# Patient Record
Sex: Male | Born: 1940 | Race: Black or African American | Hispanic: No | Smoking: Former smoker
Health system: Southern US, Community
[De-identification: ages and names within clinical notes are randomized; demographics above are authoritative.]

## PROBLEM LIST (undated history)

## (undated) DIAGNOSIS — R569 Unspecified convulsions: Secondary | ICD-10-CM

## (undated) DIAGNOSIS — J449 Chronic obstructive pulmonary disease, unspecified: Secondary | ICD-10-CM

## (undated) DIAGNOSIS — C8590 Non-Hodgkin lymphoma, unspecified, unspecified site: Secondary | ICD-10-CM

## (undated) DIAGNOSIS — M109 Gout, unspecified: Secondary | ICD-10-CM

## (undated) DIAGNOSIS — R972 Elevated prostate specific antigen [PSA]: Secondary | ICD-10-CM

## (undated) DIAGNOSIS — N529 Male erectile dysfunction, unspecified: Secondary | ICD-10-CM

## (undated) DIAGNOSIS — I10 Essential (primary) hypertension: Secondary | ICD-10-CM

## (undated) DIAGNOSIS — C801 Malignant (primary) neoplasm, unspecified: Secondary | ICD-10-CM

## (undated) DIAGNOSIS — J45909 Unspecified asthma, uncomplicated: Secondary | ICD-10-CM

## (undated) DIAGNOSIS — N400 Enlarged prostate without lower urinary tract symptoms: Secondary | ICD-10-CM

## (undated) HISTORY — DX: Elevated prostate specific antigen (PSA): R97.20

## (undated) HISTORY — PX: THORACENTESIS: SHX235

## (undated) HISTORY — DX: Chronic obstructive pulmonary disease, unspecified: J44.9

## (undated) HISTORY — DX: Gout, unspecified: M10.9

## (undated) HISTORY — DX: Non-Hodgkin lymphoma, unspecified, unspecified site: C85.90

## (undated) HISTORY — DX: Benign prostatic hyperplasia without lower urinary tract symptoms: N40.0

## (undated) HISTORY — PX: CYSTOSCOPY: SUR368

## (undated) HISTORY — DX: Unspecified convulsions: R56.9

## (undated) HISTORY — DX: Male erectile dysfunction, unspecified: N52.9

## (undated) HISTORY — PX: OTHER SURGICAL HISTORY: SHX169

---

## 2006-07-26 ENCOUNTER — Ambulatory Visit: Payer: Self-pay | Admitting: Internal Medicine

## 2006-07-28 ENCOUNTER — Ambulatory Visit: Payer: Self-pay | Admitting: Internal Medicine

## 2006-08-04 ENCOUNTER — Ambulatory Visit: Payer: Self-pay | Admitting: Internal Medicine

## 2006-08-07 ENCOUNTER — Ambulatory Visit: Payer: Self-pay | Admitting: Internal Medicine

## 2006-08-19 ENCOUNTER — Ambulatory Visit: Payer: Self-pay | Admitting: General Surgery

## 2006-08-19 ENCOUNTER — Other Ambulatory Visit: Payer: Self-pay

## 2006-08-21 ENCOUNTER — Ambulatory Visit: Payer: Self-pay | Admitting: General Surgery

## 2006-08-25 ENCOUNTER — Ambulatory Visit: Payer: Self-pay | Admitting: Internal Medicine

## 2006-09-25 ENCOUNTER — Ambulatory Visit: Payer: Self-pay | Admitting: Internal Medicine

## 2006-11-20 ENCOUNTER — Ambulatory Visit: Payer: Self-pay | Admitting: Internal Medicine

## 2009-03-22 ENCOUNTER — Ambulatory Visit: Payer: Self-pay | Admitting: Internal Medicine

## 2009-03-27 ENCOUNTER — Ambulatory Visit: Payer: Self-pay | Admitting: Internal Medicine

## 2009-03-29 ENCOUNTER — Ambulatory Visit: Payer: Self-pay | Admitting: Internal Medicine

## 2009-03-30 ENCOUNTER — Ambulatory Visit: Payer: Self-pay | Admitting: Internal Medicine

## 2009-04-10 ENCOUNTER — Ambulatory Visit: Payer: Self-pay | Admitting: General Surgery

## 2009-04-10 ENCOUNTER — Ambulatory Visit: Payer: Self-pay | Admitting: Internal Medicine

## 2009-04-11 ENCOUNTER — Ambulatory Visit: Payer: Self-pay | Admitting: General Surgery

## 2009-04-12 ENCOUNTER — Ambulatory Visit: Payer: Self-pay | Admitting: Internal Medicine

## 2009-04-12 ENCOUNTER — Ambulatory Visit: Payer: Self-pay | Admitting: General Surgery

## 2009-04-13 ENCOUNTER — Inpatient Hospital Stay: Payer: Self-pay | Admitting: Internal Medicine

## 2009-04-24 ENCOUNTER — Ambulatory Visit: Payer: Self-pay | Admitting: Internal Medicine

## 2009-05-25 ENCOUNTER — Ambulatory Visit: Payer: Self-pay | Admitting: Internal Medicine

## 2009-06-22 ENCOUNTER — Ambulatory Visit: Payer: Self-pay | Admitting: Internal Medicine

## 2009-06-24 ENCOUNTER — Ambulatory Visit: Payer: Self-pay | Admitting: Internal Medicine

## 2009-07-25 ENCOUNTER — Ambulatory Visit: Payer: Self-pay | Admitting: Internal Medicine

## 2009-08-24 ENCOUNTER — Ambulatory Visit: Payer: Self-pay | Admitting: Internal Medicine

## 2009-08-31 ENCOUNTER — Ambulatory Visit: Payer: Self-pay | Admitting: Internal Medicine

## 2009-09-24 ENCOUNTER — Ambulatory Visit: Payer: Self-pay | Admitting: Internal Medicine

## 2009-10-25 ENCOUNTER — Ambulatory Visit: Payer: Self-pay | Admitting: Internal Medicine

## 2009-11-24 ENCOUNTER — Ambulatory Visit: Payer: Self-pay | Admitting: Internal Medicine

## 2009-12-03 ENCOUNTER — Ambulatory Visit: Payer: Self-pay | Admitting: Internal Medicine

## 2009-12-25 ENCOUNTER — Ambulatory Visit: Payer: Self-pay | Admitting: Internal Medicine

## 2010-02-06 ENCOUNTER — Ambulatory Visit: Payer: Self-pay | Admitting: Internal Medicine

## 2010-02-24 ENCOUNTER — Ambulatory Visit: Payer: Self-pay | Admitting: Internal Medicine

## 2010-04-03 ENCOUNTER — Ambulatory Visit: Payer: Self-pay | Admitting: Internal Medicine

## 2010-04-25 ENCOUNTER — Ambulatory Visit: Payer: Self-pay | Admitting: Internal Medicine

## 2010-05-29 ENCOUNTER — Ambulatory Visit: Payer: Self-pay | Admitting: Internal Medicine

## 2010-06-04 ENCOUNTER — Ambulatory Visit: Payer: Self-pay | Admitting: Internal Medicine

## 2010-06-25 ENCOUNTER — Ambulatory Visit: Payer: Self-pay | Admitting: Internal Medicine

## 2010-07-26 ENCOUNTER — Ambulatory Visit: Payer: Self-pay | Admitting: Internal Medicine

## 2010-09-26 ENCOUNTER — Ambulatory Visit: Payer: Self-pay | Admitting: Internal Medicine

## 2010-10-26 ENCOUNTER — Ambulatory Visit: Payer: Self-pay | Admitting: Internal Medicine

## 2010-11-25 ENCOUNTER — Ambulatory Visit: Payer: Self-pay | Admitting: Internal Medicine

## 2011-01-21 ENCOUNTER — Ambulatory Visit: Payer: Self-pay | Admitting: Internal Medicine

## 2011-01-25 ENCOUNTER — Ambulatory Visit: Payer: Self-pay | Admitting: Internal Medicine

## 2011-03-19 ENCOUNTER — Ambulatory Visit: Payer: Self-pay | Admitting: Internal Medicine

## 2011-03-19 LAB — CBC CANCER CENTER
Basophil #: 0.1 x10 3/mm (ref 0.0–0.1)
Eosinophil #: 0.4 x10 3/mm (ref 0.0–0.7)
HCT: 43 % (ref 40.0–52.0)
Lymphocyte #: 1.2 x10 3/mm (ref 1.0–3.6)
Lymphocyte %: 14.3 %
MCHC: 33.6 g/dL (ref 32.0–36.0)
MCV: 93 fL (ref 80–100)
Monocyte %: 9.5 %
Neutrophil #: 5.9 x10 3/mm (ref 1.4–6.5)
Platelet: 197 x10 3/mm (ref 150–440)
RBC: 4.64 10*6/uL (ref 4.40–5.90)
RDW: 14.7 % — ABNORMAL HIGH (ref 11.5–14.5)

## 2011-03-19 LAB — COMPREHENSIVE METABOLIC PANEL
Albumin: 3.6 g/dL (ref 3.4–5.0)
Alkaline Phosphatase: 102 U/L (ref 50–136)
BUN: 9 mg/dL (ref 7–18)
Calcium, Total: 8.7 mg/dL (ref 8.5–10.1)
Glucose: 117 mg/dL — ABNORMAL HIGH (ref 65–99)
Potassium: 2.7 mmol/L — ABNORMAL LOW (ref 3.5–5.1)
SGOT(AST): 19 U/L (ref 15–37)
SGPT (ALT): 29 U/L
Total Protein: 7 g/dL (ref 6.4–8.2)

## 2011-03-19 LAB — LACTATE DEHYDROGENASE: LDH: 146 U/L (ref 87–241)

## 2011-03-26 LAB — POTASSIUM: Potassium: 4 mmol/L (ref 3.5–5.1)

## 2011-03-28 ENCOUNTER — Ambulatory Visit: Payer: Self-pay | Admitting: Internal Medicine

## 2011-04-01 ENCOUNTER — Other Ambulatory Visit: Payer: Self-pay | Admitting: Gastroenterology

## 2011-04-02 LAB — CLOSTRIDIUM DIFFICILE BY PCR

## 2011-04-04 LAB — STOOL CULTURE

## 2011-04-05 LAB — WBCS, STOOL

## 2011-04-21 ENCOUNTER — Ambulatory Visit: Payer: Self-pay | Admitting: Gastroenterology

## 2011-05-14 ENCOUNTER — Ambulatory Visit: Payer: Self-pay | Admitting: Internal Medicine

## 2011-05-14 LAB — COMPREHENSIVE METABOLIC PANEL
Alkaline Phosphatase: 107 U/L (ref 50–136)
Anion Gap: 9 (ref 7–16)
Calcium, Total: 8.4 mg/dL — ABNORMAL LOW (ref 8.5–10.1)
Co2: 28 mmol/L (ref 21–32)
Creatinine: 1.37 mg/dL — ABNORMAL HIGH (ref 0.60–1.30)
EGFR (African American): >60
Osmolality: 285 (ref 275–301)
Sodium: 144 mmol/L (ref 136–145)
Total Protein: 7 g/dL (ref 6.4–8.2)

## 2011-05-14 LAB — CBC CANCER CENTER
Basophil %: 0.3 %
HGB: 13.4 g/dL (ref 13.0–18.0)
Lymphocyte #: 1 x10 3/mm (ref 1.0–3.6)
Lymphocyte %: 12.3 %
MCH: 31 pg (ref 26.0–34.0)
MCHC: 33.2 g/dL (ref 32.0–36.0)
MCV: 93 fL (ref 80–100)
Monocyte %: 12.1 %
Neutrophil #: 5.7 x10 3/mm (ref 1.4–6.5)
Platelet: 183 x10 3/mm (ref 150–440)
RBC: 4.32 10*6/uL — ABNORMAL LOW (ref 4.40–5.90)

## 2011-05-19 ENCOUNTER — Ambulatory Visit: Payer: Self-pay | Admitting: Internal Medicine

## 2011-05-21 LAB — POTASSIUM: Potassium: 3 mmol/L — ABNORMAL LOW (ref 3.5–5.1)

## 2011-05-26 ENCOUNTER — Ambulatory Visit: Payer: Self-pay | Admitting: Internal Medicine

## 2011-05-26 LAB — CREATININE, SERUM: Creatinine: 1.33 mg/dL — ABNORMAL HIGH (ref 0.60–1.30)

## 2011-05-27 LAB — PSA: PSA: 5 ng/mL — ABNORMAL HIGH (ref 0.0–4.0)

## 2011-06-25 ENCOUNTER — Ambulatory Visit: Payer: Self-pay | Admitting: Internal Medicine

## 2011-07-16 LAB — CREATININE, SERUM
Creatinine: 1.37 mg/dL — ABNORMAL HIGH (ref 0.60–1.30)
EGFR (African American): 59 — ABNORMAL LOW
EGFR (Non-African Amer.): 51 — ABNORMAL LOW

## 2011-07-16 LAB — CBC CANCER CENTER
Basophil %: 0.2 %
Eosinophil %: 2.9 %
HCT: 43 % (ref 40.0–52.0)
HGB: 14.1 g/dL (ref 13.0–18.0)
Lymphocyte #: 1.1 x10 3/mm (ref 1.0–3.6)
Lymphocyte %: 13.2 %
MCV: 94 fL (ref 80–100)
Monocyte %: 7.1 %
Neutrophil %: 76.6 %
RDW: 14 % (ref 11.5–14.5)

## 2011-07-16 LAB — LACTATE DEHYDROGENASE: LDH: 139 U/L (ref 87–241)

## 2011-07-26 ENCOUNTER — Ambulatory Visit: Payer: Self-pay | Admitting: Internal Medicine

## 2011-09-10 ENCOUNTER — Ambulatory Visit: Payer: Self-pay | Admitting: Internal Medicine

## 2011-09-10 LAB — CBC CANCER CENTER
Basophil #: 0 x10 3/mm (ref 0.0–0.1)
Eosinophil #: 0.5 x10 3/mm (ref 0.0–0.7)
Eosinophil %: 4.9 %
MCH: 30.6 pg (ref 26.0–34.0)
MCV: 95 fL (ref 80–100)
Monocyte #: 1 x10 3/mm (ref 0.2–1.0)
Neutrophil %: 70.5 %
Platelet: 176 x10 3/mm (ref 150–440)
RBC: 4.7 10*6/uL (ref 4.40–5.90)
RDW: 14.3 % (ref 11.5–14.5)

## 2011-09-10 LAB — URIC ACID: Uric Acid: 5.3 mg/dL (ref 3.5–7.2)

## 2011-09-10 LAB — CREATININE, SERUM: EGFR (Non-African Amer.): 49 — ABNORMAL LOW

## 2011-09-25 ENCOUNTER — Ambulatory Visit: Payer: Self-pay | Admitting: Internal Medicine

## 2011-11-12 ENCOUNTER — Ambulatory Visit: Payer: Self-pay | Admitting: Internal Medicine

## 2011-11-12 LAB — CBC CANCER CENTER
Basophil #: 0 x10 3/mm (ref 0.0–0.1)
Eosinophil #: 0.6 x10 3/mm (ref 0.0–0.7)
HCT: 42 % (ref 40.0–52.0)
Lymphocyte #: 1.1 x10 3/mm (ref 1.0–3.6)
MCH: 30.7 pg (ref 26.0–34.0)
MCV: 94 fL (ref 80–100)
Monocyte #: 0.7 x10 3/mm (ref 0.2–1.0)
Monocyte %: 8.5 %
Platelet: 154 x10 3/mm (ref 150–440)
RDW: 13.6 % (ref 11.5–14.5)
WBC: 8.3 x10 3/mm (ref 3.8–10.6)

## 2011-11-12 LAB — TSH: Thyroid Stimulating Horm: 0.89 u[IU]/mL

## 2011-11-12 LAB — CREATININE, SERUM
Creatinine: 1.45 mg/dL — ABNORMAL HIGH (ref 0.60–1.30)
EGFR (African American): 55 — ABNORMAL LOW
EGFR (Non-African Amer.): 48 — ABNORMAL LOW

## 2011-11-12 LAB — HEPATIC FUNCTION PANEL A (ARMC)
Albumin: 3.7 g/dL (ref 3.4–5.0)
Bilirubin, Direct: 0.2 mg/dL (ref 0.00–0.20)
SGOT(AST): 28 U/L (ref 15–37)

## 2011-11-12 LAB — GLUCOSE, RANDOM: Glucose: 102 mg/dL — ABNORMAL HIGH (ref 65–99)

## 2011-11-12 LAB — HEMOGLOBIN A1C: Hemoglobin A1C: 5.4 % (ref 4.2–6.3)

## 2011-11-17 ENCOUNTER — Ambulatory Visit: Payer: Self-pay | Admitting: Internal Medicine

## 2011-11-25 ENCOUNTER — Ambulatory Visit: Payer: Self-pay | Admitting: Internal Medicine

## 2011-12-26 ENCOUNTER — Ambulatory Visit: Payer: Self-pay | Admitting: Internal Medicine

## 2012-01-23 ENCOUNTER — Emergency Department: Payer: Self-pay | Admitting: Emergency Medicine

## 2012-02-04 ENCOUNTER — Ambulatory Visit: Payer: Self-pay | Admitting: Internal Medicine

## 2012-02-04 LAB — CBC CANCER CENTER
Basophil #: 0 x10 3/mm (ref 0.0–0.1)
Eosinophil #: 0.2 x10 3/mm (ref 0.0–0.7)
Eosinophil %: 1.9 %
HCT: 40.6 % (ref 40.0–52.0)
HGB: 14.1 g/dL (ref 13.0–18.0)
Lymphocyte %: 15.3 %
MCHC: 34.8 g/dL (ref 32.0–36.0)
MCV: 92 fL (ref 80–100)
Neutrophil #: 6.2 x10 3/mm (ref 1.4–6.5)
Neutrophil %: 72.6 %
Platelet: 166 x10 3/mm (ref 150–440)
RBC: 4.41 10*6/uL (ref 4.40–5.90)
RDW: 13 % (ref 11.5–14.5)
WBC: 8.5 x10 3/mm (ref 3.8–10.6)

## 2012-02-04 LAB — CREATININE, SERUM
Creatinine: 1.6 mg/dL — ABNORMAL HIGH (ref 0.60–1.30)
EGFR (African American): 49 — ABNORMAL LOW
EGFR (Non-African Amer.): 42 — ABNORMAL LOW

## 2012-02-04 LAB — URIC ACID: Uric Acid: 7.2 mg/dL (ref 3.5–7.2)

## 2012-02-25 ENCOUNTER — Ambulatory Visit: Payer: Self-pay | Admitting: Internal Medicine

## 2012-03-27 ENCOUNTER — Ambulatory Visit: Payer: Self-pay | Admitting: Internal Medicine

## 2012-03-31 LAB — CREATININE, SERUM
Creatinine: 0.97 mg/dL (ref 0.60–1.30)
EGFR (African American): >60
EGFR (Non-African Amer.): >60

## 2012-03-31 LAB — CBC CANCER CENTER
Basophil %: 0.4 %
Eosinophil #: 0 x10 3/mm (ref 0.0–0.7)
HCT: 37.6 % — ABNORMAL LOW (ref 40.0–52.0)
HGB: 12.6 g/dL — ABNORMAL LOW (ref 13.0–18.0)
Lymphocyte #: 1.7 x10 3/mm (ref 1.0–3.6)
MCH: 31.2 pg (ref 26.0–34.0)
MCV: 93 fL (ref 80–100)
Monocyte %: 11.7 %
Neutrophil #: 5.8 x10 3/mm (ref 1.4–6.5)
Neutrophil %: 68.1 %
Platelet: 255 x10 3/mm (ref 150–440)
RBC: 4.04 10*6/uL — ABNORMAL LOW (ref 4.40–5.90)
RDW: 14.5 % (ref 11.5–14.5)

## 2012-03-31 LAB — LACTATE DEHYDROGENASE: LDH: 182 U/L (ref 85–241)

## 2012-04-14 LAB — IRON AND TIBC
Iron Bind.Cap.(Total): 315 ug/dL (ref 250–450)
Iron Saturation: 33 %
Unbound Iron-Bind.Cap.: 212 ug/dL

## 2012-04-14 LAB — CREATININE, SERUM
EGFR (African American): >60
EGFR (Non-African Amer.): 57 — ABNORMAL LOW

## 2012-04-24 ENCOUNTER — Ambulatory Visit: Payer: Self-pay | Admitting: Internal Medicine

## 2012-05-25 ENCOUNTER — Ambulatory Visit: Payer: Self-pay | Admitting: Internal Medicine

## 2012-05-26 LAB — CBC CANCER CENTER
Basophil #: 0 x10 3/mm (ref 0.0–0.1)
Basophil %: 0.4 %
Eosinophil %: 4.3 %
HCT: 43.2 % (ref 40.0–52.0)
HGB: 14.2 g/dL (ref 13.0–18.0)
MCHC: 32.8 g/dL (ref 32.0–36.0)
MCV: 92 fL (ref 80–100)
Monocyte %: 8.5 %
Neutrophil #: 6 x10 3/mm (ref 1.4–6.5)
WBC: 8.8 x10 3/mm (ref 3.8–10.6)

## 2012-05-26 LAB — POTASSIUM: Potassium: 4 mmol/L (ref 3.5–5.1)

## 2012-05-26 LAB — LACTATE DEHYDROGENASE: LDH: 148 U/L (ref 85–241)

## 2012-05-26 LAB — CREATININE, SERUM
Creatinine: 1.49 mg/dL — ABNORMAL HIGH (ref 0.60–1.30)
EGFR (African American): 53 — ABNORMAL LOW
EGFR (Non-African Amer.): 46 — ABNORMAL LOW

## 2012-06-24 ENCOUNTER — Ambulatory Visit: Payer: Self-pay | Admitting: Internal Medicine

## 2012-06-25 LAB — CREATININE, SERUM: EGFR (Non-African Amer.): >60

## 2012-06-25 LAB — CANCER CTR PLATELET CT: Platelet: 172 x10 3/mm (ref 150–440)

## 2012-06-26 LAB — PSA: PSA: 3.1 ng/mL (ref 0.0–4.0)

## 2012-07-15 ENCOUNTER — Ambulatory Visit: Payer: Self-pay | Admitting: Physician Assistant

## 2012-07-25 ENCOUNTER — Ambulatory Visit: Payer: Self-pay | Admitting: Internal Medicine

## 2012-08-08 ENCOUNTER — Observation Stay: Payer: Self-pay

## 2012-08-08 LAB — TROPONIN I: Troponin-I: <0.02 ng/mL

## 2012-08-08 LAB — COMPREHENSIVE METABOLIC PANEL
Alkaline Phosphatase: 71 U/L (ref 50–136)
Anion Gap: 9 (ref 7–16)
BUN: 14 mg/dL (ref 7–18)
Bilirubin,Total: 1.3 mg/dL — ABNORMAL HIGH (ref 0.2–1.0)
Calcium, Total: 8.4 mg/dL — ABNORMAL LOW (ref 8.5–10.1)
Chloride: 110 mmol/L — ABNORMAL HIGH (ref 98–107)
Co2: 23 mmol/L (ref 21–32)
Creatinine: 1.6 mg/dL — ABNORMAL HIGH (ref 0.60–1.30)
EGFR (Non-African Amer.): 42 — ABNORMAL LOW
Glucose: 83 mg/dL (ref 65–99)
Potassium: 2.4 mmol/L — CL (ref 3.5–5.1)
Sodium: 142 mmol/L (ref 136–145)
Total Protein: 6.9 g/dL (ref 6.4–8.2)

## 2012-08-08 LAB — URINALYSIS, COMPLETE
Bilirubin,UR: NEGATIVE
Glucose,UR: NEGATIVE mg/dL (ref 0–75)
Ketone: NEGATIVE
Nitrite: NEGATIVE
Ph: 6 (ref 4.5–8.0)
Specific Gravity: 1.01 (ref 1.003–1.030)
WBC UR: 2 /HPF (ref 0–5)

## 2012-08-08 LAB — CBC
HCT: 39.5 % — ABNORMAL LOW (ref 40.0–52.0)
MCV: 93 fL (ref 80–100)
RBC: 4.25 10*6/uL — ABNORMAL LOW (ref 4.40–5.90)
RDW: 15.2 % — ABNORMAL HIGH (ref 11.5–14.5)

## 2012-08-08 LAB — CK-MB: CK-MB: 2.8 ng/mL (ref 0.5–3.6)

## 2012-08-08 LAB — PRO B NATRIURETIC PEPTIDE: B-Type Natriuretic Peptide: 284 pg/mL — ABNORMAL HIGH (ref 0–125)

## 2012-08-08 LAB — CK: CK, Total: 152 U/L (ref 35–232)

## 2012-08-09 LAB — BASIC METABOLIC PANEL
Anion Gap: 8 (ref 7–16)
BUN: 12 mg/dL (ref 7–18)
Co2: 24 mmol/L (ref 21–32)
Creatinine: 1.42 mg/dL — ABNORMAL HIGH (ref 0.60–1.30)
EGFR (Non-African Amer.): 49 — ABNORMAL LOW
Glucose: 91 mg/dL (ref 65–99)
Osmolality: 290 (ref 275–301)
Sodium: 146 mmol/L — ABNORMAL HIGH (ref 136–145)

## 2012-08-09 LAB — CK-MB
CK-MB: 2.4 ng/mL (ref 0.5–3.6)
CK-MB: 2.8 ng/mL (ref 0.5–3.6)

## 2012-08-09 LAB — TROPONIN I
Troponin-I: 0.02 ng/mL
Troponin-I: <0.02 ng/mL

## 2012-09-01 ENCOUNTER — Ambulatory Visit: Payer: Self-pay | Admitting: Internal Medicine

## 2012-09-03 LAB — CBC CANCER CENTER
Basophil %: 0.5 %
Eosinophil #: 0.4 x10 3/mm (ref 0.0–0.7)
Eosinophil %: 3.8 %
HCT: 42.5 % (ref 40.0–52.0)
Lymphocyte #: 1.3 x10 3/mm (ref 1.0–3.6)
Lymphocyte %: 13.1 %
MCH: 33.3 pg (ref 26.0–34.0)
MCHC: 35.2 g/dL (ref 32.0–36.0)
MCV: 95 fL (ref 80–100)
Monocyte #: 0.8 x10 3/mm (ref 0.2–1.0)
Monocyte %: 8.4 %
Neutrophil #: 7.2 x10 3/mm — ABNORMAL HIGH (ref 1.4–6.5)
Neutrophil %: 74.2 %
Platelet: 185 x10 3/mm (ref 150–440)
RBC: 4.49 10*6/uL (ref 4.40–5.90)
RDW: 14.5 % (ref 11.5–14.5)
WBC: 9.7 x10 3/mm (ref 3.8–10.6)

## 2012-09-03 LAB — CREATININE, SERUM
EGFR (African American): 47 — ABNORMAL LOW
EGFR (Non-African Amer.): 41 — ABNORMAL LOW

## 2012-09-03 LAB — LACTATE DEHYDROGENASE: LDH: 140 U/L (ref 85–241)

## 2012-09-10 ENCOUNTER — Ambulatory Visit: Payer: Self-pay | Admitting: Internal Medicine

## 2012-09-17 LAB — BASIC METABOLIC PANEL
BUN: 18 mg/dL (ref 7–18)
Calcium, Total: 9 mg/dL (ref 8.5–10.1)
Chloride: 103 mmol/L (ref 98–107)
Creatinine: 1.51 mg/dL — ABNORMAL HIGH (ref 0.60–1.30)
EGFR (African American): 52 — ABNORMAL LOW
EGFR (Non-African Amer.): 45 — ABNORMAL LOW
Glucose: 136 mg/dL — ABNORMAL HIGH (ref 65–99)
Potassium: 4.8 mmol/L (ref 3.5–5.1)

## 2012-09-24 ENCOUNTER — Ambulatory Visit: Payer: Self-pay | Admitting: Internal Medicine

## 2012-10-15 ENCOUNTER — Emergency Department: Payer: Self-pay | Admitting: Unknown Physician Specialty

## 2012-10-15 LAB — CBC CANCER CENTER
Basophil %: 0.5 %
HCT: 38.2 % — ABNORMAL LOW (ref 40.0–52.0)
Lymphocyte #: 1.2 x10 3/mm (ref 1.0–3.6)
Lymphocyte %: 15.8 %
MCHC: 34.8 g/dL (ref 32.0–36.0)
MCV: 96 fL (ref 80–100)
Monocyte #: 0.5 x10 3/mm (ref 0.2–1.0)
Monocyte %: 6.6 %
Neutrophil %: 72.2 %
Platelet: 168 x10 3/mm (ref 150–440)
RDW: 14.1 % (ref 11.5–14.5)
WBC: 7.7 x10 3/mm (ref 3.8–10.6)

## 2012-10-15 LAB — COMPREHENSIVE METABOLIC PANEL
Albumin: 3.5 g/dL (ref 3.4–5.0)
BUN: 13 mg/dL (ref 7–18)
Calcium, Total: 8.6 mg/dL (ref 8.5–10.1)
Chloride: 112 mmol/L — ABNORMAL HIGH (ref 98–107)
Co2: 24 mmol/L (ref 21–32)
Creatinine: 1.25 mg/dL (ref 0.60–1.30)
EGFR (African American): >60
EGFR (Non-African Amer.): 57 — ABNORMAL LOW
Glucose: 89 mg/dL (ref 65–99)
Osmolality: 279 (ref 275–301)
SGOT(AST): 37 U/L (ref 15–37)
SGPT (ALT): 42 U/L (ref 12–78)
Sodium: 140 mmol/L (ref 136–145)

## 2012-10-15 LAB — LACTATE DEHYDROGENASE: LDH: 173 U/L (ref 85–241)

## 2012-10-16 ENCOUNTER — Emergency Department: Payer: Self-pay | Admitting: Emergency Medicine

## 2012-10-16 ENCOUNTER — Ambulatory Visit: Payer: Self-pay | Admitting: Neurology

## 2012-10-21 ENCOUNTER — Ambulatory Visit: Payer: Self-pay

## 2012-10-21 LAB — CREATININE, SERUM: EGFR (Non-African Amer.): 52 — ABNORMAL LOW

## 2012-12-10 ENCOUNTER — Ambulatory Visit: Payer: Self-pay | Admitting: Internal Medicine

## 2012-12-10 LAB — CBC CANCER CENTER
Basophil #: 0 x10 3/mm (ref 0.0–0.1)
Basophil %: 0.3 %
Eosinophil #: 0.3 x10 3/mm (ref 0.0–0.7)
Eosinophil %: 3 %
HCT: 40 % (ref 40.0–52.0)
HGB: 13.7 g/dL (ref 13.0–18.0)
Lymphocyte #: 1.3 x10 3/mm (ref 1.0–3.6)
Lymphocyte %: 15.8 %
MCH: 33.1 pg (ref 26.0–34.0)
MCHC: 34.1 g/dL (ref 32.0–36.0)
MCV: 97 fL (ref 80–100)
Monocyte #: 0.7 x10 3/mm (ref 0.2–1.0)
Monocyte %: 8.3 %
Neutrophil #: 6.1 x10 3/mm (ref 1.4–6.5)
Neutrophil %: 72.6 %
Platelet: 172 x10 3/mm (ref 150–440)
RBC: 4.13 10*6/uL — ABNORMAL LOW (ref 4.40–5.90)
RDW: 14 % (ref 11.5–14.5)
WBC: 8.4 x10 3/mm (ref 3.8–10.6)

## 2012-12-10 LAB — CREATININE, SERUM
Creatinine: 1.36 mg/dL — ABNORMAL HIGH (ref 0.60–1.30)
EGFR (African American): 59 — ABNORMAL LOW
EGFR (Non-African Amer.): 51 — ABNORMAL LOW

## 2012-12-10 LAB — LACTATE DEHYDROGENASE: LDH: 137 U/L (ref 85–241)

## 2012-12-25 ENCOUNTER — Ambulatory Visit: Payer: Self-pay | Admitting: Internal Medicine

## 2013-01-24 ENCOUNTER — Ambulatory Visit: Payer: Self-pay | Admitting: Internal Medicine

## 2013-02-03 LAB — CREATININE, SERUM
Creatinine: 1.5 mg/dL — ABNORMAL HIGH (ref 0.60–1.30)
EGFR (Non-African Amer.): 45 — ABNORMAL LOW

## 2013-02-03 LAB — LACTATE DEHYDROGENASE: LDH: 150 U/L (ref 85–241)

## 2013-02-03 LAB — CBC CANCER CENTER
Basophil #: 0 x10 3/mm (ref 0.0–0.1)
Basophil %: 0.2 %
Eosinophil #: 0.2 x10 3/mm (ref 0.0–0.7)
HCT: 43 % (ref 40.0–52.0)
Lymphocyte #: 1.4 x10 3/mm (ref 1.0–3.6)
MCH: 31.7 pg (ref 26.0–34.0)
MCV: 97 fL (ref 80–100)
Monocyte %: 9.3 %
Neutrophil #: 5.9 x10 3/mm (ref 1.4–6.5)
Neutrophil %: 71.9 %
Platelet: 161 x10 3/mm (ref 150–440)
RDW: 13.9 % (ref 11.5–14.5)
WBC: 8.2 x10 3/mm (ref 3.8–10.6)

## 2013-02-03 LAB — URIC ACID: Uric Acid: 3.6 mg/dL (ref 3.5–7.2)

## 2013-02-24 ENCOUNTER — Ambulatory Visit: Payer: Self-pay | Admitting: Internal Medicine

## 2013-05-04 ENCOUNTER — Ambulatory Visit: Payer: Self-pay | Admitting: Internal Medicine

## 2013-05-05 LAB — CBC CANCER CENTER
Basophil #: 0.1 x10 3/mm (ref 0.0–0.1)
Basophil %: 0.8 %
EOS ABS: 0.1 x10 3/mm (ref 0.0–0.7)
Eosinophil %: 1.4 %
HCT: 41.1 % (ref 40.0–52.0)
HGB: 13.7 g/dL (ref 13.0–18.0)
LYMPHS PCT: 18.3 %
Lymphocyte #: 1.5 x10 3/mm (ref 1.0–3.6)
MCH: 32.3 pg (ref 26.0–34.0)
MCHC: 33.4 g/dL (ref 32.0–36.0)
MCV: 97 fL (ref 80–100)
Monocyte #: 0.8 x10 3/mm (ref 0.2–1.0)
Monocyte %: 10 %
Neutrophil #: 5.7 x10 3/mm (ref 1.4–6.5)
Neutrophil %: 69.5 %
PLATELETS: 166 x10 3/mm (ref 150–440)
RBC: 4.25 10*6/uL — ABNORMAL LOW (ref 4.40–5.90)
RDW: 14 % (ref 11.5–14.5)
WBC: 8.2 x10 3/mm (ref 3.8–10.6)

## 2013-05-05 LAB — HEPATIC FUNCTION PANEL A (ARMC)
Albumin: 3.6 g/dL (ref 3.4–5.0)
Alkaline Phosphatase: 67 U/L
BILIRUBIN TOTAL: 0.8 mg/dL (ref 0.2–1.0)
Bilirubin, Direct: 0.2 mg/dL (ref 0.00–0.20)
SGOT(AST): 27 U/L (ref 15–37)
SGPT (ALT): 34 U/L (ref 12–78)
Total Protein: 6.8 g/dL (ref 6.4–8.2)

## 2013-05-05 LAB — CREATININE, SERUM
Creatinine: 1.39 mg/dL — ABNORMAL HIGH (ref 0.60–1.30)
EGFR (African American): 57 — ABNORMAL LOW
GFR CALC NON AF AMER: 50 — AB

## 2013-05-05 LAB — URIC ACID: Uric Acid: 3.8 mg/dL (ref 3.5–7.2)

## 2013-05-05 LAB — LACTATE DEHYDROGENASE: LDH: 142 U/L (ref 85–241)

## 2013-05-25 ENCOUNTER — Ambulatory Visit: Payer: Self-pay | Admitting: Internal Medicine

## 2013-06-01 DIAGNOSIS — I10 Essential (primary) hypertension: Secondary | ICD-10-CM | POA: Insufficient documentation

## 2013-06-01 DIAGNOSIS — J449 Chronic obstructive pulmonary disease, unspecified: Secondary | ICD-10-CM | POA: Insufficient documentation

## 2013-06-30 ENCOUNTER — Ambulatory Visit: Payer: Self-pay | Admitting: Internal Medicine

## 2013-06-30 LAB — CREATININE, SERUM
CREATININE: 1.27 mg/dL (ref 0.60–1.30)
EGFR (African American): >60
EGFR (Non-African Amer.): 55 — ABNORMAL LOW

## 2013-06-30 LAB — CBC CANCER CENTER
BASOS ABS: 0 x10 3/mm (ref 0.0–0.1)
BASOS PCT: 0.6 %
EOS ABS: 0.2 x10 3/mm (ref 0.0–0.7)
Eosinophil %: 2.1 %
HCT: 42.1 % (ref 40.0–52.0)
HGB: 14 g/dL (ref 13.0–18.0)
Lymphocyte #: 1.2 x10 3/mm (ref 1.0–3.6)
Lymphocyte %: 15.3 %
MCH: 32.2 pg (ref 26.0–34.0)
MCHC: 33.4 g/dL (ref 32.0–36.0)
MCV: 96 fL (ref 80–100)
MONO ABS: 0.7 x10 3/mm (ref 0.2–1.0)
MONOS PCT: 9.5 %
Neutrophil #: 5.6 x10 3/mm (ref 1.4–6.5)
Neutrophil %: 72.5 %
PLATELETS: 164 x10 3/mm (ref 150–440)
RBC: 4.36 10*6/uL — ABNORMAL LOW (ref 4.40–5.90)
RDW: 13.3 % (ref 11.5–14.5)
WBC: 7.7 x10 3/mm (ref 3.8–10.6)

## 2013-06-30 LAB — LACTATE DEHYDROGENASE: LDH: 176 U/L (ref 85–241)

## 2013-07-25 ENCOUNTER — Ambulatory Visit: Payer: Self-pay | Admitting: Internal Medicine

## 2013-08-03 DIAGNOSIS — R569 Unspecified convulsions: Secondary | ICD-10-CM | POA: Insufficient documentation

## 2013-08-03 DIAGNOSIS — R55 Syncope and collapse: Secondary | ICD-10-CM | POA: Insufficient documentation

## 2013-08-03 DIAGNOSIS — R6889 Other general symptoms and signs: Secondary | ICD-10-CM | POA: Insufficient documentation

## 2013-10-03 ENCOUNTER — Ambulatory Visit: Payer: Self-pay | Admitting: Internal Medicine

## 2013-10-03 LAB — CBC CANCER CENTER
Basophil #: 0 x10 3/mm (ref 0.0–0.1)
Basophil %: 0.4 %
Eosinophil #: 0.1 x10 3/mm (ref 0.0–0.7)
Eosinophil %: 1 %
HCT: 42.9 % (ref 40.0–52.0)
HGB: 14.3 g/dL (ref 13.0–18.0)
LYMPHS ABS: 1.4 x10 3/mm (ref 1.0–3.6)
LYMPHS PCT: 16.8 %
MCH: 32.9 pg (ref 26.0–34.0)
MCHC: 33.2 g/dL (ref 32.0–36.0)
MCV: 99 fL (ref 80–100)
MONOS PCT: 9.1 %
Monocyte #: 0.8 x10 3/mm (ref 0.2–1.0)
NEUTROS ABS: 6.2 x10 3/mm (ref 1.4–6.5)
Neutrophil %: 72.7 %
Platelet: 170 x10 3/mm (ref 150–440)
RBC: 4.33 10*6/uL — ABNORMAL LOW (ref 4.40–5.90)
RDW: 14.3 % (ref 11.5–14.5)
WBC: 8.5 x10 3/mm (ref 3.8–10.6)

## 2013-10-03 LAB — CREATININE, SERUM
Creatinine: 1.52 mg/dL — ABNORMAL HIGH (ref 0.60–1.30)
EGFR (African American): 52 — ABNORMAL LOW
GFR CALC NON AF AMER: 44 — AB

## 2013-10-03 LAB — LACTATE DEHYDROGENASE: LDH: 165 U/L (ref 85–241)

## 2013-10-03 LAB — POTASSIUM: POTASSIUM: 4.5 mmol/L (ref 3.5–5.1)

## 2013-10-25 ENCOUNTER — Ambulatory Visit: Payer: Self-pay | Admitting: Internal Medicine

## 2013-11-24 ENCOUNTER — Ambulatory Visit: Payer: Self-pay | Admitting: Internal Medicine

## 2014-01-02 ENCOUNTER — Ambulatory Visit: Payer: Self-pay | Admitting: Internal Medicine

## 2014-01-02 LAB — CBC CANCER CENTER
Basophil #: 0 x10 3/mm (ref 0.0–0.1)
Basophil %: 0.2 %
EOS PCT: 0.9 %
Eosinophil #: 0.1 x10 3/mm (ref 0.0–0.7)
HCT: 41.3 % (ref 40.0–52.0)
HGB: 13.8 g/dL (ref 13.0–18.0)
LYMPHS ABS: 1.2 x10 3/mm (ref 1.0–3.6)
Lymphocyte %: 14.9 %
MCH: 33.4 pg (ref 26.0–34.0)
MCHC: 33.4 g/dL (ref 32.0–36.0)
MCV: 100 fL (ref 80–100)
MONO ABS: 0.7 x10 3/mm (ref 0.2–1.0)
MONOS PCT: 8.8 %
NEUTROS PCT: 75.2 %
Neutrophil #: 6 x10 3/mm (ref 1.4–6.5)
PLATELETS: 121 x10 3/mm — AB (ref 150–440)
RBC: 4.14 10*6/uL — AB (ref 4.40–5.90)
RDW: 14.8 % — ABNORMAL HIGH (ref 11.5–14.5)
WBC: 8 x10 3/mm (ref 3.8–10.6)

## 2014-01-02 LAB — LACTATE DEHYDROGENASE: LDH: 251 U/L — AB (ref 85–241)

## 2014-01-02 LAB — CREATININE, SERUM
Creatinine: 1.22 mg/dL (ref 0.60–1.30)
EGFR (Non-African Amer.): >60

## 2014-01-02 LAB — URIC ACID: Uric Acid: 5 mg/dL (ref 3.5–7.2)

## 2014-01-11 LAB — PLATELET COUNT: PLATELETS: 192 10*3/uL (ref 150–440)

## 2014-01-24 ENCOUNTER — Ambulatory Visit: Payer: Self-pay | Admitting: Internal Medicine

## 2014-02-08 LAB — CBC CANCER CENTER
Basophil #: 0 x10 3/mm (ref 0.0–0.1)
Basophil %: 0.6 %
EOS ABS: 0.1 x10 3/mm (ref 0.0–0.7)
Eosinophil %: 1.1 %
HCT: 40.4 % (ref 40.0–52.0)
HGB: 13.5 g/dL (ref 13.0–18.0)
LYMPHS PCT: 18.6 %
Lymphocyte #: 1.4 x10 3/mm (ref 1.0–3.6)
MCH: 33.2 pg (ref 26.0–34.0)
MCHC: 33.5 g/dL (ref 32.0–36.0)
MCV: 99 fL (ref 80–100)
MONO ABS: 0.8 x10 3/mm (ref 0.2–1.0)
MONOS PCT: 10.2 %
Neutrophil #: 5.1 x10 3/mm (ref 1.4–6.5)
Neutrophil %: 69.5 %
Platelet: 182 x10 3/mm (ref 150–440)
RBC: 4.08 10*6/uL — AB (ref 4.40–5.90)
RDW: 14.8 % — AB (ref 11.5–14.5)
WBC: 7.4 x10 3/mm (ref 3.8–10.6)

## 2014-02-08 LAB — CREATININE, SERUM
CREATININE: 1.09 mg/dL (ref 0.60–1.30)
EGFR (African American): >60
EGFR (Non-African Amer.): >60

## 2014-02-08 LAB — LACTATE DEHYDROGENASE: LDH: 143 U/L (ref 85–241)

## 2014-02-09 ENCOUNTER — Emergency Department: Payer: Self-pay | Admitting: Emergency Medicine

## 2014-02-09 LAB — CBC
HCT: 41.7 % (ref 40.0–52.0)
HGB: 13.7 g/dL (ref 13.0–18.0)
MCH: 33.6 pg (ref 26.0–34.0)
MCHC: 32.9 g/dL (ref 32.0–36.0)
MCV: 102 fL — AB (ref 80–100)
PLATELETS: 186 10*3/uL (ref 150–440)
RBC: 4.09 10*6/uL — AB (ref 4.40–5.90)
RDW: 14.7 % — AB (ref 11.5–14.5)
WBC: 7.9 10*3/uL (ref 3.8–10.6)

## 2014-02-09 LAB — COMPREHENSIVE METABOLIC PANEL
ALK PHOS: 64 U/L
ANION GAP: 7 (ref 7–16)
AST: 31 U/L (ref 15–37)
Albumin: 3.6 g/dL (ref 3.4–5.0)
BUN: 11 mg/dL (ref 7–18)
Bilirubin,Total: 1 mg/dL (ref 0.2–1.0)
CREATININE: 1.08 mg/dL (ref 0.60–1.30)
Calcium, Total: 8.5 mg/dL (ref 8.5–10.1)
Chloride: 107 mmol/L (ref 98–107)
Co2: 27 mmol/L (ref 21–32)
EGFR (Non-African Amer.): >60
GLUCOSE: 83 mg/dL (ref 65–99)
Osmolality: 280 (ref 275–301)
POTASSIUM: 4 mmol/L (ref 3.5–5.1)
SGPT (ALT): 26 U/L
SODIUM: 141 mmol/L (ref 136–145)
Total Protein: 6.9 g/dL (ref 6.4–8.2)

## 2014-02-09 LAB — CK TOTAL AND CKMB (NOT AT ARMC)
CK, Total: 69 U/L (ref 39–308)
CK-MB: 1.6 ng/mL (ref 0.5–3.6)

## 2014-02-09 LAB — TROPONIN I

## 2014-02-24 ENCOUNTER — Ambulatory Visit: Payer: Self-pay | Admitting: Internal Medicine

## 2014-04-05 DIAGNOSIS — K589 Irritable bowel syndrome without diarrhea: Secondary | ICD-10-CM | POA: Insufficient documentation

## 2014-04-07 ENCOUNTER — Ambulatory Visit: Payer: Self-pay | Admitting: Internal Medicine

## 2014-04-25 ENCOUNTER — Ambulatory Visit
Admit: 2014-04-25 | Disposition: A | Discharge status: Home / Self Care | Payer: Self-pay | Attending: Internal Medicine | Admitting: Internal Medicine

## 2014-05-22 LAB — PLATELET COUNT: Platelet: 175 10*3/uL (ref 150–440)

## 2014-05-22 LAB — CREATININE, SERUM
Creatinine: 1.23 mg/dL
EGFR (Non-African Amer.): 57 — ABNORMAL LOW

## 2014-05-26 ENCOUNTER — Ambulatory Visit
Admit: 2014-05-26 | Disposition: A | Discharge status: Home / Self Care | Payer: Self-pay | Attending: Internal Medicine | Admitting: Internal Medicine

## 2014-06-09 ENCOUNTER — Other Ambulatory Visit: Payer: Self-pay | Admitting: Internal Medicine

## 2014-06-09 DIAGNOSIS — C8298 Follicular lymphoma, unspecified, lymph nodes of multiple sites: Secondary | ICD-10-CM

## 2014-06-16 NOTE — H&P (Signed)
PATIENT NAME:  Robert Weeks, Robert Weeks MR#:  [id number] DATE OF BIRTH:  1941-02-11  DATE OF ADMISSION:  08/08/2012  PRIMARY CARE PHYSICIAN:  Dr. Adrian Prows, [facility].   CHIEF COMPLAINT: Syncopal episode today.   HISTORY OF PRESENT ILLNESS: Mr. Hasbun is 74 year old African American gentleman with history of hypertension, chronic obstructive pulmonary disease and asthma, history of stage IV lymphoma and history of abdominal aortic aneurysm about 3 cm comes to the Emergency Room after he had a syncopal episode at [facility] while he was getting his prescriptions. He was down for about 15 to 20 minutes until he woke up while on route  to the Emergency Room. The patient is hemodynamically stable at this time, in sinus rhythm with blood pressure 138/73. He denies any chest pain or any presyncopal symptoms of dizziness, chest tightness or shortness of breath. He did not have any seizures that were witnessed.  The patient says his blood pressure has been labile at home. It runs anywhere from systolic [id number] to [id number] and it will drop down sometimes into the 120s. The patient has not had any syncopal episodes in the past.   PAST MEDICAL HISTORY: 1.  Lymphoma, stage IV.  2.  Chronic diarrhea. He has seen GI and has had colonoscopy.  3.  AAA 3.3 cm, follows with Dr. Lucky Cowboy.  4.  Seasonal allergies.  5.  Thyroid goiter.  6.  Asthma and chronic obstructive pulmonary disease.  7.  History of hematuria with abnormal PSA, has seen urology in the past.  8.  History of hypertension.   MEDICATIONS: 1.  Advair 250/50 1 puff b.i.d.  2.  Lisinopril 5 mg p.o. daily.  3.  Ventolin HFA 90 mcg, 2 puffs inhalation daily 4 times a day as needed.  4.  Klor-Con 10 mEq  2 tablets daily.   ALLERGIES: No known drug allergies.   SOCIAL HISTORY:  Widowed, lives with his girlfriend. Quit tobacco into 2008, drinks several beers a day and more on the weekend.   FAMILY HISTORY: Father lived into his late 60s died of aging  issues, mother died possibly due to heart attack.   REVIEW OF SYSTEMS:  CONSTITUTIONAL: No fever, fatigue, weakness.  EYES: No blurred or double vision. No glaucoma or cataracts.  ENT: No tinnitus, ear pain, hearing loss.  RESPIRATORY: No cough, wheeze, hemoptysis or chronic obstructive pulmonary disease.  CARDIOVASCULAR: No chest pain, orthopnea, edema. Positive for hypertension.  GASTROINTESTINAL: No nausea, vomiting. Positive for chronic diarrhea. No abdominal pain.  GENITOURINARY: No dysuria or hematuria.  ENDOCRINE: No polyuria, nocturia or thyroid problems.  HEMATOLOGY: No anemia or easy bruising.  SKIN: No acne or rash.  MUSCULOSKELETAL: Positive for arthritis.  NEUROLOGIC: No CVA, transient ischemic attack or dementia.  PSYCHIATRIC: No anxiety, depression or bipolar disorder.  All other systems reviewed and are negative.   PHYSICAL EXAMINATION: GENERAL: The patient is awake, alert, oriented x 3, not in acute distress.  VITAL SIGNS: He is afebrile. Pulse is a 78 and regular, blood pressure is 148/68, sats are 98% on room air.  HEENT: Atraumatic, normocephalic. PERRLA. EOM intact. Oral mucosa is moist.  NECK: Supple. No JVD. No carotid bruit.  LUNGS: Clear last clear to auscultation bilaterally. No rales, rhonchi, respiratory distress or labored breathing.  CARDIOVASCULAR: Both the heart sounds are normal. Rate, rhythm is regular. PMI not lateralized. Chest is nontender.  EXTREMITIES: Good pedal pulses, good femoral pulses. No lower extremity edema.  ABDOMEN: Soft, nontender. No organomegaly. Positive bowel sounds. No mass  felt.  NEUROLOGIC: Grossly intact cranial nerves II through XII. No motor or sensory deficits.  PSYCHIATRIC:  The patient is awake, alert, oriented x 3. SKIN:  Warm and dry.    LABORATORY, DIAGNOSTIC AND RADIOLOGIC DATA: EKG shows left anterior fascicular block, right bundle branch block.  We do not have any old EKGs to do  comparison.   Cardiac enzymes:  first set negative.   Potassium is 2.4, bilirubin is 1.3.   CT of the head is essentially negative.   CBC within normal limits. Troponin less than 0.02. B-type natriuretic peptide is 284.   Ultrasound of the aorta shows infrarenal abdominal aortic aneurysm measuring 3.3 cm.   ASSESSMENT: Mr. Wilke is a 74 year old with history of chronic obstructive pulmonary disease, asthma, hypertension, comes in with:  1.  Syncopal episode. The patient was found unresponsive at [facility] while he trying to pick up a prescriptions. Downtime was about 15 minutes, brought to the Emergency Room. He initially had transient elevated blood pressure; however, became very hemodynamically stable in the Emergency Room. The patient denies any presyncopal symptoms of chest pain, dizziness, nausea or vomiting. We will admit patient for overnight observation. Continue telemetry monitoring, cycle cardiac enzymes x 3. Continue neuro checks every 8 hours.  2.  Hypertension. Continue lisinopril.  3.  Hypokalemia. Replace IV and oral potassium.  4.  Chronic diarrhea. Lomotil as needed.  5.  Chronic obstructive pulmonary disease/asthma. Appears stable. Continue Ventolin and Advair.   6.  Deep vein thrombosis prophylaxis with heparin.   Above was discussed with the patient's daughter, who was present in the Emergency Room.   TIME SPENT: 50 minutes    ____________________________ Gus Height A. Posey Pronto, MD sap:cc D: 08/08/2012 22:07:06 ET T: 08/08/2012 23:47:46 ET JOB#: [id number]  cc: Shandi Godfrey A. Posey Pronto, MD, <Dictator> Cheral Marker. Ola Spurr, MD Ilda Basset MD ELECTRONICALLY SIGNED 08/18/2012 14:20
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
## 2014-06-16 NOTE — Discharge Summary (Signed)
PATIENT NAME:  Robert Weeks, Robert Weeks MR#:  [id number] DATE OF BIRTH:  1940/03/28  DATE OF ADMISSION:  08/08/2012 DATE OF DISCHARGE:  08/09/2012  DISPOSITION:  Discharged to home.  ADMISSION STATUS:  Observation.   DISCHARGE DIAGNOSES: 1.  Syncope.  2.  Hypokalemia.  3.  Hypertension.  4.  Chronic diarrhea.   HISTORY OF PRESENT ILLNESS: Please see admission history and physical. Briefly, this is a 74 year old gentleman with known chronic diarrhea as well as prior coma, in remission, who has a history of hypokalemia and had stopped his potassium pills. He was admitted after a syncopal episode at [facility].   HOSPITAL COURSE BY ISSUE:  1.  Syncope:  The patient had telemetry with no events. Cardiac enzymes were negative. Carotid Dopplers were negative. Echo cardiogram was pending. The patient had no further episodes and requested discharge home. He will be followed up as an outpatient. Consider Holter monitor. I suspect the syncope was an arrhythmia due to hypokalemia.  2.  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 He was given IV potassium as well as oral.  Magnesium was checked and was a little low, and he was started on that as well. The patient will follow up in 1 week's time to have a potassium level rechecked.  3.  Chronic diarrhea:  The patient had recent C. diff stool studies and O and P negative at the Clinic. He had a colonoscopy a year ago which was unrevealing. We will refer him as an outpatient back to GI for further evaluation of this chronic watery diarrhea.  4.  Hypertension:  He remains on his outpatient antihypertensives.   DISCHARGE DIET: Regular, low salt diet.   ACTIVITY: As tolerated.  FOLLOWUP:  The patient will follow up with Dr. Ola Spurr in 1 to 2 weeks. The patient is instructed to call if recurrent syncope, dizziness or other concerning findings.   TIME SPENT:   This discharge took 35  minutes.  ____________________________ Cheral Marker. Ola Spurr, MD dpf:cb D: 08/09/2012 14:55:31 ET T: 08/09/2012 22:54:58 ET JOB#: [id number]  cc: Cheral Marker. Ola Spurr, MD, <Dictator> Tyronza Happe Ola Spurr MD ELECTRONICALLY SIGNED 08/11/2012 17:30

## 2014-07-10 ENCOUNTER — Ambulatory Visit
Admission: RE | Admit: 2014-07-10 | Discharge: 2014-07-10 | Disposition: A | Discharge status: Home / Self Care | Payer: Medicare Other | Source: Ambulatory Visit | Attending: Internal Medicine | Admitting: Internal Medicine

## 2014-07-10 DIAGNOSIS — K8020 Calculus of gallbladder without cholecystitis without obstruction: Secondary | ICD-10-CM | POA: Diagnosis not present

## 2014-07-10 DIAGNOSIS — C8298 Follicular lymphoma, unspecified, lymph nodes of multiple sites: Secondary | ICD-10-CM | POA: Insufficient documentation

## 2014-07-10 DIAGNOSIS — I714 Abdominal aortic aneurysm, without rupture: Secondary | ICD-10-CM | POA: Diagnosis not present

## 2014-07-10 DIAGNOSIS — M5136 Other intervertebral disc degeneration, lumbar region: Secondary | ICD-10-CM | POA: Diagnosis not present

## 2014-07-10 HISTORY — DX: Malignant (primary) neoplasm, unspecified: C80.1

## 2014-07-10 HISTORY — DX: Unspecified asthma, uncomplicated: J45.909

## 2014-07-10 HISTORY — DX: Essential (primary) hypertension: I10

## 2014-07-17 ENCOUNTER — Other Ambulatory Visit: Payer: Self-pay

## 2014-07-17 ENCOUNTER — Ambulatory Visit: Payer: Self-pay | Admitting: Internal Medicine

## 2014-07-20 ENCOUNTER — Telehealth: Payer: Self-pay | Admitting: *Deleted

## 2014-07-20 NOTE — Telephone Encounter (Signed)
I left a message that I wanted to speak with him about his CT scan results and that I would give him a call back this afternoon.Robert KitchenMarland Weeks

## 2014-07-20 NOTE — Telephone Encounter (Signed)
Pt returned my call regarding his CT scan results. I informed him that he would be contacted tomorrow regarding a referral to Dr. Candace Cruise to rule out a rectal mass and then f/u with MD next week. Pt was very agreeable to this.Marland KitchenMarland Kitchen

## 2014-08-02 ENCOUNTER — Inpatient Hospital Stay: Payer: Medicare Other | Attending: Family Medicine | Admitting: Family Medicine

## 2014-08-02 ENCOUNTER — Inpatient Hospital Stay: Payer: Medicare Other

## 2014-08-02 ENCOUNTER — Encounter: Payer: Self-pay | Admitting: Family Medicine

## 2014-08-02 VITALS — BP 183/96 | HR 65 | Temp 97.9°F | Resp 16 | Wt 152.6 lb

## 2014-08-02 DIAGNOSIS — E049 Nontoxic goiter, unspecified: Secondary | ICD-10-CM | POA: Insufficient documentation

## 2014-08-02 DIAGNOSIS — K8020 Calculus of gallbladder without cholecystitis without obstruction: Secondary | ICD-10-CM | POA: Diagnosis not present

## 2014-08-02 DIAGNOSIS — R197 Diarrhea, unspecified: Secondary | ICD-10-CM | POA: Insufficient documentation

## 2014-08-02 DIAGNOSIS — J45909 Unspecified asthma, uncomplicated: Secondary | ICD-10-CM | POA: Insufficient documentation

## 2014-08-02 DIAGNOSIS — C8220 Follicular lymphoma grade III, unspecified, unspecified site: Secondary | ICD-10-CM

## 2014-08-02 DIAGNOSIS — Z8572 Personal history of non-Hodgkin lymphomas: Secondary | ICD-10-CM | POA: Insufficient documentation

## 2014-08-02 DIAGNOSIS — I10 Essential (primary) hypertension: Secondary | ICD-10-CM

## 2014-08-02 DIAGNOSIS — C8330 Diffuse large B-cell lymphoma, unspecified site: Secondary | ICD-10-CM

## 2014-08-02 DIAGNOSIS — N400 Enlarged prostate without lower urinary tract symptoms: Secondary | ICD-10-CM | POA: Diagnosis not present

## 2014-08-02 DIAGNOSIS — K5730 Diverticulosis of large intestine without perforation or abscess without bleeding: Secondary | ICD-10-CM | POA: Insufficient documentation

## 2014-08-02 DIAGNOSIS — I714 Abdominal aortic aneurysm, without rupture: Secondary | ICD-10-CM | POA: Insufficient documentation

## 2014-08-02 DIAGNOSIS — Z87891 Personal history of nicotine dependence: Secondary | ICD-10-CM | POA: Diagnosis not present

## 2014-08-02 DIAGNOSIS — C8590 Non-Hodgkin lymphoma, unspecified, unspecified site: Secondary | ICD-10-CM | POA: Insufficient documentation

## 2014-08-02 LAB — CBC WITH DIFFERENTIAL/PLATELET
BASOS ABS: 0 10*3/uL (ref 0–0.1)
Basophils Relative: 1 %
Eosinophils Absolute: 0.1 10*3/uL (ref 0–0.7)
Eosinophils Relative: 2 %
HEMATOCRIT: 39.7 % — AB (ref 40.0–52.0)
Hemoglobin: 13.1 g/dL (ref 13.0–18.0)
LYMPHS ABS: 1.1 10*3/uL (ref 1.0–3.6)
LYMPHS PCT: 18 %
MCH: 32.2 pg (ref 26.0–34.0)
MCHC: 32.9 g/dL (ref 32.0–36.0)
MCV: 97.8 fL (ref 80.0–100.0)
Monocytes Absolute: 0.6 10*3/uL (ref 0.2–1.0)
Monocytes Relative: 10 %
NEUTROS ABS: 4.3 10*3/uL (ref 1.4–6.5)
Neutrophils Relative %: 69 %
PLATELETS: 165 10*3/uL (ref 150–440)
RBC: 4.06 MIL/uL — ABNORMAL LOW (ref 4.40–5.90)
RDW: 13.8 % (ref 11.5–14.5)
WBC: 6.2 10*3/uL (ref 3.8–10.6)

## 2014-08-02 LAB — CREATININE, SERUM
Creatinine, Ser: 1.21 mg/dL (ref 0.61–1.24)
GFR calc Af Amer: >60 mL/min (ref >60)
GFR calc non Af Amer: 57 mL/min — ABNORMAL LOW (ref >60)

## 2014-08-02 LAB — LACTATE DEHYDROGENASE: LDH: 136 U/L (ref 98–192)

## 2014-08-02 MED ORDER — HEPARIN SOD (PORK) LOCK FLUSH 100 UNIT/ML IV SOLN
INTRAVENOUS | Status: AC
  Filled 2014-08-02: qty 5

## 2014-08-02 MED ORDER — SODIUM CHLORIDE 0.9 % IJ SOLN
10.0000 mL | Freq: Once | INTRAMUSCULAR | Status: AC
  Administered 2014-08-02: 10 mL via INTRAVENOUS
  Filled 2014-08-02: qty 10

## 2014-08-02 MED ORDER — HEPARIN SOD (PORK) LOCK FLUSH 100 UNIT/ML IV SOLN
500.0000 [IU] | Freq: Once | INTRAVENOUS | Status: AC
  Administered 2014-08-02: 500 [IU] via INTRAVENOUS

## 2014-08-02 NOTE — Progress Notes (Signed)
[place]  [phone]  [phone]     [place] Current DOB: March 18, 1940  MR#: [id number]  [id number]  Patient Care Team: Adrian Prows, MD as PCP - General (Infectious Diseases)  CHIEF COMPLAINT:  Chief Complaint  Patient presents with  . Follow-up    Pt is here today to discuss lab results. His GI consult with Dr. Candace Cruise is on 08/24/14...   Patient has history of stage IV follicular lymphoma, diffuse large B-cell lymphoma from 2011. Patient also has a history of a goiter that has previously shown up on PET scan. Is followed by GI for chronic diarrhea next appointment is scheduled with Dr. Candace Cruise on June 30. He is followed by vascular regarding AAA on a yearly basis. Patient has recently had a CT scan of abdomen and pelvis without contrast for lymphoma with comparison made to previous scans.  INTERVAL HISTORY:  Patient is here for continued evaluation regarding lymphoma. Patient was previously followed by Dr. Inez Pilgrim, last seen 05/22/2014.  REVIEW OF SYSTEMS:   Review of Systems  Constitutional: Negative for fever, chills, weight loss, malaise/fatigue and diaphoresis.  HENT: Negative for congestion, ear discharge, ear pain, hearing loss, nosebleeds, sore throat and tinnitus.   Eyes: Negative for blurred vision, double vision, photophobia, pain, discharge and redness.  Respiratory: Negative for cough, hemoptysis, sputum production, shortness of breath, wheezing and stridor.   Cardiovascular: Negative for chest pain, palpitations, orthopnea, claudication, leg swelling and PND.  Gastrointestinal: Negative for heartburn, nausea, vomiting, abdominal pain, diarrhea, constipation, blood in stool and melena.  Genitourinary: Negative.   Musculoskeletal: Negative.   Skin: Negative.   Neurological: Negative for dizziness, tingling, focal weakness, seizures, weakness and headaches.  Endo/Heme/Allergies: Does not bruise/bleed easily.  Psychiatric/Behavioral:  Negative for depression. The patient is not nervous/anxious and does not have insomnia.     As per HPI. Otherwise, a complete review of systems is negatve.  ONCOLOGY HISTORY:  No history exists.    PAST MEDICAL HISTORY: Past Medical History  Diagnosis Date  . Asthma   . Cancer     lymphom dx in 2011 in remission  . Hypertension     PAST SURGICAL HISTORY: No past surgical history on file.  FAMILY HISTORY No family history on file.  GYNECOLOGIC HISTORY:  No LMP for male patient.     ADVANCED DIRECTIVES:    HEALTH MAINTENANCE: History  Substance Use Topics  . Smoking status: Former Smoker    Quit date: 05/01/2009  . Smokeless tobacco: Not on file  . Alcohol Use: Not on file     Colonoscopy:  PAP:  Bone density:  Lipid panel:  Not on File  Current Outpatient Prescriptions  Medication Sig Dispense Refill  . ADVAIR DISKUS 250-50 MCG/DOSE AEPB     . KLOR-CON M20 20 MEQ tablet     . levETIRAcetam (KEPPRA) 750 MG tablet     . lisinopril (PRINIVIL,ZESTRIL) 5 MG tablet      No current facility-administered medications for this visit.    OBJECTIVE: BP 183/96 mmHg  Pulse 65  Temp(Src) 97.9 F (36.6 C) (Tympanic)  Resp 16  Wt 152 lb 8.9 oz (69.2 kg)   There is no height on file to calculate BMI.    ECOG FS:0 - Asymptomatic  General: Well-developed, well-nourished, no acute distress. Eyes: Pink conjunctiva, anicteric sclera. HEENT: Normocephalic, moist mucous membranes, clear oropharnyx. Lungs: Clear to auscultation bilaterally. Heart: Regular rate and rhythm. No rubs, murmurs, or gallops. Abdomen: Soft, nontender, nondistended. No organomegaly noted,  normoactive bowel sounds. Musculoskeletal: No edema, cyanosis, or clubbing. Neuro: Alert, answering all questions appropriately. Cranial nerves grossly intact. Skin: No rashes or petechiae noted. Psych: Normal affect. Lymphatics: No cervical, calvicular, axillary or inguinal LAD.   LAB RESULTS:       Component Value Date/Time   NA 141 02/09/2014 1359   K 4.0 02/09/2014 1359   CL 107 02/09/2014 1359   CO2 27 02/09/2014 1359   GLUCOSE 83 02/09/2014 1359   BUN 11 02/09/2014 1359   CREATININE 1.23 05/22/2014 0921   CALCIUM 8.5 02/09/2014 1359   PROT 6.9 02/09/2014 1359   ALBUMIN 3.6 02/09/2014 1359   AST 31 02/09/2014 1359   ALT 26 02/09/2014 1359   ALKPHOS 64 02/09/2014 1359   GFRNONAA 57* 05/22/2014 0921   GFRAA >60 05/22/2014 0921    No results found for: SPEP, UPEP  Lab Results  Component Value Date   WBC 6.2 08/02/2014   NEUTROABS 4.3 08/02/2014   HGB 13.1 08/02/2014   HCT 39.7* 08/02/2014   MCV 97.8 08/02/2014   PLT 165 08/02/2014      Chemistry      Component Value Date/Time   NA 141 02/09/2014 1359   K 4.0 02/09/2014 1359   CL 107 02/09/2014 1359   CO2 27 02/09/2014 1359   BUN 11 02/09/2014 1359   CREATININE 1.23 05/22/2014 0921      Component Value Date/Time   CALCIUM 8.5 02/09/2014 1359   ALKPHOS 64 02/09/2014 1359   AST 31 02/09/2014 1359   ALT 26 02/09/2014 1359       No results found for: LABCA2  No components found for: LABCA125  No results for input(s): INR in the last 168 hours.  No results found for: COLORURINE, APPEARANCEUR, LABSPEC, PHURINE, GLUCOSEU, HGBUR, BILIRUBINUR, KETONESUR, PROTEINUR, UROBILINOGEN, NITRITE, LEUKOCYTESUR  STUDIES: Ct Abdomen Pelvis Wo Contrast  07/10/2014   CLINICAL DATA:  Follow up for follicular lymphoma. Occasional diarrhea.  EXAM: CT ABDOMEN AND PELVIS WITHOUT CONTRAST  TECHNIQUE: Multidetector CT imaging of the abdomen and pelvis was performed following the standard protocol without IV contrast.  COMPARISON:  Multiple exams, including 01/10/2014  FINDINGS: Lower chest:  Unremarkable  Hepatobiliary: Gallstones noted with nitrogen gas phenomenon.  Pancreas: 9 mm fluid density lesion of the anterior pancreatic body, image 22 series 2.  Spleen: Unremarkable aside from a small accessory spleen.  Adrenals/Urinary  Tract: Bilateral stable fluid density renal lesions, largest exophytic from the left kidney lower pole measuring 2.8 by 3.1 cm, no change from prior.  Stomach/Bowel: Wall thickening in the distal rectum and at the anorectal junction. Descending colon diverticulosis.  Vascular/Lymphatic: Bilobed infrarenal abdominal aortic aneurysm, maximum AP diameter 3.7 cm. Continued stranding infiltration of the mesenteric root and retroperitoneum in the vicinity of the celiac trunk and superior mesenteric artery, without overt nodularity.  Reproductive: Enlarged prostate gland, 6.2 by 5.8 cm, slightly eccentric to the left side as before.  Other: No supplemental non-categorized findings.  Musculoskeletal: Bridging spurring of the left sacroiliac joint. Spurring along the acetabula and femoral heads. Diffuse disc bulges at all levels between L2 and S1 with loss of disc height at the L5-S1 level.  IMPRESSION: 1. Mesenteric root and retroperitoneal stranding without overt nodularity, characteristic of treated lymphoma and stable from the prior exam. 2. Cholelithiasis. 3. 9 mm fluid density lesion of the anterior pancreatic body, not appreciably changed 11/20/2006. 4. Bilateral fluid density lesions of the kidneys, favoring cysts. 5. Wall thickening at the anorectal junction. Consider correlation with  rectal exam to exclude tumor/abnormality in this vicinity. 6. Prominent prostate gland, slightly asymmetric to the left. 7. 3.7 cm bilobed infrarenal abdominal aortic aneurysm. Recommend followup by [place] in 2 years. This recommendation follows ACR consensus guidelines: White Paper of the ACR Incidental Findings Committee II on Vascular Findings. J Am Coll Radiol 2013; 10:789-794. 8. Lumbar degenerative disc disease.   Electronically Signed   By: Van Clines M.D.   On: 07/10/2014 09:12    ASSESSMENT:  Stage for follicular lymphoma, diffuse large B-cell  PLAN:  1. Lymphoma. Most recent CT scan of abdomen and pelvis without  contrast was on 07/10/2014. Noted to have mesenteric root and retroperitoneal stranding without overt nodularity, thought to be characteristic of treated lymphoma and stable. Noted to have a 9 mm fluid density lesion in the anterior pancreatic body, essentially unchanged since September 2008. Noted to have wall thickening at the anorectal junction. Patient has follow-up with GI on June 30 and this will be brought to his attention at that time. Also noted to have a prominent prostate gland which he follows with GU. Overall there is no clinical evidence of recurrent disease.  Patient to follow-up with Dr. Mike Gip at his request in July 2016. Per previous plan of Dr. Remi Deter if there were ever any progression that a repeat PET scan could be performed.  Patient expressed understanding and was in agreement with this plan. He also understands that He can call clinic at any time with any questions, concerns, or complaints.    Evlyn Kanner, NP   08/02/2014 9:40 AM

## 2014-08-25 ENCOUNTER — Encounter
Admission: RE | Disposition: A | Discharge status: Home / Self Care | Payer: Self-pay | Source: Ambulatory Visit | Attending: Gastroenterology

## 2014-08-25 ENCOUNTER — Ambulatory Visit: Payer: Medicare Other | Admitting: Anesthesiology

## 2014-08-25 ENCOUNTER — Ambulatory Visit
Admission: RE | Admit: 2014-08-25 | Discharge: 2014-08-25 | Disposition: A | Discharge status: Home / Self Care | Payer: Medicare Other | Source: Ambulatory Visit | Attending: Gastroenterology | Admitting: Gastroenterology

## 2014-08-25 ENCOUNTER — Encounter: Payer: Self-pay | Admitting: Anesthesiology

## 2014-08-25 DIAGNOSIS — I714 Abdominal aortic aneurysm, without rupture: Secondary | ICD-10-CM | POA: Diagnosis not present

## 2014-08-25 DIAGNOSIS — J449 Chronic obstructive pulmonary disease, unspecified: Secondary | ICD-10-CM | POA: Insufficient documentation

## 2014-08-25 DIAGNOSIS — I10 Essential (primary) hypertension: Secondary | ICD-10-CM | POA: Insufficient documentation

## 2014-08-25 DIAGNOSIS — R197 Diarrhea, unspecified: Secondary | ICD-10-CM | POA: Diagnosis present

## 2014-08-25 DIAGNOSIS — Z8572 Personal history of non-Hodgkin lymphomas: Secondary | ICD-10-CM | POA: Insufficient documentation

## 2014-08-25 DIAGNOSIS — D122 Benign neoplasm of ascending colon: Secondary | ICD-10-CM | POA: Diagnosis not present

## 2014-08-25 DIAGNOSIS — Z79899 Other long term (current) drug therapy: Secondary | ICD-10-CM | POA: Insufficient documentation

## 2014-08-25 DIAGNOSIS — G40909 Epilepsy, unspecified, not intractable, without status epilepticus: Secondary | ICD-10-CM | POA: Insufficient documentation

## 2014-08-25 DIAGNOSIS — K589 Irritable bowel syndrome without diarrhea: Secondary | ICD-10-CM | POA: Diagnosis not present

## 2014-08-25 DIAGNOSIS — Z7951 Long term (current) use of inhaled steroids: Secondary | ICD-10-CM | POA: Insufficient documentation

## 2014-08-25 DIAGNOSIS — K5730 Diverticulosis of large intestine without perforation or abscess without bleeding: Secondary | ICD-10-CM | POA: Diagnosis present

## 2014-08-25 HISTORY — PX: COLONOSCOPY WITH PROPOFOL: SHX5780

## 2014-08-25 SURGERY — COLONOSCOPY WITH PROPOFOL
Anesthesia: General

## 2014-08-25 MED ORDER — PROPOFOL 10 MG/ML IV BOLUS
INTRAVENOUS | Status: DC | PRN
  Administered 2014-08-25: 10 mg via INTRAVENOUS
  Administered 2014-08-25: 20 mg via INTRAVENOUS
  Administered 2014-08-25: 40 mg via INTRAVENOUS

## 2014-08-25 MED ORDER — PROPOFOL INFUSION 10 MG/ML OPTIME
INTRAVENOUS | Status: DC | PRN
  Administered 2014-08-25: 160 ug/kg/min via INTRAVENOUS

## 2014-08-25 MED ORDER — SODIUM CHLORIDE 0.9 % IV SOLN
INTRAVENOUS | Status: DC
  Administered 2014-08-25: 1000 mL via INTRAVENOUS

## 2014-08-25 MED ORDER — LIDOCAINE HCL (CARDIAC) 20 MG/ML IV SOLN
INTRAVENOUS | Status: DC | PRN
  Administered 2014-08-25: 40 mg via INTRAVENOUS

## 2014-08-25 MED ORDER — SODIUM CHLORIDE 0.9 % IV SOLN: INTRAVENOUS | Status: DC

## 2014-08-25 NOTE — Anesthesia Postprocedure Evaluation (Signed)
  Anesthesia Post-op Note  Patient: Robert Weeks  Procedure(s) Performed: Procedure(s): COLONOSCOPY WITH PROPOFOL (N/A)  Anesthesia type:General  Patient location: PACU  Post pain: Pain level controlled  Post assessment: Post-op Vital signs reviewed, Patient's Cardiovascular Status Stable, Respiratory Function Stable, Patent Airway and No signs of Nausea or vomiting  Post vital signs: Reviewed and stable  Last Vitals:  Filed Vitals:   08/25/14 1106  BP: 98/70  Pulse: 60  Temp: 36.4 C  Resp: 18    Level of consciousness: awake, alert  and patient cooperative  Complications: No apparent anesthesia complications

## 2014-08-25 NOTE — Op Note (Signed)
[facility] Gastroenterology Patient Name: Robert Weeks Procedure Date: 08/25/2014 10:37 AM MRN: [id number] Account #: [id number] Date of Birth: Jul 08, 1940 Admit Type: Outpatient Age: 74 Room: [facility] ENDO ROOM 4 Gender: Male Note Status: Finalized Procedure:         Colonoscopy Indications:       Abnormal CT of the GI tract, Chronic diarrhea Providers:         Lupita Dawn. Candace Cruise, MD Referring MD:      Youlanda Roys. Ola Spurr, MD (Referring MD) Medicines:         Monitored Anesthesia Care Complications:     No immediate complications. Procedure:         Pre-Anesthesia Assessment:                    - Prior to the procedure, a History and Physical was                     performed, and patient medications, allergies and                     sensitivities were reviewed. The patient's tolerance of                     previous anesthesia was reviewed.                    - The risks and benefits of the procedure and the sedation                     options and risks were discussed with the patient. All                     questions were answered and informed consent was obtained.                    - After reviewing the risks and benefits, the patient was                     deemed in satisfactory condition to undergo the procedure.                    After obtaining informed consent, the colonoscope was                     passed under direct vision. Throughout the procedure, the                     patient's blood pressure, pulse, and oxygen saturations                     were monitored continuously. The Colonoscope was                     introduced through the anus and advanced to the the cecum,                     identified by appendiceal orifice and ileocecal valve. The                     colonoscopy was performed without difficulty. The patient                     tolerated the procedure well. The quality of the bowel  preparation was good. Findings:       A medium polyp was found in the ascending colon. The polyp was sessile.       The polyp was removed with a hot snare. Resection and retrieval were       complete. Biopsies for histology were taken with a cold forceps from the       entire colon for evaluation of microscopic colitis.      Multiple small-mouthed diverticula were found in the sigmoid colon and       in the ascending colon.      The exam was otherwise without abnormality. Impression:        - One medium polyp in the ascending colon. Resected and                     retrieved. Biopsied.                    - Diverticulosis in the sigmoid colon and in the ascending                     colon.                    - The examination was otherwise normal. Recommendation:    - Discharge patient to home.                    - Await pathology results.                    - Repeat colonoscopy in 3 - 5 years for surveillance based                     on pathology results.                    - The findings and recommendations were discussed with the                     patient. Procedure Code(s): --- Professional ---                    [phone], Colonoscopy, flexible; with removal of tumor(s),                     polyp(s), or other lesion(s) by snare technique Diagnosis Code(s): --- Professional ---                    K52.9, Noninfective gastroenteritis and colitis,                     unspecified                    D12.2, Benign neoplasm of ascending colon                    K57.30, Diverticulosis of large intestine without                     perforation or abscess without bleeding                    R93.3, Abnormal findings on diagnostic imaging of other                     parts of digestive tract CPT copyright 2014 American [employer] Association. All rights reserved.  The codes documented in this report are preliminary and upon coder review may  be revised to meet current compliance requirements. Hulen Luster, MD 08/25/2014 11:04:07  AM This report has been signed electronically. Number of Addenda: 0 Note Initiated On: 08/25/2014 10:37 AM Scope Withdrawal Time: 0 hours 6 minutes 31 seconds  Total Procedure Duration: 0 hours 11 minutes 3 seconds       [facility]

## 2014-08-25 NOTE — Anesthesia Preprocedure Evaluation (Signed)
Anesthesia Evaluation  Patient identified by MRN, date of birth, ID band Patient awake    Reviewed: Allergy & Precautions, NPO status , Patient's Chart, lab work & pertinent test results  Airway Mallampati: II  TM Distance: >3 FB Neck ROM: Full    Dental  (+) Upper Dentures   Pulmonary asthma , COPDformer smoker,  breath sounds clear to auscultation  Pulmonary exam normal       Cardiovascular hypertension, Normal cardiovascular exam RBBB on EKG   Neuro/Psych Seizures -, Well Controlled,  negative psych ROS   GI/Hepatic Neg liver ROS, Adaptive colitis   Endo/Other  negative endocrine ROS  Renal/GU negative Renal ROS  negative genitourinary   Musculoskeletal negative musculoskeletal ROS (+)   Abdominal Normal abdominal exam  (+)   Peds negative pediatric ROS (+)  Hematology   Anesthesia Other Findings Hx of lymphoma  Reproductive/Obstetrics                             Anesthesia Physical Anesthesia Plan  ASA: III  Anesthesia Plan: General   Post-op Pain Management:    Induction: Intravenous  Airway Management Planned: Nasal Cannula  Additional Equipment:   Intra-op Plan:   Post-operative Plan:   Informed Consent: I have reviewed the patients History and Physical, chart, labs and discussed the procedure including the risks, benefits and alternatives for the proposed anesthesia with the patient or authorized representative who has indicated his/her understanding and acceptance.   Dental advisory given  Plan Discussed with: CRNA and Surgeon  Anesthesia Plan Comments:         Anesthesia Quick Evaluation

## 2014-08-25 NOTE — Anesthesia Procedure Notes (Signed)
Performed by: Algernon Mundie Pre-anesthesia Checklist: Patient identified, Emergency Drugs available, Suction available, Patient being monitored and Timeout performed Patient Re-evaluated:Patient Re-evaluated prior to inductionOxygen Delivery Method: Nasal cannula Intubation Type: IV induction       

## 2014-08-25 NOTE — H&P (Signed)
  Date of Initial H&P: 08/24/2014  History reviewed, patient examined, no change in status, stable for surgery.

## 2014-08-25 NOTE — Transfer of Care (Signed)
Immediate Anesthesia Transfer of Care Note  Patient: Robert Weeks  Procedure(s) Performed: Procedure(s): COLONOSCOPY WITH PROPOFOL (N/A)  Patient Location: PACU  Anesthesia Type:General  Level of Consciousness: awake, alert  and oriented  Airway & Oxygen Therapy: Patient Spontanous Breathing and Patient connected to nasal cannula oxygen  Post-op Assessment: Report given to RN and Post -op Vital signs reviewed and stable  Post vital signs: Reviewed and stable  Last Vitals:  Filed Vitals:   08/25/14 1106  BP: 98/70  Pulse: 60  Temp: 36.4 C  Resp: 18    Complications: No apparent anesthesia complications

## 2014-08-26 NOTE — Progress Notes (Signed)
Non-Identifying Voicemail.  No Message left.

## 2014-08-29 ENCOUNTER — Encounter: Payer: Self-pay | Admitting: Gastroenterology

## 2014-08-29 ENCOUNTER — Other Ambulatory Visit: Payer: Self-pay | Admitting: *Deleted

## 2014-08-29 DIAGNOSIS — D72829 Elevated white blood cell count, unspecified: Secondary | ICD-10-CM

## 2014-08-29 DIAGNOSIS — C8220 Follicular lymphoma grade III, unspecified, unspecified site: Secondary | ICD-10-CM

## 2014-08-29 DIAGNOSIS — C8330 Diffuse large B-cell lymphoma, unspecified site: Secondary | ICD-10-CM

## 2014-08-29 LAB — SURGICAL PATHOLOGY

## 2014-08-31 ENCOUNTER — Inpatient Hospital Stay: Payer: Medicare Other | Attending: Hematology and Oncology

## 2014-08-31 ENCOUNTER — Inpatient Hospital Stay (HOSPITAL_BASED_OUTPATIENT_CLINIC_OR_DEPARTMENT_OTHER): Payer: Medicare Other | Admitting: Hematology and Oncology

## 2014-08-31 ENCOUNTER — Encounter: Payer: Self-pay | Admitting: Hematology and Oncology

## 2014-08-31 VITALS — BP 152/88 | HR 64 | Temp 96.3°F | Ht 69.0 in | Wt 150.4 lb

## 2014-08-31 DIAGNOSIS — J449 Chronic obstructive pulmonary disease, unspecified: Secondary | ICD-10-CM | POA: Diagnosis not present

## 2014-08-31 DIAGNOSIS — K5730 Diverticulosis of large intestine without perforation or abscess without bleeding: Secondary | ICD-10-CM

## 2014-08-31 DIAGNOSIS — D72829 Elevated white blood cell count, unspecified: Secondary | ICD-10-CM

## 2014-08-31 DIAGNOSIS — Z9221 Personal history of antineoplastic chemotherapy: Secondary | ICD-10-CM | POA: Diagnosis not present

## 2014-08-31 DIAGNOSIS — N400 Enlarged prostate without lower urinary tract symptoms: Secondary | ICD-10-CM

## 2014-08-31 DIAGNOSIS — M109 Gout, unspecified: Secondary | ICD-10-CM

## 2014-08-31 DIAGNOSIS — Z87891 Personal history of nicotine dependence: Secondary | ICD-10-CM | POA: Insufficient documentation

## 2014-08-31 DIAGNOSIS — I10 Essential (primary) hypertension: Secondary | ICD-10-CM

## 2014-08-31 DIAGNOSIS — C8338 Diffuse large B-cell lymphoma, lymph nodes of multiple sites: Secondary | ICD-10-CM | POA: Diagnosis not present

## 2014-08-31 DIAGNOSIS — J45909 Unspecified asthma, uncomplicated: Secondary | ICD-10-CM | POA: Insufficient documentation

## 2014-08-31 DIAGNOSIS — C8330 Diffuse large B-cell lymphoma, unspecified site: Secondary | ICD-10-CM

## 2014-08-31 DIAGNOSIS — C8220 Follicular lymphoma grade III, unspecified, unspecified site: Secondary | ICD-10-CM

## 2014-08-31 DIAGNOSIS — Z79899 Other long term (current) drug therapy: Secondary | ICD-10-CM | POA: Insufficient documentation

## 2014-08-31 DIAGNOSIS — C8228 Follicular lymphoma grade III, unspecified, lymph nodes of multiple sites: Secondary | ICD-10-CM

## 2014-08-31 LAB — CREATININE, SERUM
Creatinine, Ser: 1.28 mg/dL — ABNORMAL HIGH (ref 0.61–1.24)
GFR calc Af Amer: >60 mL/min (ref >60)
GFR calc non Af Amer: 53 mL/min — ABNORMAL LOW (ref >60)

## 2014-08-31 LAB — CBC WITH DIFFERENTIAL/PLATELET
Basophils Absolute: 0 10*3/uL (ref 0–0.1)
Basophils Relative: 0 %
Eosinophils Absolute: 0.1 10*3/uL (ref 0–0.7)
Eosinophils Relative: 2 %
HCT: 39 % — ABNORMAL LOW (ref 40.0–52.0)
Hemoglobin: 13.1 g/dL (ref 13.0–18.0)
Lymphocytes Relative: 20 %
Lymphs Abs: 1.4 10*3/uL (ref 1.0–3.6)
MCH: 32.5 pg (ref 26.0–34.0)
MCHC: 33.6 g/dL (ref 32.0–36.0)
MCV: 96.9 fL (ref 80.0–100.0)
Monocytes Absolute: 0.7 10*3/uL (ref 0.2–1.0)
Monocytes Relative: 10 %
Neutro Abs: 4.8 10*3/uL (ref 1.4–6.5)
Neutrophils Relative %: 68 %
Platelets: 163 10*3/uL (ref 150–440)
RBC: 4.02 MIL/uL — ABNORMAL LOW (ref 4.40–5.90)
RDW: 13.8 % (ref 11.5–14.5)
WBC: 6.9 10*3/uL (ref 3.8–10.6)

## 2014-08-31 LAB — LACTATE DEHYDROGENASE: LDH: 127 U/L (ref 98–192)

## 2014-08-31 MED ORDER — HEPARIN SOD (PORK) LOCK FLUSH 100 UNIT/ML IV SOLN
INTRAVENOUS | Status: AC
  Filled 2014-08-31: qty 5

## 2014-08-31 NOTE — Progress Notes (Signed)
Pt here today for follow up regarding lymphoma; offers no complaints today

## 2014-10-09 ENCOUNTER — Encounter: Payer: Self-pay | Admitting: *Deleted

## 2014-10-16 ENCOUNTER — Ambulatory Visit: Payer: Self-pay | Admitting: Urology

## 2014-10-19 ENCOUNTER — Inpatient Hospital Stay: Payer: Medicare Other | Attending: Hematology and Oncology

## 2014-10-19 DIAGNOSIS — Z8572 Personal history of non-Hodgkin lymphomas: Secondary | ICD-10-CM | POA: Diagnosis present

## 2014-10-19 DIAGNOSIS — Z9221 Personal history of antineoplastic chemotherapy: Secondary | ICD-10-CM | POA: Diagnosis not present

## 2014-10-19 DIAGNOSIS — Z452 Encounter for adjustment and management of vascular access device: Secondary | ICD-10-CM | POA: Diagnosis not present

## 2014-10-19 DIAGNOSIS — C801 Malignant (primary) neoplasm, unspecified: Secondary | ICD-10-CM

## 2014-10-19 MED ORDER — HEPARIN SOD (PORK) LOCK FLUSH 100 UNIT/ML IV SOLN
500.0000 [IU] | Freq: Once | INTRAVENOUS | Status: AC
Start: 2014-10-19 — End: 2014-10-19
  Administered 2014-10-19: 500 [IU] via INTRAVENOUS
  Filled 2014-10-19: qty 5

## 2014-10-19 MED ORDER — SODIUM CHLORIDE 0.9 % IJ SOLN
10.0000 mL | INTRAMUSCULAR | Status: DC | PRN
  Administered 2014-10-19: 10 mL via INTRAVENOUS
  Filled 2014-10-19: qty 10

## 2014-12-16 DIAGNOSIS — R2681 Unsteadiness on feet: Secondary | ICD-10-CM | POA: Insufficient documentation

## 2014-12-31 ENCOUNTER — Encounter: Payer: Self-pay | Admitting: Hematology and Oncology

## 2014-12-31 NOTE — Progress Notes (Signed)
[facility] day:  08/31/2014  Chief Complaint: Robert Weeks is a 74 y.o. 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  HPI:   The patient presented with a right sided pleural effusion, retroperitoneal, and axillary adenopathy in 03/2009.  Pleural fluid on 03/30/2009 suggested the presence of a lymphoma of follicle center cell origin. Cytology was inconclusive.  Bone marrow biopsy on 04/04/2009 was normocellular marrow with no evidence of lymphoma.  CT guided retroperitoneal mass biopsy on 04/12/2009 was inconslusive.  Left axillary biopsy on 04/17/2009 were consistent with diffuse large B cell lymphoma and follicular lymphoma, grade 3.  MUGA on 04/13/2009 revealed an EF of 56%.  He received 8 cycles of RCHOP from 04/24/2009- 09/26/2009.  He tolerated his chemotherapy well. He states that it didn't affect his stomach although he had and he had to watch what he ate (spicy foods).  PET scan on 12/03/2009 revealed a positive stable left periaortic lymph node. There was a new PET positive right iliac lymph node.  He received maintainance Rituxan every 2 months beginning 12/12/09 until 03/19/2011 (9 of 12 planned treatments).  PET scan on 06/04/2010 revealed no abnormal FDG uptake.  PET scan on 11/17/2011 revealed stable soft tissue fullness in the retroperitoneum, but no findings to suggest active lymphoma.  PET scan on 09/10/2012 revealed stable nodes.  CT scan on 07/10/2014 revealed mesenteric root and retroperitoneal stranding without overt nodularity characteristic of treated lymphoma and stable from prior exams. He has cholelithiasis. There was a 9 mm fluid density in the anterior pancreatic body. He has bilateral fluid densities of the kidneys favoring cysts. There was wall thickening at the anal rectal junction. He has a 3.7 cm bilobed infrarenal abdominal aortic aneurysm for which follow-up  ultrasound was recommended in 3 years. He has lumbar disc disease.  Patient was initially seen by Dr. Inez Pilgrim on 05/14/2011. He was noted to have chronic diarrhea. He has been followed by Dr. Candace Cruise. He underwent colonoscopy on 08/25/2014 to follow-up on an abnormal CT scan. There was a medium polyp in the ascending colon (resected).  There was diverticulosis  in the sigmoid and ascending colon. The pathology in the ascending colon revealed a tubular adenoma. Random colon biopsies were negative.  Symptomatically, he denies any fevers, sweats, or weight loss. He feels good. He denies any pain. He notes an enlarged prostate. His diarrhea has decreased to 1-2 episodes.  Past Medical History  Diagnosis Date  . Asthma   . Cancer     lymphom dx in 2011 in remission  . Hypertension   . COPD (chronic obstructive pulmonary disease)   . Seizures   . Gout   . BPH (benign prostatic hyperplasia)   . Elevated PSA   . Erectile dysfunction     Past Surgical History  Procedure Laterality Date  . Colonoscopy with propofol N/A 08/25/2014    Procedure: COLONOSCOPY WITH PROPOFOL;  Surgeon: Hulen Luster, MD;  Location: [facility] ENDOSCOPY;  Service: Gastroenterology;  Laterality: N/A;  . Lymphectomy    . Cystoscopy      Family History  Problem Relation Age of Onset  . Kidney disease Father 5  . Prostate cancer Neg Hx     Social History:  reports that he quit smoking about 5 years ago. He does not have any smokeless tobacco history on file. He reports that he drinks alcohol. His drug history is not on file.  The patient is alone today.  Allergies:  Allergies  Allergen Reactions  . Finasteride     Current Medications: Current Outpatient Prescriptions  Medication Sig Dispense Refill  . ADVAIR DISKUS 250-50 MCG/DOSE AEPB     . levETIRAcetam (KEPPRA) 750 MG tablet     . lisinopril (PRINIVIL,ZESTRIL) 5 MG tablet     . KLOR-CON M20 20 MEQ tablet      No current facility-administered medications for this  visit.    Review of Systems:  GENERAL:  Feels good.  No fevers, sweats or weight loss. PERFORMANCE STATUS (ECOG):  1 HEENT:  No visual changes, runny nose, sore throat, mouth sores or tenderness. Lungs: No shortness of breath or cough.  No hemoptysis. Cardiac:  No chest pain, palpitations, orthopnea, or PND. GI:  Diarrhea (decreased).  No nausea, vomiting, constipation, melena or hematochezia. GU:  Enlarged prostate.  No urgency, frequency, dysuria, or hematuria. Musculoskeletal:  No back pain.  No joint pain.  No muscle tenderness. Extremities:  No pain or swelling. Skin:  No rashes or skin changes. Neuro:  No headache, numbness or weakness, balance or coordination issues. Endocrine:  No diabetes, thyroid issues, hot flashes or night sweats. Psych:  No mood changes, depression or anxiety. Pain:  No focal pain. Review of systems:  All other systems reviewed and found to be negative.  Physical Exam: Blood pressure 152/88, pulse 64, temperature 96.3 F (35.7 C), temperature source Tympanic, height 5' 9"  (1.753 m), weight 150 lb 5.7 oz (68.2 kg). GENERAL:  Well developed, well nourished, sitting comfortably in the exam room in no acute distress. MENTAL STATUS:  Alert and oriented to person, place and time. HEAD:  Thinning brown hair.  Mustache.  Normocephalic, atraumatic, face symmetric, no Cushingoid features. EYES:  Glasses.  Brown eyes.  Pupils equal round and reactive to light and accomodation.  No conjunctivitis or scleral icterus. ENT:  Oropharynx clear without lesion.  Dentures.  Tongue normal. Mucous membranes moist.  RESPIRATORY:  Clear to auscultation without rales, wheezes or rhonchi. CARDIOVASCULAR:  Regular rate and rhythm without murmur, rub or gallop. ABDOMEN:  Soft, non-tender, with active bowel sounds, and no hepatosplenomegaly.  No masses. SKIN:  Left arm bruise in antecubital fossa.  No rashes, ulcers or lesions. EXTREMITIES: No edema, no skin discoloration or  tenderness.  No palpable cords. LYMPH NODES: No palpable cervical, supraclavicular, axillary or inguinal adenopathy  NEUROLOGICAL: Unremarkable. PSYCH:  Appropriate.  Clinical Support on 08/31/2014  Component Date Value Ref Range Status  . WBC 08/31/2014 6.9  3.8 - 10.6 K/uL Final   A-LINE DRAW  . RBC 08/31/2014 4.02* 4.40 - 5.90 MIL/uL Final  . Hemoglobin 08/31/2014 13.1  13.0 - 18.0 g/dL Final  . HCT 08/31/2014 39.0* 40.0 - 52.0 % Final  . MCV 08/31/2014 96.9  80.0 - 100.0 fL Final  . MCH 08/31/2014 32.5  26.0 - 34.0 pg Final  . MCHC 08/31/2014 33.6  32.0 - 36.0 g/dL Final  . RDW 08/31/2014 13.8  11.5 - 14.5 % Final  . Platelets 08/31/2014 163  150 - 440 K/uL Final  . Neutrophils Relative % 08/31/2014 68   Final  . Neutro Abs 08/31/2014 4.8  1.4 - 6.5 K/uL Final  . Lymphocytes Relative 08/31/2014 20   Final  . Lymphs Abs 08/31/2014 1.4  1.0 - 3.6 K/uL Final  . Monocytes Relative 08/31/2014 10   Final  . Monocytes Absolute 08/31/2014 0.7  0.2 - 1.0 K/uL Final  . Eosinophils Relative 08/31/2014 2  Final  . Eosinophils Absolute 08/31/2014 0.1  0 - 0.7 K/uL Final  . Basophils Relative 08/31/2014 0   Final  . Basophils Absolute 08/31/2014 0.0  0 - 0.1 K/uL Final  . Creatinine, Ser 08/31/2014 1.28* 0.61 - 1.24 mg/dL Final  . GFR calc non Af Amer 08/31/2014 53* >60 mL/min Final  . GFR calc Af Amer 08/31/2014 >60  >60 mL/min Final   Comment: (NOTE) The eGFR has been calculated using the CKD EPI equation. This calculation has not been validated in all clinical situations. eGFR's persistently <60 mL/min signify possible Chronic Kidney Disease.   Marland Kitchen LDH 08/31/2014 127  98 - 192 U/L Final    Assessment:  Robert Weeks is a 74 y.o. 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  He presented with a right sided pleural effusion, retoperitoneal, and axillary adenopathy in 03/2009.  Pleural fluid on 03/30/2009 suggested the presence of a  lymphoma of follicle center cell origin. Cytology was inconclusive.  Bone marrow biopsy on 04/04/2009 was negative.  Left axillary biopsy on 04/17/2009 were consistent with diffuse large B cell lymphoma and follicular lymphoma, grade 3.    He received 8 cycles of RCHOP from 04/24/2009- 09/26/2009.  He tolerated his chemotherapy well. He received maintainance Rituxan every 2 months beginning 12/12/09 until 03/19/2011 (9 of 12 planned treatments).    PET scan on 09/10/2012 revealed stable nodes.  CT scan on 07/10/2014 revealed mesenteric root and retroperitoneal stranding without overt nodularity characteristic of treated lymphoma and stable from prior exams. There was wall thickening at the anal rectal junction. 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  He underwent colonoscopy on 08/25/2014. There was a medium polyp in the ascending colon (tubular adenoma).  There was diverticulosis  in the sigmoid and ascending colon. Random colon biopsies were negative.  Symptomatically, he denies any B symptoms. He feels good. Exam reveals no adenopathy or hepatosplenomegaly.  Plan: 1. Review entire medical history, diagnosis of lymphoma, and treatment to date. 2. Discuss recent CT scan from 07/10/2014. 3. Labs today:  CBC with diff, Cr, LDH. 4. Nurse to call Dr Myrna Blazer office re: colonoscopy. 5. Port flush every 6-8 weeks. 6. Follow-up with Dr Lucky Cowboy (vascular) per prior schedule. 7. RTC in 4 months for MD assess and labs (CBC with diff, CMP, LDH, uric acid).  Lequita Asal, MD  08/31/2014

## 2015-01-01 ENCOUNTER — Inpatient Hospital Stay: Payer: Medicare Other | Attending: Hematology and Oncology

## 2015-01-01 ENCOUNTER — Inpatient Hospital Stay (HOSPITAL_BASED_OUTPATIENT_CLINIC_OR_DEPARTMENT_OTHER): Payer: Medicare Other | Admitting: Hematology and Oncology

## 2015-01-01 VITALS — BP 156/85 | HR 71 | Temp 96.9°F | Resp 18 | Ht 69.0 in | Wt 154.3 lb

## 2015-01-01 DIAGNOSIS — D539 Nutritional anemia, unspecified: Secondary | ICD-10-CM | POA: Insufficient documentation

## 2015-01-01 DIAGNOSIS — I10 Essential (primary) hypertension: Secondary | ICD-10-CM

## 2015-01-01 DIAGNOSIS — D649 Anemia, unspecified: Secondary | ICD-10-CM

## 2015-01-01 DIAGNOSIS — Z9221 Personal history of antineoplastic chemotherapy: Secondary | ICD-10-CM | POA: Insufficient documentation

## 2015-01-01 DIAGNOSIS — M109 Gout, unspecified: Secondary | ICD-10-CM | POA: Insufficient documentation

## 2015-01-01 DIAGNOSIS — Z87891 Personal history of nicotine dependence: Secondary | ICD-10-CM | POA: Diagnosis not present

## 2015-01-01 DIAGNOSIS — C8228 Follicular lymphoma grade III, unspecified, lymph nodes of multiple sites: Secondary | ICD-10-CM

## 2015-01-01 DIAGNOSIS — R197 Diarrhea, unspecified: Secondary | ICD-10-CM

## 2015-01-01 DIAGNOSIS — J449 Chronic obstructive pulmonary disease, unspecified: Secondary | ICD-10-CM | POA: Insufficient documentation

## 2015-01-01 DIAGNOSIS — J45909 Unspecified asthma, uncomplicated: Secondary | ICD-10-CM | POA: Diagnosis not present

## 2015-01-01 DIAGNOSIS — N400 Enlarged prostate without lower urinary tract symptoms: Secondary | ICD-10-CM | POA: Insufficient documentation

## 2015-01-01 DIAGNOSIS — Z8042 Family history of malignant neoplasm of prostate: Secondary | ICD-10-CM | POA: Insufficient documentation

## 2015-01-01 DIAGNOSIS — E876 Hypokalemia: Secondary | ICD-10-CM

## 2015-01-01 DIAGNOSIS — Z79899 Other long term (current) drug therapy: Secondary | ICD-10-CM

## 2015-01-01 DIAGNOSIS — I714 Abdominal aortic aneurysm, without rupture: Secondary | ICD-10-CM | POA: Diagnosis not present

## 2015-01-01 LAB — COMPREHENSIVE METABOLIC PANEL
ALT: 16 U/L — ABNORMAL LOW (ref 17–63)
AST: 29 U/L (ref 15–41)
Albumin: 3.9 g/dL (ref 3.5–5.0)
Alkaline Phosphatase: 55 U/L (ref 38–126)
Anion gap: 8 (ref 5–15)
BUN: 12 mg/dL (ref 6–20)
CO2: 31 mmol/L (ref 22–32)
Calcium: 8.4 mg/dL — ABNORMAL LOW (ref 8.9–10.3)
Chloride: 100 mmol/L — ABNORMAL LOW (ref 101–111)
Creatinine, Ser: 1.29 mg/dL — ABNORMAL HIGH (ref 0.61–1.24)
GFR calc Af Amer: >60 mL/min (ref >60)
GFR calc non Af Amer: 52 mL/min — ABNORMAL LOW (ref >60)
Glucose, Bld: 116 mg/dL — ABNORMAL HIGH (ref 65–99)
Potassium: 2.5 mmol/L — CL (ref 3.5–5.1)
Sodium: 139 mmol/L (ref 135–145)
Total Bilirubin: 1.3 mg/dL — ABNORMAL HIGH (ref 0.3–1.2)
Total Protein: 6.6 g/dL (ref 6.5–8.1)

## 2015-01-01 LAB — CBC WITH DIFFERENTIAL/PLATELET
Basophils Absolute: 0 10*3/uL (ref 0–0.1)
Basophils Relative: 1 %
Eosinophils Absolute: 0.1 10*3/uL (ref 0–0.7)
Eosinophils Relative: 2 %
HCT: 36.2 % — ABNORMAL LOW (ref 40.0–52.0)
Hemoglobin: 12.4 g/dL — ABNORMAL LOW (ref 13.0–18.0)
Lymphocytes Relative: 16 %
Lymphs Abs: 1 10*3/uL (ref 1.0–3.6)
MCH: 34.5 pg — ABNORMAL HIGH (ref 26.0–34.0)
MCHC: 34.2 g/dL (ref 32.0–36.0)
MCV: 100.9 fL — ABNORMAL HIGH (ref 80.0–100.0)
Monocytes Absolute: 0.6 10*3/uL (ref 0.2–1.0)
Monocytes Relative: 9 %
Neutro Abs: 4.6 10*3/uL (ref 1.4–6.5)
Neutrophils Relative %: 72 %
Platelets: 170 10*3/uL (ref 150–440)
RBC: 3.59 MIL/uL — ABNORMAL LOW (ref 4.40–5.90)
RDW: 14.8 % — ABNORMAL HIGH (ref 11.5–14.5)
WBC: 6.3 10*3/uL (ref 3.8–10.6)

## 2015-01-01 LAB — LACTATE DEHYDROGENASE: LDH: 161 U/L (ref 98–192)

## 2015-01-01 LAB — URIC ACID: Uric Acid, Serum: 7.3 mg/dL (ref 4.4–7.6)

## 2015-01-01 NOTE — Progress Notes (Signed)
[facility] day:  01/01/2015   Chief Complaint: Robert Weeks is a 74 y.o. 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  HPI:   The patient was last seen in the medical oncology clinic on 08/31/2014.  At that time, he was seen for initial assessment by me.  He denied any B symptoms. He felt good.  Exam revealed no adenopathy or hepatosplenomegaly.  CT scan from 07/10/2014 was reviewed.  There was mesenteric root and retroperitoneal stranding without overt nodularity characteristic of treated lymphoma and stable from prior exams. He had cholelithiasis. There was a 9 mm fluid density in the anterior pancreatic body. He had bilateral fluid densities of the kidneys favoring cysts. There was wall thickening at the anal rectal junction. He has a 3.7 cm bilobed infrarenal abdominal aortic aneurysm for which follow-up ultrasound was recommended in 3 years. He had lumbar disc disease.  He was to have followed up with Dr. Candace Cruise, gastroenterologist, for colonoscopy and with Dr. Lucky Cowboy, vascular surgeon.  During the interim, he denies any B symptoms.  He stopped his potassium.  He notes diarrhea which is "pretty normal".  Regarding his diet, he states he started eating for the last 2 months. He has gained weight.  He is eating [place] and pork. He denies any melena or hematochezia.   Past Medical History  Diagnosis Date  . Asthma   . Cancer [facility])     lymphom dx in 2011 in remission  . Hypertension   . COPD (chronic obstructive pulmonary disease) ([place])   . Seizures ([place])   . Gout   . BPH (benign prostatic hyperplasia)   . Elevated PSA   . Erectile dysfunction     Past Surgical History  Procedure Laterality Date  . Colonoscopy with propofol N/A 08/25/2014    Procedure: COLONOSCOPY WITH PROPOFOL;  Surgeon: Hulen Luster, MD;  Location: [facility] ENDOSCOPY;  Service: Gastroenterology;  Laterality: N/A;  .  Lymphectomy    . Cystoscopy      Family History  Problem Relation Age of Onset  . Kidney disease Father 7  . Prostate cancer Neg Hx     Social History:  reports that he quit smoking about 5 years ago. He does not have any smokeless tobacco history on file. He reports that he drinks alcohol. His drug history is not on file.  The patient is alone today.  Allergies:  Allergies  Allergen Reactions  . Finasteride     Current Medications: Current Outpatient Prescriptions  Medication Sig Dispense Refill  . ADVAIR DISKUS 250-50 MCG/DOSE AEPB     . KLOR-CON M20 20 MEQ tablet     . levETIRAcetam (KEPPRA) 750 MG tablet     . lisinopril (PRINIVIL,ZESTRIL) 5 MG tablet      No current facility-administered medications for this visit.    Review of Systems:  GENERAL:  Feels good.  No fevers or sweats.  Weight gain of 4 pounds. PERFORMANCE STATUS (ECOG):  1 HEENT:  No visual changes, runny nose, sore throat, mouth sores or tenderness. Lungs: No shortness of breath or cough.  No hemoptysis. Cardiac:  No chest pain, palpitations, orthopnea, or PND. GI:  Eating better.  Diarrhea ("pretty normal").  No nausea, vomiting, constipation, melena or hematochezia. GU:  Enlarged prostate.  No urgency, frequency, dysuria, or hematuria. Musculoskeletal:  No back pain.  No joint pain.  No muscle tenderness. Extremities:  No pain or swelling. Skin:  No rashes or skin changes. Neuro:  No headache, numbness or weakness, balance or coordination issues. Endocrine:  No diabetes, thyroid issues, hot flashes or night sweats. Psych:  No mood changes, depression or anxiety. Pain:  No focal pain. Review of systems:  All other systems reviewed and found to be negative.  Physical Exam: Blood pressure 156/85, pulse 71, temperature 96.9 F (36.1 C), temperature source Tympanic, resp. rate 18, height 5' 9"  (1.753 m), weight 154 lb 5.2 oz (70 kg). GENERAL:  Well developed, well nourished, sitting comfortably in the  exam room in no acute distress. MENTAL STATUS:  Alert and oriented to person, place and time. HEAD:  Short gray hair.  Mustache.  Normocephalic, atraumatic, face symmetric, no Cushingoid features. EYES:  Glasses.  Brown eyes.  Pupils equal round and reactive to light and accomodation.  No conjunctivitis or scleral icterus. ENT:  Oropharynx clear without lesion.  Dentures.  Tongue normal. Mucous membranes moist.  RESPIRATORY:  Clear to auscultation without rales, wheezes or rhonchi. CARDIOVASCULAR:  Regular rate and rhythm without murmur, rub or gallop. ABDOMEN:  Soft, non-tender, with active bowel sounds, and no hepatosplenomegaly.  No masses. SKIN:  No rashes, ulcers or lesions. EXTREMITIES: No edema, no skin discoloration or tenderness.  No palpable cords. LYMPH NODES: No palpable cervical, supraclavicular, axillary or inguinal adenopathy  NEUROLOGICAL: Unremarkable. PSYCH:  Appropriate.  Appointment on 01/01/2015  Component Date Value Ref Range Status  . WBC 01/01/2015 6.3  3.8 - 10.6 K/uL Final  . RBC 01/01/2015 3.59* 4.40 - 5.90 MIL/uL Final  . Hemoglobin 01/01/2015 12.4* 13.0 - 18.0 g/dL Final  . HCT 01/01/2015 36.2* 40.0 - 52.0 % Final  . MCV 01/01/2015 100.9* 80.0 - 100.0 fL Final  . MCH 01/01/2015 34.5* 26.0 - 34.0 pg Final  . MCHC 01/01/2015 34.2  32.0 - 36.0 g/dL Final  . RDW 01/01/2015 14.8* 11.5 - 14.5 % Final  . Platelets 01/01/2015 170  150 - 440 K/uL Final  . Neutrophils Relative % 01/01/2015 72   Final  . Neutro Abs 01/01/2015 4.6  1.4 - 6.5 K/uL Final  . Lymphocytes Relative 01/01/2015 16   Final  . Lymphs Abs 01/01/2015 1.0  1.0 - 3.6 K/uL Final  . Monocytes Relative 01/01/2015 9   Final  . Monocytes Absolute 01/01/2015 0.6  0.2 - 1.0 K/uL Final  . Eosinophils Relative 01/01/2015 2   Final  . Eosinophils Absolute 01/01/2015 0.1  0 - 0.7 K/uL Final  . Basophils Relative 01/01/2015 1   Final  . Basophils Absolute 01/01/2015 0.0  0 - 0.1 K/uL Final  . Sodium  01/01/2015 139  135 - 145 mmol/L Final  . Potassium 01/01/2015 2.5* 3.5 - 5.1 mmol/L Final   Comment: RESULTS VERIFIED BY REPEAT TESTING CRITICAL RESULT CALLED TO, READ BACK BY AND VERIFIED WITH JOSH Stephenson AT 1026 01/01/2015 KMR   . Chloride 01/01/2015 100* 101 - 111 mmol/L Final  . CO2 01/01/2015 31  22 - 32 mmol/L Final  . Glucose, Bld 01/01/2015 116* 65 - 99 mg/dL Final  . BUN 01/01/2015 12  6 - 20 mg/dL Final  . Creatinine, Ser 01/01/2015 1.29* 0.61 - 1.24 mg/dL Final  . Calcium 01/01/2015 8.4* 8.9 - 10.3 mg/dL Final  . Total Protein 01/01/2015 6.6  6.5 - 8.1 g/dL Final  . Albumin 01/01/2015 3.9  3.5 - 5.0 g/dL Final  . AST 01/01/2015 29  15 - 41 U/L Final  . ALT 01/01/2015 16*  17 - 63 U/L Final  . Alkaline Phosphatase 01/01/2015 55  38 - 126 U/L Final  . Total Bilirubin 01/01/2015 1.3* 0.3 - 1.2 mg/dL Final  . GFR calc non Af Amer 01/01/2015 52* >60 mL/min Final  . GFR calc Af Amer 01/01/2015 >60  >60 mL/min Final   Comment: (NOTE) The eGFR has been calculated using the CKD EPI equation. This calculation has not been validated in all clinical situations. eGFR's persistently <60 mL/min signify possible Chronic Kidney Disease.   . Anion gap 01/01/2015 8  5 - 15 Final  . LDH 01/01/2015 161  98 - 192 U/L Final  . Uric Acid, Serum 01/01/2015 7.3  4.4 - 7.6 mg/dL Final    Assessment:  Robert Weeks is a 74 y.o. 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  He presented with a right sided pleural effusion, retoperitoneal, and axillary adenopathy in 03/2009.  Pleural fluid on 03/30/2009 suggested the presence of a lymphoma of follicle center cell origin. Cytology was inconclusive.  Bone marrow biopsy on 04/04/2009 was negative.  Left axillary biopsy on 04/17/2009 were consistent with diffuse large B cell lymphoma and follicular lymphoma, grade 3.    He received 8 cycles of RCHOP from 04/24/2009- 09/26/2009.  He tolerated his  chemotherapy well. He received maintainance Rituxan every 2 months beginning 12/12/09 until 03/19/2011 (9 of 12 planned treatments).    PET scan on 09/10/2012 revealed stable nodes.  CT scan on 07/10/2014 revealed mesenteric root and retroperitoneal stranding without overt nodularity characteristic of treated lymphoma and stable from prior exams. There was wall thickening at the anal rectal junction. 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  He underwent colonoscopy on 08/25/2014. There was a medium polyp in the ascending colon (tubular adenoma).  There was diverticulosis  in the sigmoid and ascending colon. Random colon biopsies were negative.  He has chronic intermittent diarrhea.  He has a slightly macrocytic anemia.  He denies any melena or hematochezia.  Symptomatically, he denies any B symptoms. Exam reveals no adenopathy or hepatosplenomegaly.  He has hypokalemia.  He stopped his potassium supplimentation.  Plan: 1. Labs today:  CBC with diff, CMP, LDH, uric acid. 2. Discuss low potassium and need for supplimentation (likely secondary to chronic diarrhea).  FAX labs to PCP. 3. Port flush today and every 6-8 weeks. 4. Patient to follow-up with gastroenterology re:  colonoscopy. 5. RTC on 01/04/2015 for anemia work-up:  B12, folate, TSH, ferritin, iron studies, TSH, [place], retic.  Check potassium. 6. RTC in 4 months for MD assessment and labs (CBC with diff, CMP, LDH, uric acid).   Lequita Asal, MD  01/01/2015, 12:09 PM

## 2015-01-04 ENCOUNTER — Inpatient Hospital Stay: Payer: Medicare Other

## 2015-01-04 ENCOUNTER — Telehealth: Payer: Self-pay

## 2015-01-04 DIAGNOSIS — C8228 Follicular lymphoma grade III, unspecified, lymph nodes of multiple sites: Secondary | ICD-10-CM | POA: Diagnosis not present

## 2015-01-04 DIAGNOSIS — E876 Hypokalemia: Secondary | ICD-10-CM

## 2015-01-04 DIAGNOSIS — D649 Anemia, unspecified: Secondary | ICD-10-CM

## 2015-01-04 LAB — DAT, POLYSPECIFIC AHG (ARMC ONLY): Polyspecific AHG test: NEGATIVE

## 2015-01-04 LAB — RETICULOCYTES
RBC.: 3.72 MIL/uL — ABNORMAL LOW (ref 4.40–5.90)
Retic Count, Absolute: 55.8 10*3/uL (ref 19.0–183.0)
Retic Ct Pct: 1.5 % (ref 0.4–3.1)

## 2015-01-04 LAB — TSH: TSH: 0.9 u[IU]/mL (ref 0.350–4.500)

## 2015-01-04 LAB — IRON AND TIBC
Iron: 107 ug/dL (ref 45–182)
Saturation Ratios: 37 % (ref 17.9–39.5)
TIBC: 291 ug/dL (ref 250–450)
UIBC: 184 ug/dL

## 2015-01-04 LAB — FERRITIN: Ferritin: 203 ng/mL (ref 24–336)

## 2015-01-04 LAB — FOLATE: Folate: 6.6 ng/mL (ref >5.9)

## 2015-01-04 LAB — POTASSIUM: Potassium: 3 mmol/L — ABNORMAL LOW (ref 3.5–5.1)

## 2015-01-04 LAB — VITAMIN B12: Vitamin B-12: 164 pg/mL — ABNORMAL LOW (ref 180–914)

## 2015-01-04 MED ORDER — HEPARIN SOD (PORK) LOCK FLUSH 100 UNIT/ML IV SOLN
500.0000 [IU] | Freq: Once | INTRAVENOUS | Status: AC
  Administered 2015-01-04: 500 [IU] via INTRAVENOUS

## 2015-01-04 MED ORDER — SODIUM CHLORIDE 0.9 % IJ SOLN
10.0000 mL | Freq: Once | INTRAMUSCULAR | Status: AC
  Administered 2015-01-04: 10 mL via INTRAVENOUS
  Filled 2015-01-04: qty 10

## 2015-01-04 MED ORDER — HEPARIN SOD (PORK) LOCK FLUSH 100 UNIT/ML IV SOLN
INTRAVENOUS | Status: AC
  Filled 2015-01-04: qty 5

## 2015-01-04 NOTE — Telephone Encounter (Signed)
Per MD called pt to know what pt was doing to increase potassium.  Pt reports Taking potassium 45meq twice a day Per PCP until the 01/11/2015 when PCP will re access.

## 2015-01-05 ENCOUNTER — Telehealth: Payer: Self-pay

## 2015-01-05 ENCOUNTER — Other Ambulatory Visit: Payer: Self-pay | Admitting: Hematology and Oncology

## 2015-01-05 DIAGNOSIS — E538 Deficiency of other specified B group vitamins: Secondary | ICD-10-CM

## 2015-01-05 NOTE — Telephone Encounter (Signed)
Called pt per MD in inform pt that he is B-12 deficient and scheduling would be calling to inform pt of times and appt to some in.  Pt verbalized an understanding

## 2015-01-10 ENCOUNTER — Encounter: Payer: Self-pay | Admitting: *Deleted

## 2015-01-15 ENCOUNTER — Telehealth: Payer: Self-pay | Admitting: *Deleted

## 2015-01-15 NOTE — Telephone Encounter (Signed)
Asking if we have received his lab results and when he is to return to office

## 2015-01-16 ENCOUNTER — Emergency Department: Payer: Medicare Other

## 2015-01-16 ENCOUNTER — Encounter: Payer: Self-pay | Admitting: Intensive Care

## 2015-01-16 ENCOUNTER — Emergency Department
Admission: EM | Admit: 2015-01-16 | Discharge: 2015-01-16 | Disposition: A | Discharge status: Home / Self Care | Payer: Medicare Other | Attending: Emergency Medicine | Admitting: Emergency Medicine

## 2015-01-16 ENCOUNTER — Ambulatory Visit: Payer: Self-pay | Admitting: Urology

## 2015-01-16 DIAGNOSIS — Y92410 Unspecified street and highway as the place of occurrence of the external cause: Secondary | ICD-10-CM | POA: Insufficient documentation

## 2015-01-16 DIAGNOSIS — I10 Essential (primary) hypertension: Secondary | ICD-10-CM | POA: Insufficient documentation

## 2015-01-16 DIAGNOSIS — Y998 Other external cause status: Secondary | ICD-10-CM | POA: Diagnosis not present

## 2015-01-16 DIAGNOSIS — R569 Unspecified convulsions: Secondary | ICD-10-CM | POA: Insufficient documentation

## 2015-01-16 DIAGNOSIS — Y9389 Activity, other specified: Secondary | ICD-10-CM | POA: Insufficient documentation

## 2015-01-16 DIAGNOSIS — Z87891 Personal history of nicotine dependence: Secondary | ICD-10-CM | POA: Diagnosis not present

## 2015-01-16 LAB — COMPREHENSIVE METABOLIC PANEL
ALBUMIN: 4.6 g/dL (ref 3.5–5.0)
ALK PHOS: 66 U/L (ref 38–126)
ALT: 31 U/L (ref 17–63)
ANION GAP: 9 (ref 5–15)
AST: 38 U/L (ref 15–41)
BUN: 15 mg/dL (ref 6–20)
CO2: 22 mmol/L (ref 22–32)
Calcium: 9.8 mg/dL (ref 8.9–10.3)
Chloride: 108 mmol/L (ref 101–111)
Creatinine, Ser: 1.31 mg/dL — ABNORMAL HIGH (ref 0.61–1.24)
GFR calc Af Amer: 59 mL/min — ABNORMAL LOW (ref >60)
GFR calc non Af Amer: 51 mL/min — ABNORMAL LOW (ref >60)
GLUCOSE: 91 mg/dL (ref 65–99)
POTASSIUM: 4.1 mmol/L (ref 3.5–5.1)
SODIUM: 139 mmol/L (ref 135–145)
Total Bilirubin: 1.4 mg/dL — ABNORMAL HIGH (ref 0.3–1.2)
Total Protein: 7.7 g/dL (ref 6.5–8.1)

## 2015-01-16 LAB — CBC
HCT: 42.7 % (ref 40.0–52.0)
HEMOGLOBIN: 14 g/dL (ref 13.0–18.0)
MCH: 33.6 pg (ref 26.0–34.0)
MCHC: 32.7 g/dL (ref 32.0–36.0)
MCV: 102.6 fL — ABNORMAL HIGH (ref 80.0–100.0)
Platelets: 174 10*3/uL (ref 150–440)
RBC: 4.16 MIL/uL — ABNORMAL LOW (ref 4.40–5.90)
RDW: 14.1 % (ref 11.5–14.5)
WBC: 8.4 10*3/uL (ref 3.8–10.6)
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
## 2015-01-16 NOTE — ED Notes (Signed)
MD at bedside. 
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
## 2015-01-16 NOTE — ED Notes (Signed)
Patient back from CT.
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
## 2015-01-17 NOTE — Telephone Encounter (Signed)
Message sent to Dr Mike Gip and Theadora Rama, and was printed off and given to Dr Mike Gip

## 2015-01-23 ENCOUNTER — Encounter: Payer: Self-pay | Admitting: Urology

## 2015-01-23 ENCOUNTER — Ambulatory Visit (INDEPENDENT_AMBULATORY_CARE_PROVIDER_SITE_OTHER): Payer: Medicare Other | Admitting: Urology

## 2015-01-23 VITALS — BP 150/95 | HR 84 | Resp 16 | Ht 70.0 in | Wt 148.3 lb

## 2015-01-23 DIAGNOSIS — N401 Enlarged prostate with lower urinary tract symptoms: Secondary | ICD-10-CM

## 2015-01-23 DIAGNOSIS — N528 Other male erectile dysfunction: Secondary | ICD-10-CM | POA: Diagnosis not present

## 2015-01-23 DIAGNOSIS — N529 Male erectile dysfunction, unspecified: Secondary | ICD-10-CM | POA: Insufficient documentation

## 2015-01-23 DIAGNOSIS — N138 Other obstructive and reflux uropathy: Secondary | ICD-10-CM | POA: Insufficient documentation

## 2015-01-23 NOTE — Progress Notes (Signed)
01/23/2015 4:53 PM   Robert Weeks 10/17/1940 [id number]  Referring provider: Adrian Prows, MD [address] [place], [place] [address]  Chief Complaint  Patient presents with  . Benign Prostatic Hypertrophy    6 month recheck    HPI: Patient is a 74 year old [place] American male with erectile dysfunction and BPH with LUTS who presents today for his 6 month follow up.  Erectile dysfunction His SHIM score is 15, which is mild to moderate ED.   He has been having difficulty with erections for over three years.   His major complaint is maintaining and firmness of the erections.  His libido is preserved.   His risk factors for ED are HTN, BPH and age.  He denies any painful erections or curvatures with his erections.   He has tried PDE5-inhibitors in the past and they were effective.  He is not interested in treating his ED at this time due to his new onset of seizures.          SHIM      01/23/15 1503       SHIM: Over the last 6 months:   How do you rate your confidence that you could get and keep an erection? Low     When you had erections with sexual stimulation, how often were your erections hard enough for penetration (entering your partner)? Sometimes (about half the time)     During sexual intercourse, how often were you able to maintain your erection after you had penetrated (entered) your partner? Very Difficult     During sexual intercourse, how difficult was it to maintain your erection to completion of intercourse? Difficult     When you attempted sexual intercourse, how often was it satisfactory for you? Not Difficult     SHIM Total Score   SHIM 15        Score: 1-7 Severe ED 8-11 Moderate ED 12-16 Mild-Moderate ED 17-21 Mild ED 22-25 No ED  BPH WITH LUTS His IPSS score today is 12, which is moderate lower urinary tract symptomatology. He is mostly satisfied with his quality life due to his urinary  symptoms.  He denies any dysuria, hematuria or suprapubic pain.   He was on finasteride in the past, but he discontinued the medication due to dizzy spells.  He also denies any recent fevers, chills, nausea or vomiting.  He does not have a family history of PCa.      IPSS      01/23/15 1500       International Prostate Symptom Score   How often have you had the sensation of not emptying your bladder? Almost always     How often have you had to urinate less than every two hours? Almost always     How often have you found you stopped and started again several times when you urinated? Not at All     How often have you found it difficult to postpone urination? Less than 1 in 5 times     How often have you had a weak urinary stream? Less than 1 in 5 times     How often have you had to strain to start urination? Not at All     How many times did you typically get up at night to urinate? None     Total IPSS Score 12     Quality of Life due to urinary symptoms   If you  were to spend the rest of your life with your urinary condition just the way it is now how would you feel about that? Mostly Satisfied        Score:  1-7 Mild 8-19 Moderate 20-35 Severe         PMH: Past Medical History  Diagnosis Date  . Asthma   . Cancer [facility])     lymphom dx in 2011 in remission  . Hypertension   . COPD (chronic obstructive pulmonary disease) ([place])   . Seizures ([place])   . Gout   . BPH (benign prostatic hyperplasia)   . Elevated PSA   . Erectile dysfunction   . Lymphoma [facility])     Surgical History: Past Surgical History  Procedure Laterality Date  . Colonoscopy with propofol N/A 08/25/2014    Procedure: COLONOSCOPY WITH PROPOFOL;  Surgeon: Hulen Luster, MD;  Location: [facility] ENDOSCOPY;  Service: Gastroenterology;  Laterality: N/A;  . Lymphectomy    . Cystoscopy      Home Medications:    Medication List       This list is accurate as of: 01/23/15  4:53 PM.  Always use your most recent med  list.               ADVAIR DISKUS 250-50 MCG/DOSE Aepb  Generic drug:  Fluticasone-Salmeterol     levETIRAcetam 750 MG tablet  Commonly known as:  KEPPRA     lisinopril 5 MG tablet  Commonly known as:  PRINIVIL,ZESTRIL     potassium chloride SA 20 MEQ tablet  Commonly known as:  K-DUR,KLOR-CON  Take 20 mEq by mouth daily.        Allergies:  Allergies  Allergen Reactions  . Finasteride     Family History: Family History  Problem Relation Age of Onset  . Kidney disease Father 10  . Prostate cancer Neg Hx     Social History:  reports that he quit smoking about 5 years ago. He does not have any smokeless tobacco history on file. He reports that he drinks alcohol. His drug history is not on file.  ROS: UROLOGY Frequent Urination?: No Hard to postpone urination?: No Burning/pain with urination?: No Get up at night to urinate?: No Leakage of urine?: No Urine stream starts and stops?: No Trouble starting stream?: No Do you have to strain to urinate?: No Blood in urine?: No Urinary tract infection?: No Sexually transmitted disease?: No Injury to kidneys or bladder?: No Painful intercourse?: No Weak stream?: No Erection problems?: No Penile pain?: No  Gastrointestinal Nausea?: No Vomiting?: No Indigestion/heartburn?: No Diarrhea?: No Constipation?: No  Constitutional Fever: No Night sweats?: No Weight loss?: No Fatigue?: No  Skin Skin rash/lesions?: No Itching?: No  Eyes Blurred vision?: No Double vision?: No  Ears/Nose/Throat Sore throat?: No Sinus problems?: No  Hematologic/Lymphatic Swollen glands?: No Easy bruising?: No  Cardiovascular Leg swelling?: No Chest pain?: No  Respiratory Cough?: No Shortness of breath?: No  Endocrine Excessive thirst?: No  Musculoskeletal Back pain?: No Joint pain?: No  Neurological Headaches?: No Dizziness?: No  Psychologic Depression?: No Anxiety?: No  Physical Exam: BP 150/95 mmHg   Pulse 84  Resp 16  Ht 5\' 10"  (1.778 m)  Wt 148 lb 4.8 oz (67.268 kg)  BMI 21.28 kg/m2  GU: No CVA tenderness.  No bladder fullness or masses.  Patient with circumcised phallus.   Urethral meatus is patent.  No penile discharge. No penile lesions or rashes. Scrotum without lesions, cysts, rashes and/or edema.  Testicles are located scrotally bilaterally. No masses are appreciated in the testicles. Left and right epididymis are normal. Rectal: Patient with  normal sphincter tone. Anus and perineum without scarring or rashes. No rectal masses are appreciated. Prostate is approximately 60 grams, no nodules are appreciated. Seminal vesicles are normal.    Laboratory Data: Lab Results  Component Value Date   WBC 8.4 01/16/2015   HGB 14.0 01/16/2015   HCT 42.7 01/16/2015   MCV 102.6* 01/16/2015   PLT 174 01/16/2015    Lab Results  Component Value Date   CREATININE 1.31* 01/16/2015   PSA History  4.0 ng/mL on 04/06/2013  3.8 ng/mL on 10/12/2013  1.0 ng/mL on 04/14/2014 Lab Results  Component Value Date   PSA 3.1 06/25/2012   PSA 5.0* 05/26/2011      Lab Results  Component Value Date   HGBA1C 5.4 11/12/2011       Assessment & Plan:    1. BPH with LUTS:   IPSS score is 12/2.  We will continue to monitor.  He will return to clinic in one year for IPSS score and exam.    2. Erectile dysfunction:   SHIM score is 15.  He is not interested in pursuing any treatment for the ED at this time.  He will return to clinic in one year for SHIM score and exam.      Return in about 1 year (around 01/23/2016) for IPSS score and SHIM.  Zara Council, [facility] [address] [place], [place] [address] [phone]

## 2015-02-27 ENCOUNTER — Telehealth: Payer: Self-pay | Admitting: Urology

## 2015-02-27 DIAGNOSIS — N529 Male erectile dysfunction, unspecified: Secondary | ICD-10-CM

## 2015-02-27 NOTE — Telephone Encounter (Signed)
Pt called asking for refills on his Viagra.  Please call.

## 2015-02-27 NOTE — Telephone Encounter (Signed)
Please advise 

## 2015-02-27 NOTE — Telephone Encounter (Signed)
He may have refills of his Viagra.

## 2015-03-01 ENCOUNTER — Inpatient Hospital Stay: Payer: Medicare Other | Attending: Hematology and Oncology

## 2015-03-01 DIAGNOSIS — C8338 Diffuse large B-cell lymphoma, lymph nodes of multiple sites: Secondary | ICD-10-CM | POA: Diagnosis present

## 2015-03-01 DIAGNOSIS — C801 Malignant (primary) neoplasm, unspecified: Secondary | ICD-10-CM

## 2015-03-01 DIAGNOSIS — Z452 Encounter for adjustment and management of vascular access device: Secondary | ICD-10-CM | POA: Diagnosis not present

## 2015-03-01 DIAGNOSIS — Z9221 Personal history of antineoplastic chemotherapy: Secondary | ICD-10-CM | POA: Insufficient documentation

## 2015-03-01 MED ORDER — HEPARIN SOD (PORK) LOCK FLUSH 100 UNIT/ML IV SOLN
500.0000 [IU] | Freq: Once | INTRAVENOUS | Status: AC
  Administered 2015-03-01: 500 [IU] via INTRAVENOUS
  Filled 2015-03-01: qty 5

## 2015-03-01 MED ORDER — SILDENAFIL CITRATE 100 MG PO TABS: 100.0000 mg | ORAL_TABLET | Freq: Every day | ORAL | Status: DC | PRN

## 2015-03-01 MED ORDER — SODIUM CHLORIDE 0.9 % IJ SOLN
10.0000 mL | INTRAMUSCULAR | Status: DC | PRN
  Administered 2015-03-01: 10 mL
  Filled 2015-03-01: qty 10

## 2015-03-01 NOTE — Telephone Encounter (Signed)
Refill sent to pt pharmacy 

## 2015-04-07 ENCOUNTER — Other Ambulatory Visit: Payer: Self-pay | Admitting: Hematology and Oncology

## 2015-04-07 ENCOUNTER — Encounter: Payer: Self-pay | Admitting: Hematology and Oncology

## 2015-04-10 ENCOUNTER — Other Ambulatory Visit: Payer: Self-pay

## 2015-04-10 DIAGNOSIS — C8228 Follicular lymphoma grade III, unspecified, lymph nodes of multiple sites: Secondary | ICD-10-CM

## 2015-04-13 ENCOUNTER — Inpatient Hospital Stay: Payer: Medicare Other

## 2015-04-13 ENCOUNTER — Inpatient Hospital Stay: Payer: Medicare Other | Attending: Hematology and Oncology

## 2015-04-13 ENCOUNTER — Inpatient Hospital Stay (HOSPITAL_BASED_OUTPATIENT_CLINIC_OR_DEPARTMENT_OTHER): Payer: Medicare Other | Admitting: Hematology and Oncology

## 2015-04-13 ENCOUNTER — Other Ambulatory Visit: Payer: Self-pay

## 2015-04-13 VITALS — BP 136/91 | HR 74 | Temp 96.8°F | Resp 16 | Wt 148.4 lb

## 2015-04-13 DIAGNOSIS — E876 Hypokalemia: Secondary | ICD-10-CM

## 2015-04-13 DIAGNOSIS — J45909 Unspecified asthma, uncomplicated: Secondary | ICD-10-CM | POA: Diagnosis not present

## 2015-04-13 DIAGNOSIS — Z87891 Personal history of nicotine dependence: Secondary | ICD-10-CM | POA: Insufficient documentation

## 2015-04-13 DIAGNOSIS — J449 Chronic obstructive pulmonary disease, unspecified: Secondary | ICD-10-CM

## 2015-04-13 DIAGNOSIS — Z9221 Personal history of antineoplastic chemotherapy: Secondary | ICD-10-CM | POA: Insufficient documentation

## 2015-04-13 DIAGNOSIS — M109 Gout, unspecified: Secondary | ICD-10-CM | POA: Diagnosis not present

## 2015-04-13 DIAGNOSIS — N400 Enlarged prostate without lower urinary tract symptoms: Secondary | ICD-10-CM

## 2015-04-13 DIAGNOSIS — C8228 Follicular lymphoma grade III, unspecified, lymph nodes of multiple sites: Secondary | ICD-10-CM | POA: Diagnosis not present

## 2015-04-13 DIAGNOSIS — E538 Deficiency of other specified B group vitamins: Secondary | ICD-10-CM

## 2015-04-13 DIAGNOSIS — I10 Essential (primary) hypertension: Secondary | ICD-10-CM | POA: Insufficient documentation

## 2015-04-13 DIAGNOSIS — Z79899 Other long term (current) drug therapy: Secondary | ICD-10-CM

## 2015-04-13 DIAGNOSIS — I714 Abdominal aortic aneurysm, without rupture: Secondary | ICD-10-CM

## 2015-04-13 DIAGNOSIS — C8250 Diffuse follicle center lymphoma, unspecified site: Secondary | ICD-10-CM

## 2015-04-13 LAB — COMPREHENSIVE METABOLIC PANEL
ALT: 37 U/L (ref 17–63)
AST: 29 U/L (ref 15–41)
Albumin: 4.3 g/dL (ref 3.5–5.0)
Alkaline Phosphatase: 56 U/L (ref 38–126)
Anion gap: 5 (ref 5–15)
BUN: 13 mg/dL (ref 6–20)
CO2: 25 mmol/L (ref 22–32)
Calcium: 9.2 mg/dL (ref 8.9–10.3)
Chloride: 107 mmol/L (ref 101–111)
Creatinine, Ser: 1.28 mg/dL — ABNORMAL HIGH (ref 0.61–1.24)
GFR calc Af Amer: >60 mL/min (ref >60)
GFR calc non Af Amer: 53 mL/min — ABNORMAL LOW (ref >60)
Glucose, Bld: 106 mg/dL — ABNORMAL HIGH (ref 65–99)
Potassium: 3.5 mmol/L (ref 3.5–5.1)
Sodium: 137 mmol/L (ref 135–145)
Total Bilirubin: 1.3 mg/dL — ABNORMAL HIGH (ref 0.3–1.2)
Total Protein: 7.1 g/dL (ref 6.5–8.1)

## 2015-04-13 LAB — URIC ACID: Uric Acid, Serum: 5.8 mg/dL (ref 4.4–7.6)

## 2015-04-13 LAB — CBC WITH DIFFERENTIAL/PLATELET
Basophils Absolute: 0 10*3/uL (ref 0–0.1)
Basophils Relative: 1 %
Eosinophils Absolute: 0.2 10*3/uL (ref 0–0.7)
Eosinophils Relative: 3 %
HCT: 40.3 % (ref 40.0–52.0)
Hemoglobin: 13.6 g/dL (ref 13.0–18.0)
Lymphocytes Relative: 16 %
Lymphs Abs: 1.1 10*3/uL (ref 1.0–3.6)
MCH: 32.2 pg (ref 26.0–34.0)
MCHC: 33.8 g/dL (ref 32.0–36.0)
MCV: 95.4 fL (ref 80.0–100.0)
Monocytes Absolute: 0.6 10*3/uL (ref 0.2–1.0)
Monocytes Relative: 9 %
Neutro Abs: 5.2 10*3/uL (ref 1.4–6.5)
Neutrophils Relative %: 73 %
Platelets: 172 10*3/uL (ref 150–440)
RBC: 4.22 MIL/uL — ABNORMAL LOW (ref 4.40–5.90)
RDW: 13.6 % (ref 11.5–14.5)
WBC: 7.2 10*3/uL (ref 3.8–10.6)

## 2015-04-13 LAB — VITAMIN B12: Vitamin B-12: 419 pg/mL (ref 180–914)

## 2015-04-13 LAB — LACTATE DEHYDROGENASE: LDH: 121 U/L (ref 98–192)

## 2015-04-13 LAB — FOLATE: Folate: 9.8 ng/mL (ref >5.9)

## 2015-04-13 NOTE — Progress Notes (Signed)
[facility] day:  04/13/2015   Chief Complaint: Robert Weeks is a 75 y.o. 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  HPI:   The patient was last seen in the medical oncology clinic on 01/01/2015.  At that time, he denied any B symptoms. Exam revealed no adenopathy or hepatosplenomegaly.  He had hypokalemia as he had stopped his potassium supplimentation.  CBC with diff on 01/01/2015 revealed a hematocrit of 36.2, hemoglobin 12.4, MCV 100.9, platelets 170,000, WBC 6300 with an ANC of 4600.  Labs were drawn on 01/04/2015 to evaluate his anemia.  Retic was 1.5% (inappropriately low).  Coombs was negative.  B12 was 164 (low).  Folate was 6.6 (> 5.9).  Ferritin was 203.  Iron studies included a saturation of 37% and a TIBC of 291.    TSH was 0.90.  He was called about starting B12 injections. He has not started them.  He states that he "lost track of them".  Symptomatically, did not see his PCP regarding his diarrhea.  Diarrhea is better now.  His appetite is good.  He denies any fevers or sweats.  He feels that he should be gaining more weight.  Past Medical History  Diagnosis Date  . Asthma   . Cancer [facility])     lymphom dx in 2011 in remission  . Hypertension   . COPD (chronic obstructive pulmonary disease) ([place])   . Seizures ([place])   . Gout   . BPH (benign prostatic hyperplasia)   . Elevated PSA   . Erectile dysfunction   . Lymphoma [facility])     Past Surgical History  Procedure Laterality Date  . Colonoscopy with propofol N/A 08/25/2014    Procedure: COLONOSCOPY WITH PROPOFOL;  Surgeon: Hulen Luster, MD;  Location[facility] ENDOSCOPY;  Service: Gastroenterology;  Laterality: N/A;  . Lymphectomy    . Cystoscopy      Family History  Problem Relation Age of Onset  . Kidney disease Father 55  . Prostate cancer Neg Hx     Social History:  reports that he quit smoking about 5 years  ago. He does not have any smokeless tobacco history on file. He reports that he drinks alcohol. His drug history is not on file.  The patient is alone today.  Allergies:  Allergies  Allergen Reactions  . Finasteride     Current Medications: Current Outpatient Prescriptions  Medication Sig Dispense Refill  . ADVAIR DISKUS 250-50 MCG/DOSE AEPB     . lamoTRIgine (LAMICTAL) 25 MG tablet     . levETIRAcetam (KEPPRA) 750 MG tablet     . lisinopril (PRINIVIL,ZESTRIL) 5 MG tablet     . potassium chloride SA (K-DUR,KLOR-CON) 20 MEQ tablet Take 20 mEq by mouth daily.    . sildenafil (VIAGRA) 100 MG tablet Take 1 tablet (100 mg total) by mouth daily as needed for erectile dysfunction. 6 tablet 0   No current facility-administered medications for this visit.    Review of Systems:  GENERAL:  Feels good.  No fevers or sweats.  Weight stable. PERFORMANCE STATUS (ECOG):  1 HEENT:  No visual changes, runny nose, sore throat, mouth sores or tenderness. Lungs: No shortness of breath or cough.  No hemoptysis. Cardiac:  No chest pain, palpitations, orthopnea, or PND. GI:  Eating well.  Diarrhea, under better ontrol.  No nausea, vomiting, constipation, melena or hematochezia. GU:  Enlarged prostate.  No urgency, frequency, dysuria, or hematuria. Musculoskeletal:  No back pain.  No joint pain.  No muscle tenderness. Extremities:  No pain or swelling. Skin:  No rashes or skin changes. Neuro:  No headache, numbness or weakness, balance or coordination issues. Endocrine:  No diabetes, thyroid issues, hot flashes or night sweats. Psych:  No mood changes, depression or anxiety. Pain:  No focal pain. Review of systems:  All other systems reviewed and found to be negative.  Physical Exam: Blood pressure 136/91, pulse 74, temperature 96.8 F (36 C), temperature source Tympanic, resp. rate 16, weight 148 lb 5.9 oz (67.3 kg). GENERAL:  Thin gentleman sitting comfortably in the exam room in no acute  distress. MENTAL STATUS:  Alert and oriented to person, place and time. HEAD:  Wearing a cap.  Short gray hair.  Goatee.  Normocephalic, atraumatic, face symmetric, no Cushingoid features. EYES:  Glasses.  Brown eyes.  Pupils equal round and reactive to light and accomodation.  No conjunctivitis or scleral icterus. ENT:  Oropharynx clear without lesion.  Dentures.  Tongue normal. Mucous membranes moist.  RESPIRATORY:  Clear to auscultation without rales, wheezes or rhonchi. CARDIOVASCULAR:  Regular rate and rhythm without murmur, rub or gallop. ABDOMEN:  Soft, non-tender, with active bowel sounds, and no hepatosplenomegaly.  No masses. SKIN:  No rashes, ulcers or lesions. EXTREMITIES: No edema, no skin discoloration or tenderness.  No palpable cords. LYMPH NODES: No palpable cervical, supraclavicular, axillary or inguinal adenopathy  NEUROLOGICAL: Unremarkable. PSYCH:  Appropriate.  Appointment on 04/13/2015  Component Date Value Ref Range Status  . WBC 04/13/2015 7.2  3.8 - 10.6 K/uL Final  . RBC 04/13/2015 4.22* 4.40 - 5.90 MIL/uL Final  . Hemoglobin 04/13/2015 13.6  13.0 - 18.0 g/dL Final  . HCT 04/13/2015 40.3  40.0 - 52.0 % Final  . MCV 04/13/2015 95.4  80.0 - 100.0 fL Final  . MCH 04/13/2015 32.2  26.0 - 34.0 pg Final  . MCHC 04/13/2015 33.8  32.0 - 36.0 g/dL Final  . RDW 04/13/2015 13.6  11.5 - 14.5 % Final  . Platelets 04/13/2015 172  150 - 440 K/uL Final  . Neutrophils Relative % 04/13/2015 73   Final  . Neutro Abs 04/13/2015 5.2  1.4 - 6.5 K/uL Final  . Lymphocytes Relative 04/13/2015 16   Final  . Lymphs Abs 04/13/2015 1.1  1.0 - 3.6 K/uL Final  . Monocytes Relative 04/13/2015 9   Final  . Monocytes Absolute 04/13/2015 0.6  0.2 - 1.0 K/uL Final  . Eosinophils Relative 04/13/2015 3   Final  . Eosinophils Absolute 04/13/2015 0.2  0 - 0.7 K/uL Final  . Basophils Relative 04/13/2015 1   Final  . Basophils Absolute 04/13/2015 0.0  0 - 0.1 K/uL Final  . Sodium 04/13/2015 137   135 - 145 mmol/L Final  . Potassium 04/13/2015 3.5  3.5 - 5.1 mmol/L Final  . Chloride 04/13/2015 107  101 - 111 mmol/L Final  . CO2 04/13/2015 25  22 - 32 mmol/L Final  . Glucose, Bld 04/13/2015 106* 65 - 99 mg/dL Final  . BUN 04/13/2015 13  6 - 20 mg/dL Final  . Creatinine, Ser 04/13/2015 1.28* 0.61 - 1.24 mg/dL Final  . Calcium 04/13/2015 9.2  8.9 - 10.3 mg/dL Final  . Total Protein 04/13/2015 7.1  6.5 - 8.1 g/dL Final  . Albumin 04/13/2015 4.3  3.5 - 5.0 g/dL Final  . AST 04/13/2015 29  15 - 41 U/L Final  . ALT 04/13/2015  37  17 - 63 U/L Final  . Alkaline Phosphatase 04/13/2015 56  38 - 126 U/L Final  . Total Bilirubin 04/13/2015 1.3* 0.3 - 1.2 mg/dL Final  . GFR calc non Af Amer 04/13/2015 53* >60 mL/min Final  . GFR calc Af Amer 04/13/2015 >60  >60 mL/min Final   Comment: (NOTE) The eGFR has been calculated using the CKD EPI equation. This calculation has not been validated in all clinical situations. eGFR's persistently <60 mL/min signify possible Chronic Kidney Disease.   . Anion gap 04/13/2015 5  5 - 15 Final  . LDH 04/13/2015 121  98 - 192 U/L Final  . Uric Acid, Serum 04/13/2015 5.8  4.4 - 7.6 mg/dL Final    Assessment:  Robert Weeks is a 75 y.o. 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  He presented with a right sided pleural effusion, retoperitoneal, and axillary adenopathy in 03/2009.  Pleural fluid on 03/30/2009 suggested the presence of a lymphoma of follicle center cell origin. Cytology was inconclusive.  Bone marrow biopsy on 04/04/2009 was negative.  Left axillary biopsy on 04/17/2009 were consistent with diffuse large B cell lymphoma and follicular lymphoma, grade 3.    He received 8 cycles of RCHOP from 04/24/2009- 09/26/2009.  He tolerated his chemotherapy well. He received maintainance Rituxan every 2 months beginning 12/12/09 until 03/19/2011 (9 of 12 planned treatments).    PET scan on 09/10/2012  revealed stable nodes.  CT scan on 07/10/2014 revealed mesenteric root and retroperitoneal stranding without overt nodularity characteristic of treated lymphoma and stable from prior exams. There was wall thickening at the anal rectal junction. 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  He underwent colonoscopy on 08/25/2014. There was a medium polyp in the ascending colon (tubular adenoma).  There was diverticulosis  in the sigmoid and ascending colon. Random colon biopsies were negative.  He has chronic intermittent diarrhea.  He denies any melena or hematochezia.  He has B12 deficiency.  B12 was 164 (low) on 01/01/2015.  Folate was 6.6 (> 5.9).  He has not started B12 supplimentation.  Symptomatically, he denies any B symptoms. Exam reveals no adenopathy or hepatosplenomegaly.   Plan: 1. Labs today:  CBC with diff, CMP, LDH, uric acid, B12, folate. 2. Discuss B12 deficiency and need for treatment.  Redraw per patient request. 3. Patient states that if he needs B12, he wants to come to clinic when he is already coming for another reason. 4. Port flush on 04/26/2015 and every 6-8 weeks. 5. Chest, abdomen, and pelvic CT scan on 07/10/2015 6.   RTC after CT scans for MD assessment and labs (CBC with diff, CMP, LDH, uric acid).   Lequita Asal, MD  04/13/2015, 11:41 AM

## 2015-04-13 NOTE — Progress Notes (Signed)
Patient did see PCP regarding diarrhea and is under better control now and also had his colonoscopy.  Would like to discuss when he should get a f/u CT.  His appetite is steady but he thinks he should be gaining weight.

## 2015-04-15 ENCOUNTER — Encounter: Payer: Self-pay | Admitting: Hematology and Oncology

## 2015-04-26 ENCOUNTER — Inpatient Hospital Stay: Payer: Medicare Other | Attending: Hematology and Oncology

## 2015-04-26 DIAGNOSIS — C8228 Follicular lymphoma grade III, unspecified, lymph nodes of multiple sites: Secondary | ICD-10-CM | POA: Diagnosis not present

## 2015-04-26 DIAGNOSIS — Z9221 Personal history of antineoplastic chemotherapy: Secondary | ICD-10-CM | POA: Insufficient documentation

## 2015-04-26 DIAGNOSIS — Z452 Encounter for adjustment and management of vascular access device: Secondary | ICD-10-CM | POA: Diagnosis not present

## 2015-04-26 DIAGNOSIS — E538 Deficiency of other specified B group vitamins: Secondary | ICD-10-CM | POA: Diagnosis not present

## 2015-04-26 DIAGNOSIS — Z95828 Presence of other vascular implants and grafts: Secondary | ICD-10-CM

## 2015-04-26 MED ORDER — SODIUM CHLORIDE 0.9% FLUSH
10.0000 mL | INTRAVENOUS | Status: DC | PRN
  Administered 2015-04-26: 10 mL via INTRAVENOUS
  Filled 2015-04-26: qty 10

## 2015-04-26 MED ORDER — HEPARIN SOD (PORK) LOCK FLUSH 100 UNIT/ML IV SOLN
500.0000 [IU] | Freq: Once | INTRAVENOUS | Status: AC
  Administered 2015-04-26: 500 [IU] via INTRAVENOUS

## 2015-05-01 ENCOUNTER — Other Ambulatory Visit: Payer: Medicare Other

## 2015-05-01 ENCOUNTER — Ambulatory Visit: Payer: Medicare Other | Admitting: Hematology and Oncology

## 2015-05-04 ENCOUNTER — Ambulatory Visit: Payer: Medicare Other | Admitting: Hematology and Oncology

## 2015-05-04 ENCOUNTER — Other Ambulatory Visit: Payer: Medicare Other

## 2015-05-07 ENCOUNTER — Other Ambulatory Visit: Payer: Medicare Other

## 2015-05-07 ENCOUNTER — Ambulatory Visit: Payer: Medicare Other | Admitting: Hematology and Oncology

## 2015-05-23 ENCOUNTER — Other Ambulatory Visit: Payer: Self-pay | Admitting: Specialist

## 2015-05-23 DIAGNOSIS — R1310 Dysphagia, unspecified: Secondary | ICD-10-CM

## 2015-05-30 ENCOUNTER — Ambulatory Visit
Admission: RE | Admit: 2015-05-30 | Discharge: 2015-05-30 | Disposition: A | Discharge status: Home / Self Care | Payer: Medicare Other | Source: Ambulatory Visit | Attending: Specialist | Admitting: Specialist

## 2015-05-30 DIAGNOSIS — R1310 Dysphagia, unspecified: Secondary | ICD-10-CM | POA: Insufficient documentation

## 2015-05-30 NOTE — Therapy (Signed)
[place], [place], [address] Phone: [phone]   Fax:     Modified Barium Swallow  Patient Details  Name: Robert Weeks MRN: [id number] Date of Birth: 21-Oct-1940 No Data Recorded  Encounter Date: 05/30/2015      End of Session - 05/30/15 1439    Visit Number 1   Number of Visits 1   Date for SLP Re-Evaluation 05/30/15   SLP Start Time 39   SLP Stop Time  1400   SLP Time Calculation (min) 60 min   Activity Tolerance Patient tolerated treatment well      Past Medical History  Diagnosis Date  . Asthma   . Cancer [facility])     lymphom dx in 2011 in remission  . Hypertension   . COPD (chronic obstructive pulmonary disease) ([place])   . Seizures ([place])   . Gout   . BPH (benign prostatic hyperplasia)   . Elevated PSA   . Erectile dysfunction   . Lymphoma [facility])     Past Surgical History  Procedure Laterality Date  . Colonoscopy with propofol N/A 08/25/2014    Procedure: COLONOSCOPY WITH PROPOFOL;  Surgeon: Hulen Luster, MD;  Location: [facility] ENDOSCOPY;  Service: Gastroenterology;  Laterality: N/A;  . Lymphectomy    . Cystoscopy      There were no vitals filed for this visit.  Visit Diagnosis: Dysphagia - Plan: DG OP Swallowing Func-Medicare/Speech Path, DG OP Swallowing Func-Medicare/Speech Path   Subjective: Patient behavior: (alertness, ability to follow instructions, etc.):The patient is alert and able to answer questions and follow directions.  Patient has been followed by neurology, at his last visit the neurologist did not report neuromuscular problems and the patient's cranial nerve examination was within normal limits.  The patient denies history of stroke or recurrent bronchitis/pneumonia  Chief complaint: voice changes (hoarse, strained, hypophonic); difficulty swallowing solids   Objective:  Radiological Procedure: A videoflouroscopic evaluation of oral-preparatory, reflex  initiation, and pharyngeal phases of the swallow was performed; as well as a screening of the upper esophageal phase.  I. POSTURE: Upright in MBS chair  II. VIEW: Lateral  III. COMPENSATORY STRATEGIES: Effortful swallow- improves hyolaryngeal movement and pharyngeal clearance  IV. BOLUSES ADMINISTERED:   Thin Liquid: 3 cup rim sips   Nectar-thick Liquid: 1 cup rim sip    Puree: 2 teaspoon presentations   Mechanical Soft: 1/4 graham cracker in applesauce  V. RESULTS OF EVALUATION: A. ORAL PREPARATORY PHASE: (The lips, tongue, and velum are observed for strength and coordination)       **Overall Severity Rating: Within normal limits  B. SWALLOW INITIATION/REFLEX: (The reflex is normal if "triggered" by the time the bolus reached the base of the tongue)  **Overall Severity Rating: Moderate-severe; triggers at the valleculae for solids and while falling from the valleculae to the pyriform sinuses with liquids   C. PHARYNGEAL PHASE: (Pharyngeal function is normal if the bolus shows rapid, smooth, and continuous transit through the pharynx and there is no pharyngeal residue after the swallow)  **Overall Severity Rating: Moderate; decreased hyolaryngeal movement,  decreased tongue base retraction and incomplete epiglottic inversion.  There is severe vallecular residue with solids and moderate residue with liquids.  An effortful swallow results in increased hyolaryngeal movement and reduced amount of pharyngeal residue.     D. LARYNGEAL PENETRATION: (Material entering into the laryngeal inlet/vestibule but not aspirated) consistent laryngeal penetration without aspiration.  E. ASPIRATION:  None  F. ESOPHAGEAL PHASE: (Screening  of the upper esophagus) Prominent cervical vertebrae intruding into the hyopharynx and posterior wall of the cervical esophagus  ASSESSMENT: This 75 year old man; with history of COPD, chemo treatment for lymphoma, seizures, voice changes, and difficulty swallowing  solids; is presenting with moderate oropharyngeal dysphagia characterized by delayed pharyngeal swallow initiation, decreased hyolaryngeal movement,  decreased tongue base retraction and incomplete epiglottic inversion. Oral control of the bolus including oral hold, rotary mastication, and anterior to posterior transfer are within normal limits. There is severe vallecular residue with solids and moderate residue with liquids.  An effortful swallow results in increased hyolaryngeal movement and reduced amount of pharyngeal residue.   There is laryngeal penetration without aspiration. The patient would benefit from swallowing exercises to improve pharyngeal clearance such as the Shaker exercise; he has my card and will call to make an appointment for outpatient speech therapy.  In view of the dysphonia, the patient would benefit from referral to ENT to assess vocal fold function.     PLAN/RECOMMENDATIONS:   A. Diet: Continue current diet   B. Swallowing Precautions: Practice "hard" swallow   C. Recommended consultation to: ENT due to persistent dysphonia, determine candidacy for voice therapy   D. Therapy recommendations: speech therapy for swallowing exercises right away.  Voice therapy pending ENT evaluation   E. Results and recommendations were discussed with the patient immediately following the study and the final report routed to the referring MD.          G-Codes - 06-26-2015 1440    Functional Assessment Tool Used MBS, clinical judgment   Functional Limitations Swallowing   Swallow Current Status [id number]) At least 40 percent but less than 60 percent impaired, limited or restricted   Swallow Goal Status [id number]) At least 40 percent but less than 60 percent impaired, limited or restricted   Swallow Discharge Status [phone]) At least 40 percent but less than 60 percent impaired, limited or restricted          Problem List Patient Active Problem List   Diagnosis Date Noted  . BPH  with obstruction/lower urinary tract symptoms 01/23/2015  . Erectile dysfunction of organic origin 01/23/2015  . B12 deficiency 01/05/2015  . Anemia 01/01/2015  . BP (high blood pressure) 08/02/2014  . Lymphoma ([place]) 08/02/2014  . 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 Seizure ([place]) 08/03/2013  . Mechanical and motor problems with internal organs 08/03/2013  . Episode of syncope 08/03/2013  . CAFL (chronic airflow limitation) ([place]) 06/01/2013  . Benign essential HTN 06/01/2013   Leroy Sea, MS/CCC- SLP  Lou Miner 06/26/15, 2:42 PM  [facility] [place], [place], [address] Phone: [phone]   Fax:     Name: Robert Weeks MRN: [id number] Date of Birth: January 13, 1941

## 2015-07-10 ENCOUNTER — Ambulatory Visit
Admission: RE | Admit: 2015-07-10 | Discharge: 2015-07-10 | Disposition: A | Discharge status: Home / Self Care | Payer: Medicare Other | Source: Ambulatory Visit | Attending: Hematology and Oncology | Admitting: Hematology and Oncology

## 2015-07-10 DIAGNOSIS — C8250 Diffuse follicle center lymphoma, unspecified site: Secondary | ICD-10-CM | POA: Diagnosis present

## 2015-07-10 DIAGNOSIS — I714 Abdominal aortic aneurysm, without rupture: Secondary | ICD-10-CM | POA: Insufficient documentation

## 2015-07-12 ENCOUNTER — Ambulatory Visit: Payer: Medicare Other | Admitting: Hematology and Oncology

## 2015-07-13 ENCOUNTER — Inpatient Hospital Stay: Payer: Medicare Other | Attending: Hematology and Oncology | Admitting: Hematology and Oncology

## 2015-07-13 VITALS — BP 156/80 | HR 72 | Temp 96.0°F | Resp 18 | Wt 151.9 lb

## 2015-07-13 DIAGNOSIS — K5730 Diverticulosis of large intestine without perforation or abscess without bleeding: Secondary | ICD-10-CM | POA: Insufficient documentation

## 2015-07-13 DIAGNOSIS — Z8042 Family history of malignant neoplasm of prostate: Secondary | ICD-10-CM | POA: Diagnosis not present

## 2015-07-13 DIAGNOSIS — N400 Enlarged prostate without lower urinary tract symptoms: Secondary | ICD-10-CM | POA: Diagnosis not present

## 2015-07-13 DIAGNOSIS — J449 Chronic obstructive pulmonary disease, unspecified: Secondary | ICD-10-CM | POA: Insufficient documentation

## 2015-07-13 DIAGNOSIS — C8228 Follicular lymphoma grade III, unspecified, lymph nodes of multiple sites: Secondary | ICD-10-CM

## 2015-07-13 DIAGNOSIS — Z86010 Personal history of colonic polyps: Secondary | ICD-10-CM | POA: Diagnosis not present

## 2015-07-13 DIAGNOSIS — I10 Essential (primary) hypertension: Secondary | ICD-10-CM

## 2015-07-13 DIAGNOSIS — M109 Gout, unspecified: Secondary | ICD-10-CM

## 2015-07-13 DIAGNOSIS — E538 Deficiency of other specified B group vitamins: Secondary | ICD-10-CM | POA: Diagnosis not present

## 2015-07-13 DIAGNOSIS — Z79899 Other long term (current) drug therapy: Secondary | ICD-10-CM | POA: Diagnosis not present

## 2015-07-13 DIAGNOSIS — Z87891 Personal history of nicotine dependence: Secondary | ICD-10-CM | POA: Insufficient documentation

## 2015-07-13 DIAGNOSIS — I714 Abdominal aortic aneurysm, without rupture: Secondary | ICD-10-CM | POA: Diagnosis not present

## 2015-07-13 DIAGNOSIS — R197 Diarrhea, unspecified: Secondary | ICD-10-CM | POA: Diagnosis not present

## 2015-07-13 NOTE — Progress Notes (Signed)
[facility] day:  07/13/2015   Chief Complaint: Robert Weeks is a 75 y.o. 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  HPI:   The patient was last seen in the medical oncology clinic on 04/13/2015.  At that time, he denied any B symptoms. Exam revealed no adenopathy or hepatosplenomegaly.   Labs included a normal CBC with diff, LDH, uric acid, B12 and folate.  Comprehensive metabolic panel included a creatinine of 1.28 and a bilirubin of 1.4.  Per prior plan, he underwent chest, abdomen, and pelvic CT scan on 07/10/2015.  Imaging revealed stable abnormal soft tissue along the celiac axis, SMA, and mesenteric root/jejunal mesentery, suggesting treated lymphoma.  There was no suspicious lymphadenopathy in the chest, abdomen, or pelvis.  Spleen was normal.  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  There was a 3.8 x 3.8 cm infrarenal abdominal aortic aneurysm (previously 3.6 x 3.3 cm).  Labs from his primary care physician on 06/22/2015 revealed a hematocrit of 39.1, hemoglobin 13.4, MCV 90.9, platelets 243,000, and WBC 8700.  Comprehensive metabolic panel included a creatinine of 1.4 and bilirubin 1.0.  Symptomatically, he denies any complaints.  He denies any fevers, sweats or weight loss.  He states that he goes to the vein doctor once a year.     Past Medical History  Diagnosis Date  . Asthma   . Cancer [facility])     lymphom dx in 2011 in remission  . Hypertension   . COPD (chronic obstructive pulmonary disease) ([place])   . Seizures ([place])   . Gout   . BPH (benign prostatic hyperplasia)   . Elevated PSA   . Erectile dysfunction   . Lymphoma [facility])     Past Surgical History  Procedure Laterality Date  . Colonoscopy with propofol N/A 08/25/2014    Procedure: COLONOSCOPY WITH PROPOFOL;  Surgeon:  Hulen Luster, MD;  Location: [facility] ENDOSCOPY;  Service: Gastroenterology;  Laterality: N/A;  . Lymphectomy    . Cystoscopy      Family History  Problem Relation Age of Onset  . Kidney disease Father 85  . Prostate cancer Neg Hx     Social History:  reports that he quit smoking about 6 years ago. He does not have any smokeless tobacco history on file. He reports that he drinks alcohol. His drug history is not on file.  The patient is alone today.  Allergies:  Allergies  Allergen Reactions  . Finasteride     Current Medications: Current Outpatient Prescriptions  Medication Sig Dispense Refill  . ADVAIR DISKUS 250-50 MCG/DOSE AEPB     . lamoTRIgine (LAMICTAL) 25 MG tablet     . levETIRAcetam (KEPPRA) 750 MG tablet     . lisinopril (PRINIVIL,ZESTRIL) 5 MG tablet     . potassium chloride SA (K-DUR,KLOR-CON) 20 MEQ tablet Take 20 mEq by mouth daily.    . sildenafil (VIAGRA) 100 MG tablet Take 1 tablet (100 mg total) by mouth daily as needed for erectile dysfunction. 6 tablet 0   No current facility-administered medications for this visit.    Review of Systems:  GENERAL:  Feels good.  No fevers or sweats.  Weight up 3 pounds. PERFORMANCE STATUS (ECOG):  1 HEENT:  No visual changes, runny nose, sore throat, mouth sores or tenderness. Lungs: No shortness of breath or cough.  No  hemoptysis. Cardiac:  No chest pain, palpitations, orthopnea, or PND. GI:  Eating well.  No nausea, diarrhea, vomiting, constipation, melena or hematochezia. GU:  Enlarged prostate.  No urgency, frequency, dysuria, or hematuria. Musculoskeletal:  No back pain.  No joint pain.  No muscle tenderness. Extremities:  No pain or swelling. Skin:  No rashes or skin changes. Neuro:  No headache, numbness or weakness, balance or coordination issues. Endocrine:  No diabetes, thyroid issues, hot flashes or night sweats. Psych:  No mood changes, depression or anxiety. Pain:  No focal pain. Review of systems:  All other  systems reviewed and found to be negative.  Physical Exam: Blood pressure 156/80, pulse 72, temperature 96 F (35.6 C), temperature source Tympanic, resp. rate 18, weight 151 lb 14.4 oz (68.9 kg). GENERAL:  Thin gentleman sitting comfortably in the exam room in no acute distress. MENTAL STATUS:  Alert and oriented to person, place and time. HEAD:  Short gray hair.  Graying goatee.  Normocephalic, atraumatic, face symmetric, no Cushingoid features. EYES:  Glasses.  Brown eyes.  Pupils equal round and reactive to light and accomodation.  No conjunctivitis or scleral icterus. ENT:  Oropharynx clear without lesion.  Dentures.  Tongue normal. Mucous membranes moist.  RESPIRATORY:  Clear to auscultation without rales, wheezes or rhonchi. CARDIOVASCULAR:  Regular rate and rhythm without murmur, rub or gallop. ABDOMEN:  Soft, non-tender, with active bowel sounds, and no hepatosplenomegaly.  No masses. SKIN:  No rashes, ulcers or lesions. EXTREMITIES: No edema, no skin discoloration or tenderness.  No palpable cords. LYMPH NODES: No palpable cervical, supraclavicular, axillary or inguinal adenopathy  NEUROLOGICAL: Unremarkable. PSYCH:  Appropriate.  No visits with results within 3 Day(s) from this visit. Latest known visit with results is:  Orders Only on 04/13/2015  Component Date Value Ref Range Status  . Vitamin B-12 04/13/2015 419  180 - 914 pg/mL Final   Comment: (NOTE) This assay is not validated for testing neonatal or myeloproliferative syndrome specimens for Vitamin B12 levels. Performed at [facility]   . Folate 04/13/2015 9.8  >5.9 ng/mL Final    Assessment:  Robert Weeks is a 75 y.o. 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  He presented with a right sided pleural effusion, retoperitoneal, and axillary adenopathy in 03/2009.  Pleural fluid on 03/30/2009 suggested the presence of a lymphoma of follicle center cell  origin. Cytology was inconclusive.  Bone marrow biopsy on 04/04/2009 was negative.  Left axillary biopsy on 04/17/2009 were consistent with diffuse large B cell lymphoma and follicular lymphoma, grade 3.    He received 8 cycles of RCHOP from 04/24/2009- 09/26/2009.  He tolerated his chemotherapy well. He received maintainance Rituxan every 2 months beginning 12/12/09 until 03/19/2011 (9 of 12 planned treatments).    PET scan on 09/10/2012 revealed stable nodes.  CT scan on 07/10/2014 revealed mesenteric root and retroperitoneal stranding without overt nodularity characteristic of treated lymphoma and stable from prior exams. There was wall thickening at the anal rectal junction. 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  Chest, abdomen, and pelvic CT scan on 07/10/2015 revealed stable abnormal soft tissue along the celiac axis, SMA, and mesenteric root/jejunal mesentery, suggesting treated lymphoma.  There was no suspicious lymphadenopathy.  He has a 3.8 cm infrarenal abdominal aortic aneurysm.   He underwent colonoscopy on 08/25/2014. There was a medium polyp in the ascending colon (tubular adenoma).  There was diverticulosis  in the sigmoid  and ascending colon. Random colon biopsies were negative.  He has chronic intermittent diarrhea.  He denies any melena or hematochezia.  He has B12 deficiency.  B12 was 164 (low) on 01/01/2015.  Folate was 6.6 (> 5.9).  Follow-up B12 was 419 (normal) on 04/13/2015.  Symptomatically, he denies any B symptoms. Exam reveals no adenopathy or hepatosplenomegaly.   Plan: 1.  Review recent CT scans.  No evidence of active lymphoma.  Copy of scans for patient. 2.  Patient declined labs today.  Review available labs from 06/22/2015. 3.  Schedule port-a-cath removal. 4.  RTC in 6 months for MD assessment and labs (CBC with diff, CMP, LDH, uric acid, retic, B12).   Lequita Asal, MD  07/13/2015, 11:18 AM

## 2015-07-15 ENCOUNTER — Encounter: Payer: Self-pay | Admitting: Hematology and Oncology

## 2015-07-17 ENCOUNTER — Telehealth: Payer: Self-pay | Admitting: *Deleted

## 2015-07-17 NOTE — Telephone Encounter (Signed)
Robert Weeks at surgical office. They got the order to d/c port and they have set him up an appt and they contacted pt to let him know date and time also.

## 2015-07-24 ENCOUNTER — Emergency Department: Payer: Medicare Other

## 2015-07-24 ENCOUNTER — Emergency Department
Admission: EM | Admit: 2015-07-24 | Discharge: 2015-07-24 | Disposition: A | Discharge status: Home / Self Care | Payer: Medicare Other | Attending: Emergency Medicine | Admitting: Emergency Medicine

## 2015-07-24 DIAGNOSIS — C801 Malignant (primary) neoplasm, unspecified: Secondary | ICD-10-CM | POA: Diagnosis not present

## 2015-07-24 DIAGNOSIS — J449 Chronic obstructive pulmonary disease, unspecified: Secondary | ICD-10-CM | POA: Diagnosis not present

## 2015-07-24 DIAGNOSIS — F068 Other specified mental disorders due to known physiological condition: Secondary | ICD-10-CM

## 2015-07-24 DIAGNOSIS — E876 Hypokalemia: Secondary | ICD-10-CM | POA: Insufficient documentation

## 2015-07-24 DIAGNOSIS — F0391 Unspecified dementia with behavioral disturbance: Secondary | ICD-10-CM

## 2015-07-24 DIAGNOSIS — J45909 Unspecified asthma, uncomplicated: Secondary | ICD-10-CM | POA: Diagnosis not present

## 2015-07-24 DIAGNOSIS — Z046 Encounter for general psychiatric examination, requested by authority: Secondary | ICD-10-CM

## 2015-07-24 DIAGNOSIS — I10 Essential (primary) hypertension: Secondary | ICD-10-CM | POA: Insufficient documentation

## 2015-07-24 DIAGNOSIS — R413 Other amnesia: Secondary | ICD-10-CM

## 2015-07-24 DIAGNOSIS — Z8669 Personal history of other diseases of the nervous system and sense organs: Secondary | ICD-10-CM | POA: Insufficient documentation

## 2015-07-24 DIAGNOSIS — Z87891 Personal history of nicotine dependence: Secondary | ICD-10-CM | POA: Insufficient documentation

## 2015-07-24 DIAGNOSIS — F03918 Unspecified dementia, unspecified severity, with other behavioral disturbance: Secondary | ICD-10-CM

## 2015-07-24 LAB — COMPREHENSIVE METABOLIC PANEL
ALBUMIN: 4.5 g/dL (ref 3.5–5.0)
ALT: 26 U/L (ref 17–63)
ANION GAP: 7 (ref 5–15)
AST: 26 U/L (ref 15–41)
Alkaline Phosphatase: 70 U/L (ref 38–126)
BUN: 16 mg/dL (ref 6–20)
CHLORIDE: 103 mmol/L (ref 101–111)
CO2: 29 mmol/L (ref 22–32)
Calcium: 9 mg/dL (ref 8.9–10.3)
Creatinine, Ser: 1.44 mg/dL — ABNORMAL HIGH (ref 0.61–1.24)
GFR calc Af Amer: 53 mL/min — ABNORMAL LOW (ref >60)
GFR calc non Af Amer: 46 mL/min — ABNORMAL LOW (ref >60)
GLUCOSE: 108 mg/dL — AB (ref 65–99)
POTASSIUM: 2.8 mmol/L — AB (ref 3.5–5.1)
SODIUM: 139 mmol/L (ref 135–145)
TOTAL PROTEIN: 7.5 g/dL (ref 6.5–8.1)
Total Bilirubin: 1.2 mg/dL (ref 0.3–1.2)

## 2015-07-24 LAB — URINALYSIS COMPLETE WITH MICROSCOPIC (ARMC ONLY)
BILIRUBIN URINE: NEGATIVE
Bacteria, UA: NONE SEEN
Glucose, UA: NEGATIVE mg/dL
KETONES UR: NEGATIVE mg/dL
Nitrite: NEGATIVE
PH: 6 (ref 5.0–8.0)
Protein, ur: NEGATIVE mg/dL
SPECIFIC GRAVITY, URINE: 1.01 (ref 1.005–1.030)

## 2015-07-24 LAB — URINE DRUG SCREEN, QUALITATIVE (ARMC ONLY)
AMPHETAMINES, UR SCREEN: NOT DETECTED
BARBITURATES, UR SCREEN: NOT DETECTED
BENZODIAZEPINE, UR SCRN: NOT DETECTED
CANNABINOID 50 NG, UR ~~LOC~~: NOT DETECTED
Cocaine Metabolite,Ur ~~LOC~~: NOT DETECTED
MDMA (Ecstasy)Ur Screen: NOT DETECTED
Methadone Scn, Ur: NOT DETECTED
OPIATE, UR SCREEN: NOT DETECTED
PHENCYCLIDINE (PCP) UR S: NOT DETECTED
Tricyclic, Ur Screen: NOT DETECTED

## 2015-07-24 LAB — CBC WITH DIFFERENTIAL/PLATELET
BASOS ABS: 0 10*3/uL (ref 0–0.1)
BASOS PCT: 0 %
EOS ABS: 0.4 10*3/uL (ref 0–0.7)
Eosinophils Relative: 3 %
HEMATOCRIT: 43 % (ref 40.0–52.0)
Hemoglobin: 14.3 g/dL (ref 13.0–18.0)
LYMPHS PCT: 14 %
Lymphs Abs: 1.7 10*3/uL (ref 1.0–3.6)
MCH: 31 pg (ref 26.0–34.0)
MCHC: 33.3 g/dL (ref 32.0–36.0)
MCV: 93.3 fL (ref 80.0–100.0)
MONO ABS: 1 10*3/uL (ref 0.2–1.0)
Monocytes Relative: 8 %
NEUTROS ABS: 9 10*3/uL — AB (ref 1.4–6.5)
Neutrophils Relative %: 75 %
PLATELETS: 190 10*3/uL (ref 150–440)
RBC: 4.61 MIL/uL (ref 4.40–5.90)
RDW: 14.7 % — AB (ref 11.5–14.5)
WBC: 12.1 10*3/uL — ABNORMAL HIGH (ref 3.8–10.6)

## 2015-07-24 LAB — TROPONIN I

## 2015-07-24 LAB — SALICYLATE LEVEL: Salicylate Lvl: <4 mg/dL (ref 2.8–30.0)

## 2015-07-24 LAB — ACETAMINOPHEN LEVEL: Acetaminophen (Tylenol), Serum: <10 ug/mL — ABNORMAL LOW (ref 10–30)

## 2015-07-24 LAB — ETHANOL: Alcohol, Ethyl (B): <5 mg/dL (ref <5)

## 2015-07-24 MED ORDER — FOSFOMYCIN TROMETHAMINE 3 G PO PACK
3.0000 g | PACK | Freq: Once | ORAL | Status: AC
  Administered 2015-07-24: 3 g via ORAL
  Filled 2015-07-24: qty 3

## 2015-07-24 MED ORDER — POTASSIUM CHLORIDE CRYS ER 20 MEQ PO TBCR
40.0000 meq | EXTENDED_RELEASE_TABLET | Freq: Once | ORAL | Status: AC
  Administered 2015-07-24: 40 meq via ORAL
  Filled 2015-07-24: qty 2

## 2015-07-24 MED ORDER — LISINOPRIL 5 MG PO TABS
5.0000 mg | ORAL_TABLET | Freq: Once | ORAL | Status: AC
  Administered 2015-07-24: 5 mg via ORAL
  Filled 2015-07-24: qty 1

## 2015-07-24 MED ORDER — LAMOTRIGINE 25 MG PO TABS: 50.0000 mg | ORAL_TABLET | Freq: Every day | ORAL | Status: DC

## 2015-07-24 MED ORDER — LEVETIRACETAM 750 MG PO TABS
750.0000 mg | ORAL_TABLET | Freq: Two times a day (BID) | ORAL | Status: DC
  Administered 2015-07-24: 750 mg via ORAL
  Filled 2015-07-24: qty 1

## 2015-07-24 MED ORDER — LEVETIRACETAM 500 MG PO TABS
ORAL_TABLET | ORAL | Status: AC
  Administered 2015-07-24: 750 mg via ORAL
  Filled 2015-07-24: qty 2

## 2015-07-24 NOTE — Consult Note (Signed)
[place] Consult   Reason for Consult:  Consult for 75 year old man currently on involuntary commitment involving behavior problems at night Referring Physician:  Darl Householder Patient Identification: Robert Weeks MRN:  [id number] Principal Diagnosis: Psychosis in elderly with behavioral disturbance Diagnosis:   Patient Active Problem List   Diagnosis Date Noted  . 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 Psychosis in elderly with behavioral disturbance [F03.91] 07/24/2015  . Involuntary commitment [Z04.6] 07/24/2015  . BPH with obstruction/lower urinary tract symptoms [N40.1] 01/23/2015  . Erectile dysfunction of organic origin [N52.8] 01/23/2015  . B12 deficiency [E53.8] 01/05/2015  . Anemia [D64.9] 01/01/2015  . BP (high blood pressure) [I10] 08/02/2014  . Lymphoma ([place]) [C85.90] 08/02/2014  . 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 Seizure ([place]) [R56.9] 08/03/2013  . Mechanical and motor problems with internal organs [R68.89] 08/03/2013  . Episode of syncope [R55] 08/03/2013  . 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 Benign essential HTN [I10] 06/01/2013    Total Time spent with patient: 1 hour  Subjective:   Robert Weeks is a 75 y.o. male patient admitted with "I've been with this woman 30 years".  HPI:  Patient interviewed. Paperwork including involuntary commitment reviewed. Labs and vitals reviewed. Case discussed with emergency room physician. 30 year old man petitioned by girlfriend with reports that he has been paranoid and agitated and at times possibly threatening at night time. She describes episodes in which she is accusing her of having sex while he is in the house with other min. She reports that he has been threatening at times although no report that he is actually physically hurt her. She alleges that he is drinking and that is also contributing to part of the problem. Patient clearly believes  that his long-term girlfriend with whom he has been together for 30 years, is having sexual relations with some mysterious other man while he is in the house. He says that she makes sounds at night to convince him of this. He admits that he goes around the house at night checking things. He denies that he has been threatening or had any thoughts about hurting anyone. He says that he is convinced enough of this that he has decided to split up with his girlfriend. He says his mood feels fine. He does not really see himself as having a major sleep problem. He doesn't notice very much in the way of memory loss. Denies any hallucinations that he is aware of. Patient is not currently on any psychiatric medicine. He does have seizures and is treated with antiseizure medicine. He also admits however that he's been drinking 2 or 3 times a week one or 2 drinks at night.  Social history: Patient is retired and lives with his long-term girlfriend in a house that he purchased. He has adult children and she does as well.  Medical history: He was diagnosed with lymphoma some years ago and had chemotherapy and appears to be in remission. Has a history of hypertension as well. Relatively new onset seizures he's had about 4 of them starting in 2014.  Substance abuse history: Drinks a couple drinks by his report at night a couple times a week. Denies that he is abusing any other drugs denies any history of substance abuse is an issue.  Past Psychiatric History: Patient denies any history of psychiatric treatment. Denies suicidal behavior denies psychiatric hospitalization.  Risk to Self: Suicidal Ideation: No Suicidal Intent: No Is patient at risk for suicide?:  No Suicidal Plan?: No Access to Means: No What has been your use of drugs/alcohol within the last 12 months?: denied use How many times?: 0 Other Self Harm Risks: denied Triggers for Past Attempts: None known Intentional Self Injurious Behavior: None Risk to  Others: Homicidal Ideation: No Thoughts of Harm to Others: No Current Homicidal Intent: No Current Homicidal Plan: No Access to Homicidal Means: No Identified Victim: None identified History of harm to others?: No Assessment of Violence: None Noted Violent Behavior Description: denied Does patient have access to weapons?: Yes (Comment) Criminal Charges Pending?: No Does patient have a court date: No Prior Inpatient Therapy: Prior Inpatient Therapy: No Prior Therapy Dates: n/a Prior Therapy Facilty/Provider(s): n/a Reason for Treatment: n/a Prior Outpatient Therapy: Prior Outpatient Therapy: No Prior Therapy Dates: n/a Prior Therapy Facilty/Provider(s): n/a Reason for Treatment: n/a Does patient have an ACCT team?: No Does patient have Intensive In-House Services?  : No Does patient have [facility] services? : No Does patient have P4CC services?: No  Past Medical History:  Past Medical History  Diagnosis Date  . Asthma   . Cancer [facility])     lymphom dx in 2011 in remission  . Hypertension   . COPD (chronic obstructive pulmonary disease) ([place])   . Seizures ([place])   . Gout   . BPH (benign prostatic hyperplasia)   . Elevated PSA   . Erectile dysfunction   . Lymphoma [facility])     Past Surgical History  Procedure Laterality Date  . Colonoscopy with propofol N/A 08/25/2014    Procedure: COLONOSCOPY WITH PROPOFOL;  Surgeon: Hulen Luster, MD;  Location: [facility] ENDOSCOPY;  Service: Gastroenterology;  Laterality: N/A;  . Lymphectomy    . Cystoscopy     Family History:  Family History  Problem Relation Age of Onset  . Kidney disease Father 63  . Prostate cancer Neg Hx    Family Psychiatric  History: He is not aware of any family history of any kind of mental health problems Social History:  History  Alcohol Use  . 0.0 oz/week  . 0 Standard drinks or equivalent per week     History  Drug Use Not on file    Social History   Social History  . Marital Status: Married    Spouse  Name: N/A  . Number of Children: N/A  . Years of Education: N/A   Social History Main Topics  . Smoking status: Former Smoker    Quit date: 05/01/2009  . Smokeless tobacco: None  . Alcohol Use: 0.0 oz/week    0 Standard drinks or equivalent per week  . Drug Use: None  . Sexual Activity: Not Asked   Other Topics Concern  . None   Social History Narrative   Additional Social History:    Allergies:   Allergies  Allergen Reactions  . Finasteride     Labs:  Results for orders placed or performed during the hospital encounter of 07/24/15 (from the past 48 hour(s))  CBC with Differential     Status: Abnormal   Collection Time: 07/24/15  4:37 AM  Result Value Ref Range   WBC 12.1 (H) 3.8 - 10.6 K/uL   RBC 4.61 4.40 - 5.90 MIL/uL   Hemoglobin 14.3 13.0 - 18.0 g/dL   HCT 43.0 40.0 - 52.0 %   MCV 93.3 80.0 - 100.0 fL   MCH 31.0 26.0 - 34.0 pg   MCHC 33.3 32.0 - 36.0 g/dL   RDW 14.7 (H) 11.5 - 14.5 %  Platelets 190 150 - 440 K/uL   Neutrophils Relative % 75 %   Lymphocytes Relative 14 %   Monocytes Relative 8 %   Eosinophils Relative 3 %   Basophils Relative 0 %   Neutro Abs 9.0 (H) 1.4 - 6.5 K/uL   Lymphs Abs 1.7 1.0 - 3.6 K/uL   Monocytes Absolute 1.0 0.2 - 1.0 K/uL   Eosinophils Absolute 0.4 0 - 0.7 K/uL   Basophils Absolute 0.0 0 - 0.1 K/uL  Comprehensive metabolic panel     Status: Abnormal   Collection Time: 07/24/15  4:37 AM  Result Value Ref Range   Sodium 139 135 - 145 mmol/L   Potassium 2.8 (LL) 3.5 - 5.1 mmol/L    Comment: CRITICAL RESULT CALLED TO, READ BACK BY AND VERIFIED WITH BUTCH WOODS ON 07/24/15 AT 0526 BY TLB    Chloride 103 101 - 111 mmol/L   CO2 29 22 - 32 mmol/L   Glucose, Bld 108 (H) 65 - 99 mg/dL   BUN 16 6 - 20 mg/dL   Creatinine, Ser 1.44 (H) 0.61 - 1.24 mg/dL   Calcium 9.0 8.9 - 10.3 mg/dL   Total Protein 7.5 6.5 - 8.1 g/dL   Albumin 4.5 3.5 - 5.0 g/dL   AST 26 15 - 41 U/L   ALT 26 17 - 63 U/L   Alkaline Phosphatase 70 38 - 126 U/L    Total Bilirubin 1.2 0.3 - 1.2 mg/dL   GFR calc non Af Amer 46 (L) >60 mL/min   GFR calc Af Amer 53 (L) >60 mL/min    Comment: (NOTE) The eGFR has been calculated using the CKD EPI equation. This calculation has not been validated in all clinical situations. eGFR's persistently <60 mL/min signify possible Chronic Kidney Disease.    Anion gap 7 5 - 15  Ethanol     Status: None   Collection Time: 07/24/15  4:37 AM  Result Value Ref Range   Alcohol, Ethyl (B) <5 <5 mg/dL    Comment:        LOWEST DETECTABLE LIMIT FOR SERUM ALCOHOL IS 5 mg/dL FOR MEDICAL PURPOSES ONLY   Acetaminophen level     Status: Abnormal   Collection Time: 07/24/15  4:37 AM  Result Value Ref Range   Acetaminophen (Tylenol), Serum <10 (L) 10 - 30 ug/mL    Comment:        THERAPEUTIC CONCENTRATIONS VARY SIGNIFICANTLY. A RANGE OF 10-30 ug/mL MAY BE AN EFFECTIVE CONCENTRATION FOR MANY PATIENTS. HOWEVER, SOME ARE BEST TREATED AT CONCENTRATIONS OUTSIDE THIS RANGE. ACETAMINOPHEN CONCENTRATIONS >150 ug/mL AT 4 HOURS AFTER INGESTION AND >50 ug/mL AT 12 HOURS AFTER INGESTION ARE OFTEN ASSOCIATED WITH TOXIC REACTIONS.   Salicylate level     Status: None   Collection Time: 07/24/15  4:37 AM  Result Value Ref Range   Salicylate Lvl <0.9 2.8 - 30.0 mg/dL  Troponin I     Status: None   Collection Time: 07/24/15  4:37 AM  Result Value Ref Range   Troponin I <0.03 <0.031 ng/mL    Comment:        NO INDICATION OF MYOCARDIAL INJURY.   Urinalysis complete, with microscopic ([facility] only)     Status: Abnormal   Collection Time: 07/24/15  5:20 AM  Result Value Ref Range   Color, Urine YELLOW (A) YELLOW   APPearance CLEAR (A) CLEAR   Glucose, UA NEGATIVE NEGATIVE mg/dL   Bilirubin Urine NEGATIVE NEGATIVE   Ketones, ur NEGATIVE NEGATIVE mg/dL  Specific Gravity, Urine 1.010 1.005 - 1.030   Hgb urine dipstick 2+ (A) NEGATIVE   pH 6.0 5.0 - 8.0   Protein, ur NEGATIVE NEGATIVE mg/dL   Nitrite NEGATIVE NEGATIVE    Leukocytes, UA TRACE (A) NEGATIVE   RBC / HPF 0-5 0 - 5 RBC/hpf   WBC, UA 0-5 0 - 5 WBC/hpf   Bacteria, UA NONE SEEN NONE SEEN   Squamous Epithelial / LPF 0-5 (A) NONE SEEN  Urine Drug Screen, Qualitative ([facility] only)     Status: None   Collection Time: 07/24/15  5:20 AM  Result Value Ref Range   Tricyclic, Ur Screen NONE DETECTED NONE DETECTED   Amphetamines, Ur Screen NONE DETECTED NONE DETECTED   MDMA (Ecstasy)Ur Screen NONE DETECTED NONE DETECTED   Cocaine Metabolite,Ur [place] NONE DETECTED NONE DETECTED   Opiate, Ur Screen NONE DETECTED NONE DETECTED   Phencyclidine (PCP) Ur S NONE DETECTED NONE DETECTED   Cannabinoid 50 Ng, Ur [place] NONE DETECTED NONE DETECTED   Barbiturates, Ur Screen NONE DETECTED NONE DETECTED   Benzodiazepine, Ur Scrn NONE DETECTED NONE DETECTED   Methadone Scn, Ur NONE DETECTED NONE DETECTED    Comment: (NOTE) 353  Tricyclics, urine               Cutoff 1000 ng/mL 200  Amphetamines, urine             Cutoff 1000 ng/mL 300  MDMA (Ecstasy), urine           Cutoff 500 ng/mL 400  Cocaine Metabolite, urine       Cutoff 300 ng/mL 500  Opiate, urine                   Cutoff 300 ng/mL 600  Phencyclidine (PCP), urine      Cutoff 25 ng/mL 700  Cannabinoid, urine              Cutoff 50 ng/mL 800  Barbiturates, urine             Cutoff 200 ng/mL 900  Benzodiazepine, urine           Cutoff 200 ng/mL 1000 Methadone, urine                Cutoff 300 ng/mL 1100 1200 The urine drug screen provides only a preliminary, unconfirmed 1300 analytical test result and should not be used for non-medical 1400 purposes. Clinical consideration and professional judgment should 1500 be applied to any positive drug screen result due to possible 1600 interfering substances. A more specific alternate chemical method 1700 must be used in order to obtain a confirmed analytical result.  1800 Gas chromato graphy / mass spectrometry (GC/MS) is the preferred 1900 confirmatory method.     Current  Facility-Administered Medications  Medication Dose Route Frequency Provider Last Rate Last Dose  . lamoTRIgine (LAMICTAL) tablet 50 mg  50 mg Oral QHS Paulette Blanch, MD      . levETIRAcetam (KEPPRA) tablet 750 mg  750 mg Oral BID Paulette Blanch, MD   750 mg at 07/24/15 1013   Current Outpatient Prescriptions  Medication Sig Dispense Refill  . ADVAIR DISKUS 250-50 MCG/DOSE AEPB Inhale 1 puff into the lungs 2 (two) times daily.     Marland Kitchen lamoTRIgine (LAMICTAL) 25 MG tablet Take 50 mg by mouth 2 (two) times daily.     Marland Kitchen levETIRAcetam (KEPPRA) 750 MG tablet Take 750 mg by mouth 2 (two) times daily.     Marland Kitchen lisinopril (  PRINIVIL,ZESTRIL) 5 MG tablet Take 5 mg by mouth daily.     . potassium chloride SA (K-DUR,KLOR-CON) 20 MEQ tablet Take 20 mEq by mouth daily.    . sildenafil (VIAGRA) 100 MG tablet Take 1 tablet (100 mg total) by mouth daily as needed for erectile dysfunction. 6 tablet 0    Musculoskeletal: Strength & Muscle Tone: within normal limits Gait & Station: normal Patient leans: N/A  Psychiatric Specialty Exam: Physical Exam  Vitals reviewed. Constitutional: He appears well-developed and well-nourished.  HENT:  Head: Normocephalic and atraumatic.  Eyes: Conjunctivae are normal. Pupils are equal, round, and reactive to light.  Neck: Normal range of motion.  Cardiovascular: Normal rate and normal heart sounds.   Respiratory: He is in respiratory distress.  GI: Soft.  Musculoskeletal: Normal range of motion.  Neurological: He is alert.  Skin: Skin is warm and dry.  Psychiatric: He has a normal mood and affect. His speech is normal and behavior is normal. Judgment and thought content normal. Cognition and memory are normal.    Review of Systems  Constitutional: Negative.   HENT: Negative.   Eyes: Negative.   Respiratory: Negative.   Cardiovascular: Negative.   Gastrointestinal: Negative.   Musculoskeletal: Negative.   Skin: Negative.   Neurological: Negative.    Psychiatric/Behavioral: Positive for memory loss. Negative for depression, suicidal ideas, hallucinations and substance abuse. The patient is nervous/anxious and has insomnia.     Blood pressure 147/91, pulse 76, temperature 98 F (36.7 C), temperature source Oral, resp. rate 18, height 5' 10"  (1.778 m), weight 74.844 kg (165 lb), SpO2 96 %.Body mass index is 23.68 kg/(m^2).  General Appearance: Fairly Groomed  Eye Contact:  Good  Speech:  Clear and Coherent  Volume:  Normal  Mood:  Euthymic  Affect:  Congruent  Thought Process:  Goal Directed  Orientation:  Full (Time, Place, and Person)  Thought Content:  Illogical  Suicidal Thoughts:  No  Homicidal Thoughts:  No  Memory:  Immediate;   Good Recent;   Poor Remote;   Fair  Judgement:  Impaired  Insight:  Lacking  Psychomotor Activity:  Normal  Concentration:  Concentration: Fair  Recall:  [employer] of Knowledge:  Fair  Language:  Fair  Akathisia:  No  Handed:  Right  AIMS (if indicated):     Assets:  Agricultural consultant Housing Resilience Social Support  ADL's:  Intact  Cognition:  Impaired,  Mild  Sleep:        Treatment Plan Summary: Plan 75 year old man who is currently lucid and alert. On Mini-Mental Status he scores a 27 missing only the memory of the 3 objects. He gives a clear and useful history and is knowledgeable about his medical problems. On the other hand I suspect that what his girlfriend alleges is probably true. He is probably having sundowning like behavior and getting confused and paranoid at night. I tried to educate him a little bit about this but of course he simply argued with May and justified his behavior. Nevertheless at this point the patient is lucid enough and does not represent a significant acute dangerousness. He can be taken off involuntary commitment. Case reviewed with emergency room doctor. Patient strongly encouraged to follow-up with his outpatient  neurologist and about this. No medications prescribed.  Disposition: Patient does not meet criteria for psychiatric inpatient admission. Supportive therapy provided about ongoing stressors.  Alethia Berthold, MD 07/24/2015 3:25 PM

## 2015-07-24 NOTE — ED Notes (Signed)
Pt ate breakfast tray and is resting

## 2015-07-24 NOTE — ED Notes (Signed)
Pt given F/U info about [facility].
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
## 2015-07-24 NOTE — ED Notes (Signed)
Patient brought to ED via [employer]PD for IVC. Papers indicate that he is a danger to himself and others; hallucinations. Fiance is the one who took papers out.
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
## 2015-07-24 NOTE — Discharge Instructions (Signed)
See your doctor.   Recheck potassium level in a week.   Eat normally.   Return to ER if you have thoughts of harming yourself or others, hallucinations.

## 2015-07-24 NOTE — ED Notes (Signed)
Pt given newspaper to read

## 2015-07-24 NOTE — ED Notes (Signed)
Pt given breakfast tray

## 2015-07-24 NOTE — ED Provider Notes (Signed)
  Physical Exam  BP 147/91 mmHg  Pulse 76  Temp(Src) 98 F (36.7 C) (Oral)  Resp 18  Ht 5\' 10"  (1.778 m)  Wt 165 lb (74.844 kg)  BMI 23.68 kg/m2  SpO2 96%  Physical Exam  ED Course  Procedures  MDM Patient seen by Dr. Weber Cooks. IVC rescinded by him. Likely an element of dementia. Vitals stable. Patient well appearing, K 2.8, supplemented. Doesn't need geripscyh placement as per Dr. Weber Cooks. Will dc home.   Wandra Arthurs, MD 07/24/15 [phone]

## 2015-07-30 ENCOUNTER — Encounter: Payer: Self-pay | Admitting: *Deleted

## 2015-07-31 ENCOUNTER — Encounter: Payer: Self-pay | Admitting: General Surgery

## 2015-07-31 ENCOUNTER — Ambulatory Visit (INDEPENDENT_AMBULATORY_CARE_PROVIDER_SITE_OTHER): Payer: Medicare Other | Admitting: General Surgery

## 2015-07-31 VITALS — BP 110/68 | HR 72 | Resp 12 | Ht 69.0 in | Wt 149.0 lb

## 2015-07-31 DIAGNOSIS — C8228 Follicular lymphoma grade III, unspecified, lymph nodes of multiple sites: Secondary | ICD-10-CM

## 2015-07-31 NOTE — Progress Notes (Signed)
This is a 75 year old male here today for his port removal.   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 Oncology has cleared him to have the port removed.   Procedure Venous Access Device Removal:  Anesthetic: 10 mL 1.0% Xylocaine and 0.5% Marcaine  Prep: Chloro Prep  Description:  After local anesthetic and prep, the area was sterilely draped out.  The prior incision was opened and the capsule around the port was incised.    The port was freed and the three anchoring stiches were cut and removed. The port and catheter were removed in their entirety.  The incision was closed with 3.0 Vicryl in subcutaneous tissue and the skin was closed with 4.0 Vicryl  subcuticular stitch. The area was sealed with Dermabond.    Procedure was well tolerated and the pt was informed regarding wound care.  Follow-up as needed.          PCP:  Ola Spurr This information has been scribed by Gaspar Cola CMA.

## 2015-07-31 NOTE — Patient Instructions (Addendum)
Return as needed.The patient is aware to call back for any questions or concerns.  

## 2015-08-08 ENCOUNTER — Encounter: Payer: Self-pay | Admitting: General Surgery

## 2015-09-11 ENCOUNTER — Encounter: Payer: Self-pay | Admitting: General Surgery

## 2016-01-10 ENCOUNTER — Other Ambulatory Visit: Payer: Self-pay | Admitting: *Deleted

## 2016-01-10 DIAGNOSIS — C8200 Follicular lymphoma grade I, unspecified site: Secondary | ICD-10-CM

## 2016-01-13 NOTE — Progress Notes (Signed)
[facility] day:  01/14/16   Chief Complaint: Robert Weeks is a 75 y.o. 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  HPI:   The patient was last seen in the medical oncology clinic on 07/13/2015.  At that time, he denied any B symptoms. Exam revealed no adenopathy or hepatosplenomegaly.   Outside labs from 06/22/2015 revealed a hematocrit of 39.1, hemoglobin 13.4, MCV 90.9, platelets 243,000, and WBC 8700.  Comprehensive metabolic panel included a creatinine of 1.4 and bilirubin 1.0.  Chest, abdomen, and pelvic CT scan on 07/10/2015 revealed stable abnormal soft tissue along the celiac axis, SMA, and mesenteric root/jejunal mesentery, suggesting treated lymphoma.    At last visit, we discussed port-a-cath removal.  His port was removed in 11/2015.  During the interim, he has done well.  He denies any fevers, sweats or weight loss.  He has not been consistently taking his potassium chloride.   Past Medical History:  Diagnosis Date  . Asthma   . BPH (benign prostatic hyperplasia)   . Cancer [facility])    lymphom dx in 2011 in remission  . COPD (chronic obstructive pulmonary disease) ([place])   . Elevated PSA   . Erectile dysfunction   . Gout   . Hypertension   . Lymphoma ([place])   . Seizures ([place])     Past Surgical History:  Procedure Laterality Date  . COLONOSCOPY WITH PROPOFOL N/A 08/25/2014   Procedure: COLONOSCOPY WITH PROPOFOL;  Surgeon: Hulen Luster, MD;  Location: [facility] ENDOSCOPY;  Service: Gastroenterology;  Laterality: N/A;  . CYSTOSCOPY    . lymphectomy      Family History  Problem Relation Age of Onset  . Kidney disease Father 58  . Prostate cancer Neg Hx     Social History:  reports that he quit smoking about 6 years ago. He does not have any smokeless tobacco history on file. He reports that he drinks alcohol. His drug history is not on file.  The patient is alone  today.  Allergies:  Allergies  Allergen Reactions  . Finasteride     Current Medications: Current Outpatient Prescriptions  Medication Sig Dispense Refill  . ADVAIR DISKUS 250-50 MCG/DOSE AEPB Inhale 1 puff into the lungs 2 (two) times daily.     Marland Kitchen albuterol (PROVENTIL HFA;VENTOLIN HFA) 108 (90 Base) MCG/ACT inhaler Inhale into the lungs.    . lamoTRIgine (LAMICTAL) 25 MG tablet Take 50 mg by mouth 2 (two) times daily.     Marland Kitchen levETIRAcetam (KEPPRA) 750 MG tablet Take 750 mg by mouth 2 (two) times daily.     Marland Kitchen lisinopril (PRINIVIL,ZESTRIL) 5 MG tablet Take 5 mg by mouth daily.     . potassium chloride SA (K-DUR,KLOR-CON) 20 MEQ tablet Take 20 mEq by mouth daily.    . sildenafil (VIAGRA) 100 MG tablet Take 1 tablet (100 mg total) by mouth daily as needed for erectile dysfunction. 6 tablet 0   No current facility-administered medications for this visit.     Review of Systems:  GENERAL:  Feels good.  No fevers or sweats.  Weight up 3 pounds. PERFORMANCE STATUS (ECOG):  1 HEENT:  No visual changes, runny nose, sore throat, mouth sores or tenderness. Lungs:  Shortness of breath on exertion.  No cough.  No hemoptysis. Cardiac:  No chest pain, palpitations, orthopnea, or PND. GI:  Eating well.  Loose stools.  No nausea, diarrhea, vomiting,  constipation, melena or hematochezia. GU:  Enlarged prostate.  No urgency, frequency, dysuria, or hematuria. Musculoskeletal:  No back pain.  No joint pain.  No muscle tenderness. Extremities:  No pain or swelling. Skin:  No rashes or skin changes. Neuro:  No headache, numbness or weakness, balance or coordination issues. Endocrine:  No diabetes, thyroid issues, hot flashes or night sweats. Psych:  No mood changes, depression or anxiety. Pain:  No focal pain. Review of systems:  All other systems reviewed and found to be negative.  Physical Exam: Blood pressure (!) 160/65, pulse 66, temperature (!) 94.2 F (34.6 C), resp. rate 18, weight 154 lb 12.2  oz (70.2 kg). GENERAL:  Thin gentleman sitting comfortably in the exam room in no acute distress. MENTAL STATUS:  Alert and oriented to person, place and time. HEAD:  Short gray hair.  Graying goatee.  Normocephalic, atraumatic, face symmetric, no Cushingoid features. EYES:  Black rimmed glasses.  Brown eyes.  Pupils equal round and reactive to light and accomodation.  No conjunctivitis or scleral icterus. ENT:  Hoarse.  Oropharynx clear without lesion.  Dentures.  Tongue normal. Mucous membranes moist.  RESPIRATORY:  Clear to auscultation without rales, wheezes or rhonchi. CARDIOVASCULAR:  Regular rate and rhythm without murmur, rub or gallop. ABDOMEN:  Soft, non-tender, with active bowel sounds, and no hepatosplenomegaly.  No masses. SKIN:  No rashes, ulcers or lesions. EXTREMITIES: No edema, no skin discoloration or tenderness.  No palpable cords. LYMPH NODES: No palpable cervical, supraclavicular, axillary or inguinal adenopathy  NEUROLOGICAL: Unremarkable. PSYCH:  Appropriate.   Appointment on 01/14/2016  Component Date Value Ref Range Status  . WBC 01/14/2016 6.7  3.8 - 10.6 K/uL Final  . RBC 01/14/2016 4.28* 4.40 - 5.90 MIL/uL Final  . Hemoglobin 01/14/2016 13.6  13.0 - 18.0 g/dL Final  . HCT 01/14/2016 40.3  40.0 - 52.0 % Final  . MCV 01/14/2016 94.4  80.0 - 100.0 fL Final  . MCH 01/14/2016 31.9  26.0 - 34.0 pg Final  . MCHC 01/14/2016 33.8  32.0 - 36.0 g/dL Final  . RDW 01/14/2016 13.9  11.5 - 14.5 % Final  . Platelets 01/14/2016 177  150 - 440 K/uL Final  . Neutrophils Relative % 01/14/2016 69  % Final  . Neutro Abs 01/14/2016 4.6  1.4 - 6.5 K/uL Final  . Lymphocytes Relative 01/14/2016 20  % Final  . Lymphs Abs 01/14/2016 1.3  1.0 - 3.6 K/uL Final  . Monocytes Relative 01/14/2016 9  % Final  . Monocytes Absolute 01/14/2016 0.6  0.2 - 1.0 K/uL Final  . Eosinophils Relative 01/14/2016 2  % Final  . Eosinophils Absolute 01/14/2016 0.1  0 - 0.7 K/uL Final  . Basophils  Relative 01/14/2016 0  % Final  . Basophils Absolute 01/14/2016 0.0  0 - 0.1 K/uL Final  . Sodium 01/14/2016 140  135 - 145 mmol/L Final  . Potassium 01/14/2016 3.2* 3.5 - 5.1 mmol/L Final  . Chloride 01/14/2016 106  101 - 111 mmol/L Final  . CO2 01/14/2016 26  22 - 32 mmol/L Final  . Glucose, Bld 01/14/2016 99  65 - 99 mg/dL Final  . BUN 01/14/2016 11  6 - 20 mg/dL Final  . Creatinine, Ser 01/14/2016 1.51* 0.61 - 1.24 mg/dL Final  . Calcium 01/14/2016 8.6* 8.9 - 10.3 mg/dL Final  . Total Protein 01/14/2016 7.0  6.5 - 8.1 g/dL Final  . Albumin 01/14/2016 4.3  3.5 - 5.0 g/dL Final  . AST 01/14/2016 24  15 - 41 U/L Final  . ALT 01/14/2016 21  17 - 63 U/L Final  . Alkaline Phosphatase 01/14/2016 59  38 - 126 U/L Final  . Total Bilirubin 01/14/2016 1.1  0.3 - 1.2 mg/dL Final  . GFR calc non Af Amer 01/14/2016 43* >60 mL/min Final  . GFR calc Af Amer 01/14/2016 50* >60 mL/min Final   Comment: (NOTE) The eGFR has been calculated using the CKD EPI equation. This calculation has not been validated in all clinical situations. eGFR's persistently <60 mL/min signify possible Chronic Kidney Disease.   . Anion gap 01/14/2016 8  5 - 15 Final  . LDH 01/14/2016 148  98 - 192 U/L Final    Assessment:  Robert Weeks is a 75 y.o. 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  He presented with a right sided pleural effusion, retoperitoneal, and axillary adenopathy in 03/2009.  Pleural fluid on 03/30/2009 suggested the presence of a lymphoma of follicle center cell origin. Cytology was inconclusive.  Bone marrow biopsy on 04/04/2009 was negative.  Left axillary biopsy on 04/17/2009 were consistent with diffuse large B cell lymphoma and follicular lymphoma, grade 3.    He received 8 cycles of RCHOP from 04/24/2009- 09/26/2009.  He tolerated his chemotherapy well. He received maintainance Rituxan every 2 months beginning 12/12/09 until 03/19/2011 (9 of 12  planned treatments).    PET scan on 09/10/2012 revealed stable nodes.  CT scan on 07/10/2014 revealed mesenteric root and retroperitoneal stranding without overt nodularity characteristic of treated lymphoma and stable from prior exams. There was wall thickening at the anal rectal junction. 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  Chest, abdomen, and pelvic CT scan on 07/10/2015 revealed stable abnormal soft tissue along the celiac axis, SMA, and mesenteric root/jejunal mesentery, suggesting treated lymphoma.  There was no suspicious lymphadenopathy.  He has a 3.8 cm infrarenal abdominal aortic aneurysm.   He underwent colonoscopy on 08/25/2014. There was a medium polyp in the ascending colon (tubular adenoma).  There was diverticulosis  in the sigmoid and ascending colon.  Random colon biopsies were negative.  He has chronic intermittent diarrhea.  He denies any melena or hematochezia.  He has B12 deficiency.  B12 was 164 (low) on 01/01/2015.  Folate was 6.6 (> 5.9).  Follow-up B12 was 419 (normal) on 04/13/2015.  Symptomatically, he denies any B symptoms. Exam reveals no adenopathy or hepatosplenomegaly.  He has mild hypokalemia (3.2) after not taking his potassium supplement.  Plan: 1.  Labs today:  CBC with diff, CMP, LDH, uric acid, retic, B12. 2.  Discuss importance of taking potassium supplement. 3.  Chest, abdomen, and pelvic CT scan on 07/09/2016 4.  RTC after CT scan for MD assessment and labs (CBC with diff, CMP, LDH, uric acid).   Lequita Asal, MD  01/14/2016, 12:20 PM

## 2016-01-14 ENCOUNTER — Inpatient Hospital Stay (HOSPITAL_BASED_OUTPATIENT_CLINIC_OR_DEPARTMENT_OTHER): Payer: Medicare Other | Admitting: Hematology and Oncology

## 2016-01-14 ENCOUNTER — Encounter: Payer: Self-pay | Admitting: Hematology and Oncology

## 2016-01-14 ENCOUNTER — Inpatient Hospital Stay: Payer: Medicare Other | Attending: Hematology and Oncology

## 2016-01-14 VITALS — BP 160/65 | HR 66 | Temp 94.2°F | Resp 18 | Wt 154.8 lb

## 2016-01-14 DIAGNOSIS — Z79899 Other long term (current) drug therapy: Secondary | ICD-10-CM

## 2016-01-14 DIAGNOSIS — Z87891 Personal history of nicotine dependence: Secondary | ICD-10-CM | POA: Diagnosis not present

## 2016-01-14 DIAGNOSIS — E876 Hypokalemia: Secondary | ICD-10-CM | POA: Insufficient documentation

## 2016-01-14 DIAGNOSIS — M109 Gout, unspecified: Secondary | ICD-10-CM

## 2016-01-14 DIAGNOSIS — Z86010 Personal history of colonic polyps: Secondary | ICD-10-CM | POA: Insufficient documentation

## 2016-01-14 DIAGNOSIS — K5730 Diverticulosis of large intestine without perforation or abscess without bleeding: Secondary | ICD-10-CM | POA: Diagnosis not present

## 2016-01-14 DIAGNOSIS — R0609 Other forms of dyspnea: Secondary | ICD-10-CM | POA: Insufficient documentation

## 2016-01-14 DIAGNOSIS — I714 Abdominal aortic aneurysm, without rupture: Secondary | ICD-10-CM | POA: Insufficient documentation

## 2016-01-14 DIAGNOSIS — J449 Chronic obstructive pulmonary disease, unspecified: Secondary | ICD-10-CM | POA: Diagnosis not present

## 2016-01-14 DIAGNOSIS — C8228 Follicular lymphoma grade III, unspecified, lymph nodes of multiple sites: Secondary | ICD-10-CM | POA: Diagnosis not present

## 2016-01-14 DIAGNOSIS — E538 Deficiency of other specified B group vitamins: Secondary | ICD-10-CM

## 2016-01-14 DIAGNOSIS — I10 Essential (primary) hypertension: Secondary | ICD-10-CM

## 2016-01-14 DIAGNOSIS — N400 Enlarged prostate without lower urinary tract symptoms: Secondary | ICD-10-CM | POA: Diagnosis not present

## 2016-01-14 DIAGNOSIS — C8200 Follicular lymphoma grade I, unspecified site: Secondary | ICD-10-CM

## 2016-01-14 LAB — COMPREHENSIVE METABOLIC PANEL
ALT: 21 U/L (ref 17–63)
AST: 24 U/L (ref 15–41)
Albumin: 4.3 g/dL (ref 3.5–5.0)
Alkaline Phosphatase: 59 U/L (ref 38–126)
Anion gap: 8 (ref 5–15)
BUN: 11 mg/dL (ref 6–20)
CO2: 26 mmol/L (ref 22–32)
Calcium: 8.6 mg/dL — ABNORMAL LOW (ref 8.9–10.3)
Chloride: 106 mmol/L (ref 101–111)
Creatinine, Ser: 1.51 mg/dL — ABNORMAL HIGH (ref 0.61–1.24)
GFR calc Af Amer: 50 mL/min — ABNORMAL LOW (ref >60)
GFR calc non Af Amer: 43 mL/min — ABNORMAL LOW (ref >60)
Glucose, Bld: 99 mg/dL (ref 65–99)
Potassium: 3.2 mmol/L — ABNORMAL LOW (ref 3.5–5.1)
Sodium: 140 mmol/L (ref 135–145)
Total Bilirubin: 1.1 mg/dL (ref 0.3–1.2)
Total Protein: 7 g/dL (ref 6.5–8.1)

## 2016-01-14 LAB — CBC WITH DIFFERENTIAL/PLATELET
Basophils Absolute: 0 10*3/uL (ref 0–0.1)
Basophils Relative: 0 %
Eosinophils Absolute: 0.1 10*3/uL (ref 0–0.7)
Eosinophils Relative: 2 %
HCT: 40.3 % (ref 40.0–52.0)
Hemoglobin: 13.6 g/dL (ref 13.0–18.0)
Lymphocytes Relative: 20 %
Lymphs Abs: 1.3 10*3/uL (ref 1.0–3.6)
MCH: 31.9 pg (ref 26.0–34.0)
MCHC: 33.8 g/dL (ref 32.0–36.0)
MCV: 94.4 fL (ref 80.0–100.0)
Monocytes Absolute: 0.6 10*3/uL (ref 0.2–1.0)
Monocytes Relative: 9 %
Neutro Abs: 4.6 10*3/uL (ref 1.4–6.5)
Neutrophils Relative %: 69 %
Platelets: 177 10*3/uL (ref 150–440)
RBC: 4.28 MIL/uL — ABNORMAL LOW (ref 4.40–5.90)
RDW: 13.9 % (ref 11.5–14.5)
WBC: 6.7 10*3/uL (ref 3.8–10.6)

## 2016-01-14 LAB — RETICULOCYTES
RBC.: 4.27 MIL/uL — ABNORMAL LOW (ref 4.40–5.90)
Retic Count, Absolute: 51.2 10*3/uL (ref 19.0–183.0)
Retic Ct Pct: 1.2 % (ref 0.4–3.1)

## 2016-01-14 LAB — VITAMIN B12: Vitamin B-12: 438 pg/mL (ref 180–914)

## 2016-01-14 LAB — URIC ACID: Uric Acid, Serum: 6.3 mg/dL (ref 4.4–7.6)

## 2016-01-14 LAB — LACTATE DEHYDROGENASE: LDH: 148 U/L (ref 98–192)

## 2016-01-14 NOTE — Progress Notes (Signed)
BP elevated today. 184/105  HR 97.  Recheck 180/95 HR 65. Recheck 160/65 HR 66.  Patient remembered he did not take his Lisinopril today.

## 2016-01-23 ENCOUNTER — Encounter: Payer: Self-pay | Admitting: Urology

## 2016-01-23 ENCOUNTER — Ambulatory Visit: Payer: Medicare Other | Admitting: Urology

## 2016-01-23 VITALS — BP 137/76 | HR 75 | Ht 70.0 in | Wt 153.5 lb

## 2016-01-23 DIAGNOSIS — N138 Other obstructive and reflux uropathy: Secondary | ICD-10-CM | POA: Diagnosis not present

## 2016-01-23 DIAGNOSIS — N529 Male erectile dysfunction, unspecified: Secondary | ICD-10-CM

## 2016-01-23 DIAGNOSIS — N401 Enlarged prostate with lower urinary tract symptoms: Secondary | ICD-10-CM | POA: Diagnosis not present

## 2016-01-23 MED ORDER — SILDENAFIL CITRATE 20 MG PO TABS: ORAL_TABLET | ORAL | 3 refills | Status: DC

## 2016-01-23 NOTE — Progress Notes (Signed)
01/23/2016 1:45 PM   Robert Weeks [birth year] [id number]  Referring provider: Leonel Ramsay, MD [address], [place] [address]  Chief Complaint  Patient presents with  . Benign Prostatic Hypertrophy    1 year follow up  . Erectile Dysfunction    HPI: Patient is a 75 year old African American male with erectile dysfunction and BPH with LUTS who presents today for his 12 month follow up.  Erectile dysfunction His SHIM score is 14, which is mild to moderate ED.   His previous SHIM score was 15.  He has been having difficulty with erections for over three years.   His major complaint is maintaining and firmness of the erections.  His libido is preserved.   His risk factors for ED are HTN, BPH and age.  He denies any painful erections or curvatures with his erections.   He has tried PDE5-inhibitors in the past and they were effective.  He is not interested in treating his ED at this time due to his new onset of seizures.       SHIM    Row Name 01/23/16 1333         SHIM: Over the last 6 months:   How do you rate your confidence that you could get and keep an erection? Very Low     When you had erections with sexual stimulation, how often were your erections hard enough for penetration (entering your partner)? A Few Times (much less than half the time)     During sexual intercourse, how often were you able to maintain your erection after you had penetrated (entered) your partner? Difficult     During sexual intercourse, how difficult was it to maintain your erection to completion of intercourse? Slightly Difficult     When you attempted sexual intercourse, how often was it satisfactory for you? Slightly Difficult       SHIM Total Score   SHIM 14        Score: 1-7 Severe ED 8-11 Moderate ED 12-16 Mild-Moderate ED 17-21 Mild ED 22-25 No ED   BPH WITH LUTS His IPSS score today is 4, which is mild lower urinary  tract symptomatology. He is pleased with his quality life due to his urinary symptoms.  His previous IPSS score was 12/2.  His complaints today are intermittency, weak stream and nocturia x 2.  He denies any dysuria, hematuria or suprapubic pain.   He was on finasteride in the past, but he discontinued the medication due to dizzy spells.  He also denies any recent fevers, chills, nausea or vomiting.  He does not have a family history of PCa.      IPSS    Row Name 01/23/16 1300         International Prostate Symptom Score   How often have you had the sensation of not emptying your bladder? Not at All     How often have you had to urinate less than every two hours? Not at All     How often have you found you stopped and started again several times when you urinated? Less than 1 in 5 times     How often have you found it difficult to postpone urination? Not at All     How often have you had a weak urinary stream? Less than 1 in 5 times     How often have you had to strain to start urination? Not  at All     How many times did you typically get up at night to urinate? 2 Times     Total IPSS Score 4       Quality of Life due to urinary symptoms   If you were to spend the rest of your life with your urinary condition just the way it is now how would you feel about that? Pleased        Score:  1-7 Mild 8-19 Moderate 20-35 Severe   PMH: Past Medical History:  Diagnosis Date  . Asthma   . BPH (benign prostatic hyperplasia)   . Cancer [facility])    lymphom dx in 2011 in remission  . COPD (chronic obstructive pulmonary disease) ([place])   . Elevated PSA   . Erectile dysfunction   . Gout   . Hypertension   . Lymphoma ([place])   . Seizures [facility])     Surgical History: Past Surgical History:  Procedure Laterality Date  . COLONOSCOPY WITH PROPOFOL N/A 08/25/2014   Procedure: COLONOSCOPY WITH PROPOFOL;  Surgeon: Hulen Luster, MD;  Location: [facility] ENDOSCOPY;  Service: Gastroenterology;  Laterality:  N/A;  . CYSTOSCOPY    . lymphectomy      Home Medications:    Medication List       Accurate as of 01/23/16  1:45 PM. Always use your most recent med list.          ADVAIR DISKUS 250-50 MCG/DOSE Aepb Generic drug:  Fluticasone-Salmeterol Inhale 1 puff into the lungs 2 (two) times daily.   albuterol 108 (90 Base) MCG/ACT inhaler Commonly known as:  PROVENTIL HFA;VENTOLIN HFA Inhale into the lungs.   lamoTRIgine 25 MG tablet Commonly known as:  LAMICTAL Take 50 mg by mouth 2 (two) times daily.   levETIRAcetam 750 MG tablet Commonly known as:  KEPPRA Take 750 mg by mouth 2 (two) times daily.   lisinopril 5 MG tablet Commonly known as:  PRINIVIL,ZESTRIL Take 5 mg by mouth daily.   potassium chloride SA 20 MEQ tablet Commonly known as:  K-DUR,KLOR-CON Take 20 mEq by mouth daily.   sildenafil 100 MG tablet Commonly known as:  VIAGRA Take 1 tablet (100 mg total) by mouth daily as needed for erectile dysfunction.       Allergies:  Allergies  Allergen Reactions  . Finasteride     Family History: Family History  Problem Relation Age of Onset  . Kidney disease Father 4  . Prostate cancer Neg Hx   . Bladder Cancer Neg Hx     Social History:  reports that he quit smoking about 6 years ago. His smokeless tobacco use includes Snuff. He reports that he drinks alcohol. He reports that he does not use drugs.  ROS: UROLOGY Frequent Urination?: No Hard to postpone urination?: No Burning/pain with urination?: No Get up at night to urinate?: No Leakage of urine?: No Urine stream starts and stops?: No Trouble starting stream?: No Do you have to strain to urinate?: No Blood in urine?: No Urinary tract infection?: No Sexually transmitted disease?: No Injury to kidneys or bladder?: No Painful intercourse?: No Weak stream?: No Erection problems?: Yes Penile pain?: No  Gastrointestinal Nausea?: No Vomiting?: No Indigestion/heartburn?: No Diarrhea?:  No Constipation?: No  Constitutional Fever: No Night sweats?: No Weight loss?: No Fatigue?: No  Skin Skin rash/lesions?: No Itching?: No  Eyes Blurred vision?: No Double vision?: No  Ears/Nose/Throat Sore throat?: No Sinus problems?: No  Hematologic/Lymphatic Swollen glands?: No Easy bruising?: No  Cardiovascular Leg swelling?: No Chest pain?: No  Respiratory Cough?: No Shortness of breath?: Yes  Endocrine Excessive thirst?: No  Musculoskeletal Back pain?: No Joint pain?: No  Neurological Headaches?: No Dizziness?: No  Psychologic Depression?: No Anxiety?: No  Physical Exam: BP 137/76   Pulse 75   Ht 5\' 10"  (1.778 m)   Wt 153 lb 8 oz (69.6 kg)   BMI 22.02 kg/m   GU: No CVA tenderness.  No bladder fullness or masses.  Patient with circumcised phallus.   Urethral meatus is patent.  No penile discharge. No penile lesions or rashes. Scrotum without lesions, cysts, rashes and/or edema.  Testicles are located scrotally bilaterally. No masses are appreciated in the testicles. Left and right epididymis are normal. Rectal: Patient with  normal sphincter tone. Anus and perineum without scarring or rashes. No rectal masses are appreciated. Prostate is approximately 60 grams, no nodules are appreciated. Seminal vesicles are normal.    Laboratory Data: Lab Results  Component Value Date   WBC 6.7 01/14/2016   HGB 13.6 01/14/2016   HCT 40.3 01/14/2016   MCV 94.4 01/14/2016   PLT 177 01/14/2016    Lab Results  Component Value Date   CREATININE 1.51 (H) 01/14/2016   PSA History  4.0 ng/mL on 04/06/2013  3.8 ng/mL on 10/12/2013  1.0 ng/mL on 04/14/2014 Lab Results  Component Value Date   PSA 3.1 06/25/2012   PSA 5.0 (H) 05/26/2011    Lab Results  Component Value Date   HGBA1C 5.4 11/12/2011     Assessment & Plan:    1. BPH with LUTS:   IPSS score is 4/1, it is improving.  We will continue to monitor.  He will return to clinic in one year for  IPSS score and exam.    2. Erectile dysfunction:   SHIM score is 14.  He would like a script for sildenafil sent to [facility].   He will return to clinic in one year for SHIM score and exam.      Return in about 1 year (around 01/22/2017) for IPSS and exam.  Zara Council, [facility]  [facility] [address], [place], [place] [address] [phone]

## 2016-02-20 ENCOUNTER — Encounter: Payer: Self-pay | Admitting: Hematology and Oncology

## 2016-07-09 ENCOUNTER — Ambulatory Visit
Admission: RE | Admit: 2016-07-09 | Discharge: 2016-07-09 | Disposition: A | Discharge status: Home / Self Care | Payer: Medicare Other | Source: Ambulatory Visit | Attending: Hematology and Oncology | Admitting: Hematology and Oncology

## 2016-07-09 DIAGNOSIS — I77811 Abdominal aortic ectasia: Secondary | ICD-10-CM | POA: Insufficient documentation

## 2016-07-09 DIAGNOSIS — I2510 Atherosclerotic heart disease of native coronary artery without angina pectoris: Secondary | ICD-10-CM | POA: Diagnosis not present

## 2016-07-09 DIAGNOSIS — C8228 Follicular lymphoma grade III, unspecified, lymph nodes of multiple sites: Secondary | ICD-10-CM | POA: Diagnosis not present

## 2016-07-09 DIAGNOSIS — I7789 Other specified disorders of arteries and arterioles: Secondary | ICD-10-CM | POA: Insufficient documentation

## 2016-07-09 DIAGNOSIS — J479 Bronchiectasis, uncomplicated: Secondary | ICD-10-CM | POA: Diagnosis not present

## 2016-07-09 DIAGNOSIS — I700 Atherosclerosis of aorta: Secondary | ICD-10-CM | POA: Insufficient documentation

## 2016-07-11 ENCOUNTER — Ambulatory Visit: Payer: Medicare Other | Admitting: Hematology and Oncology

## 2016-07-11 ENCOUNTER — Other Ambulatory Visit: Payer: Medicare Other

## 2016-07-15 ENCOUNTER — Encounter: Payer: Self-pay | Admitting: Hematology and Oncology

## 2016-07-15 ENCOUNTER — Inpatient Hospital Stay: Payer: Medicare Other | Attending: Hematology and Oncology

## 2016-07-15 ENCOUNTER — Inpatient Hospital Stay (HOSPITAL_BASED_OUTPATIENT_CLINIC_OR_DEPARTMENT_OTHER): Payer: Medicare Other | Admitting: Hematology and Oncology

## 2016-07-15 VITALS — BP 132/80 | HR 76 | Temp 96.4°F | Wt 154.1 lb

## 2016-07-15 DIAGNOSIS — I2510 Atherosclerotic heart disease of native coronary artery without angina pectoris: Secondary | ICD-10-CM

## 2016-07-15 DIAGNOSIS — J449 Chronic obstructive pulmonary disease, unspecified: Secondary | ICD-10-CM | POA: Insufficient documentation

## 2016-07-15 DIAGNOSIS — Z79899 Other long term (current) drug therapy: Secondary | ICD-10-CM

## 2016-07-15 DIAGNOSIS — C8228 Follicular lymphoma grade III, unspecified, lymph nodes of multiple sites: Secondary | ICD-10-CM

## 2016-07-15 DIAGNOSIS — R0600 Dyspnea, unspecified: Secondary | ICD-10-CM | POA: Insufficient documentation

## 2016-07-15 DIAGNOSIS — I10 Essential (primary) hypertension: Secondary | ICD-10-CM | POA: Diagnosis not present

## 2016-07-15 DIAGNOSIS — Z87891 Personal history of nicotine dependence: Secondary | ICD-10-CM

## 2016-07-15 DIAGNOSIS — R490 Dysphonia: Secondary | ICD-10-CM | POA: Insufficient documentation

## 2016-07-15 DIAGNOSIS — I700 Atherosclerosis of aorta: Secondary | ICD-10-CM | POA: Diagnosis not present

## 2016-07-15 DIAGNOSIS — I714 Abdominal aortic aneurysm, without rupture: Secondary | ICD-10-CM | POA: Diagnosis not present

## 2016-07-15 DIAGNOSIS — N400 Enlarged prostate without lower urinary tract symptoms: Secondary | ICD-10-CM

## 2016-07-15 DIAGNOSIS — E538 Deficiency of other specified B group vitamins: Secondary | ICD-10-CM | POA: Diagnosis not present

## 2016-07-15 DIAGNOSIS — Z8051 Family history of malignant neoplasm of kidney: Secondary | ICD-10-CM | POA: Insufficient documentation

## 2016-07-15 DIAGNOSIS — M109 Gout, unspecified: Secondary | ICD-10-CM

## 2016-07-15 LAB — COMPREHENSIVE METABOLIC PANEL
ALT: 35 U/L (ref 17–63)
AST: 35 U/L (ref 15–41)
Albumin: 4 g/dL (ref 3.5–5.0)
Alkaline Phosphatase: 80 U/L (ref 38–126)
Anion gap: 4 — ABNORMAL LOW (ref 5–15)
BUN: 18 mg/dL (ref 6–20)
CO2: 26 mmol/L (ref 22–32)
Calcium: 9 mg/dL (ref 8.9–10.3)
Chloride: 111 mmol/L (ref 101–111)
Creatinine, Ser: 1.62 mg/dL — ABNORMAL HIGH (ref 0.61–1.24)
GFR calc Af Amer: 46 mL/min — ABNORMAL LOW (ref >60)
GFR calc non Af Amer: 39 mL/min — ABNORMAL LOW (ref >60)
Glucose, Bld: 102 mg/dL — ABNORMAL HIGH (ref 65–99)
Potassium: 4 mmol/L (ref 3.5–5.1)
Sodium: 141 mmol/L (ref 135–145)
Total Bilirubin: 0.8 mg/dL (ref 0.3–1.2)
Total Protein: 7 g/dL (ref 6.5–8.1)

## 2016-07-15 LAB — URIC ACID: Uric Acid, Serum: 6.4 mg/dL (ref 4.4–7.6)

## 2016-07-15 LAB — CBC WITH DIFFERENTIAL/PLATELET
Basophils Absolute: 0 10*3/uL (ref 0–0.1)
Basophils Relative: 1 %
Eosinophils Absolute: 0.4 10*3/uL (ref 0–0.7)
Eosinophils Relative: 5 %
HCT: 39.1 % — ABNORMAL LOW (ref 40.0–52.0)
Hemoglobin: 13.5 g/dL (ref 13.0–18.0)
Lymphocytes Relative: 22 %
Lymphs Abs: 1.6 10*3/uL (ref 1.0–3.6)
MCH: 31.5 pg (ref 26.0–34.0)
MCHC: 34.5 g/dL (ref 32.0–36.0)
MCV: 91.5 fL (ref 80.0–100.0)
Monocytes Absolute: 0.7 10*3/uL (ref 0.2–1.0)
Monocytes Relative: 9 %
Neutro Abs: 4.6 10*3/uL (ref 1.4–6.5)
Neutrophils Relative %: 63 %
Platelets: 188 10*3/uL (ref 150–440)
RBC: 4.27 MIL/uL — ABNORMAL LOW (ref 4.40–5.90)
RDW: 13.7 % (ref 11.5–14.5)
WBC: 7.3 10*3/uL (ref 3.8–10.6)

## 2016-07-15 LAB — LACTATE DEHYDROGENASE: LDH: 139 U/L (ref 98–192)

## 2016-07-15 NOTE — Progress Notes (Signed)
Patient offers no concerns today. 

## 2016-07-15 NOTE — Progress Notes (Signed)
[facility] day:  07/15/16   Chief Complaint: Robert Weeks is a 76 y.o. 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  HPI:   The patient was last seen in the medical oncology clinic on 01/14/2016.  At that time, he denied any B symptoms. Exam revealed no adenopathy or hepatosplenomegaly.  He had mild hypokalemia (3.2) after not taking his potassium supplement.  Creatinine was 1.51.  LDH and uric acid were normal.  Chest, abdomen, and pelvic CT scan on 07/09/2016 revealed evidence of treated lymphoma in the chest and abdomen, similar to prior examinations.  There were no findings to suggest recurrent active disease.  There was aortic atherosclerosis, in addition to left main and 3 vessel coronary artery disease.  There was aneurysmal dilatation of the infrarenal abdominal aorta (3.7 cm in diameter) as well as the common iliac arteries bilaterally (2.6 cm on the right and 2.3 cm on the left), similar to prior examinations. Recommendation was for followup by ultrasound in 2 years. There were scattered areas of mild cylindrical bronchiectasis in the lung bases.  During the interim, he denies any B symptoms.  He denies any adenopathy, bruising, bleeding or interval infections.  He states that he saw ENT.  There was no cancer causing hoarseness. He states that the Advair was affecting his voice.   Past Medical History:  Diagnosis Date  . Asthma   . BPH (benign prostatic hyperplasia)   . Cancer [facility])    lymphom dx in 2011 in remission  . COPD (chronic obstructive pulmonary disease) ([place])   . Elevated PSA   . Erectile dysfunction   . Gout   . Hypertension   . Lymphoma ([place])   . Seizures ([place])     Past Surgical History:  Procedure Laterality Date  . COLONOSCOPY WITH PROPOFOL N/A 08/25/2014   Procedure: COLONOSCOPY WITH PROPOFOL;  Surgeon: Hulen Luster, MD;  Location: [facility] ENDOSCOPY;   Service: Gastroenterology;  Laterality: N/A;  . CYSTOSCOPY    . lymphectomy      Family History  Problem Relation Age of Onset  . Kidney disease Father 95  . Prostate cancer Neg Hx   . Bladder Cancer Neg Hx     Social History:  reports that he quit smoking about 7 years ago. His smokeless tobacco use includes Snuff. He reports that he drinks alcohol. He reports that he does not use drugs.  The patient is alone today.  Allergies:  Allergies  Allergen Reactions  . Finasteride     Current Medications: Current Outpatient Prescriptions  Medication Sig Dispense Refill  . ADVAIR DISKUS 250-50 MCG/DOSE AEPB Inhale 1 puff into the lungs 2 (two) times daily.     Marland Kitchen albuterol (PROVENTIL HFA;VENTOLIN HFA) 108 (90 Base) MCG/ACT inhaler Inhale into the lungs.    . lamoTRIgine (LAMICTAL) 25 MG tablet Take 50 mg by mouth 2 (two) times daily.     Marland Kitchen levETIRAcetam (KEPPRA) 750 MG tablet Take 750 mg by mouth 2 (two) times daily.     Marland Kitchen lisinopril (PRINIVIL,ZESTRIL) 5 MG tablet Take 5 mg by mouth daily.     . potassium chloride SA (K-DUR,KLOR-CON) 20 MEQ tablet Take 20 mEq by mouth daily.    . sildenafil (REVATIO) 20 MG tablet Take 3 to 5 tablets two hours before intercouse on an empty stomach.  Do not take with nitrates. 50 tablet 3   No current  facility-administered medications for this visit.     Review of Systems:  GENERAL:  Feels good.  No fevers or sweats.  Weight stable. PERFORMANCE STATUS (ECOG):  1 HEENT:  Hoarse (see HPI).  No visual changes, runny nose, sore throat, mouth sores or tenderness. Lungs:  Shortness of breath on exertion.  No cough.  No hemoptysis. Cardiac:  No chest pain, palpitations, orthopnea, or PND. GI:  Eating well.  No nausea, diarrhea, vomiting, constipation, melena or hematochezia. GU:  Enlarged prostate.  No urgency, frequency, dysuria, or hematuria. Musculoskeletal:  No back pain.  No joint pain.  No muscle tenderness. Extremities:  No pain or swelling. Skin:   No rashes or skin changes. Neuro:  No headache, numbness or weakness, balance or coordination issues. Endocrine:  No diabetes, thyroid issues, hot flashes or night sweats. Psych:  No mood changes, depression or anxiety. Pain:  No focal pain. Review of systems:  All other systems reviewed and found to be negative.  Physical Exam: Blood pressure 132/80, pulse 76, temperature (!) 96.4 F (35.8 C), temperature source Tympanic, weight 154 lb 1 oz (69.9 kg). GENERAL:  Thin gentleman sitting comfortably in the exam room in no acute distress. MENTAL STATUS:  Alert and oriented to person, place and time. HEAD:  Short gray hair.  Graying goatee.  Normocephalic, atraumatic, face symmetric, no Cushingoid features. EYES:  Black rimmed glasses.  Brown eyes.  Pupils equal round and reactive to light and accomodation.  No conjunctivitis or scleral icterus. ENT:  Hoarse.  Oropharynx clear without lesion.  Upper dentures.  Tongue normal. Mucous membranes moist.  RESPIRATORY:  Clear to auscultation without rales, wheezes or rhonchi. CARDIOVASCULAR:  Regular rate and rhythm without murmur, rub or gallop. ABDOMEN:  Soft, non-tender, with active bowel sounds, and no hepatosplenomegaly.  No masses. SKIN:  No rashes, ulcers or lesions. EXTREMITIES: No edema, no skin discoloration or tenderness.  No palpable cords. LYMPH NODES: No palpable cervical, supraclavicular, axillary or inguinal adenopathy  NEUROLOGICAL: Unremarkable. PSYCH:  Appropriate.   Appointment on 07/15/2016  Component Date Value Ref Range Status  . WBC 07/15/2016 7.3  3.8 - 10.6 K/uL Final  . RBC 07/15/2016 4.27* 4.40 - 5.90 MIL/uL Final  . Hemoglobin 07/15/2016 13.5  13.0 - 18.0 g/dL Final  . HCT 07/15/2016 39.1* 40.0 - 52.0 % Final  . MCV 07/15/2016 91.5  80.0 - 100.0 fL Final  . MCH 07/15/2016 31.5  26.0 - 34.0 pg Final  . MCHC 07/15/2016 34.5  32.0 - 36.0 g/dL Final  . RDW 07/15/2016 13.7  11.5 - 14.5 % Final  . Platelets 07/15/2016  188  150 - 440 K/uL Final  . Neutrophils Relative % 07/15/2016 63  % Final  . Neutro Abs 07/15/2016 4.6  1.4 - 6.5 K/uL Final  . Lymphocytes Relative 07/15/2016 22  % Final  . Lymphs Abs 07/15/2016 1.6  1.0 - 3.6 K/uL Final  . Monocytes Relative 07/15/2016 9  % Final  . Monocytes Absolute 07/15/2016 0.7  0.2 - 1.0 K/uL Final  . Eosinophils Relative 07/15/2016 5  % Final  . Eosinophils Absolute 07/15/2016 0.4  0 - 0.7 K/uL Final  . Basophils Relative 07/15/2016 1  % Final  . Basophils Absolute 07/15/2016 0.0  0 - 0.1 K/uL Final    Assessment:  Robert Weeks is a 76 y.o. 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  He presented with a right sided pleural effusion, retoperitoneal, and axillary adenopathy in  03/2009.  Pleural fluid on 03/30/2009 suggested the presence of a lymphoma of follicle center cell origin. Cytology was inconclusive.  Bone marrow biopsy on 04/04/2009 was negative.  Left axillary biopsy on 04/17/2009 were consistent with diffuse large B cell lymphoma and follicular lymphoma, grade 3.    He received 8 cycles of RCHOP from 04/24/2009- 09/26/2009.  He tolerated his chemotherapy well. He received maintainance Rituxan every 2 months beginning 12/12/09 until 03/19/2011 (9 of 12 planned treatments).    PET scan on 09/10/2012 revealed stable nodes.  CT scan on 07/10/2014 revealed mesenteric root and retroperitoneal stranding without overt nodularity characteristic of treated lymphoma and stable from prior exams. There was wall thickening at the anal rectal junction. 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  Chest, abdomen, and pelvic CT on 07/10/2015 revealed stable abnormal soft tissue along the celiac axis, SMA, and mesenteric root/jejunal mesentery, suggesting treated lymphoma.  There was no suspicious lymphadenopathy.  He has a 3.8 cm infrarenal abdominal aortic aneurysm.   Chest, abdomen, and pelvic CT on  07/09/2016 revealed evidence of treated lymphoma in the chest and abdomen.  There were no findings to suggest recurrent disease.   He underwent colonoscopy on 08/25/2014. There was a medium polyp in the ascending colon (tubular adenoma).  There was diverticulosis  in the sigmoid and ascending colon.  Random colon biopsies were negative.  He has chronic intermittent diarrhea.  He denies any melena or hematochezia.  He has B12 deficiency.  B12 was 164 (low) on 01/01/2015.  Folate was 6.6 (> 5.9).  Follow-up B12 was 438 (normal) on 01/14/2016.  Symptomatically, he denies any B symptoms. Exam reveals no adenopathy or hepatosplenomegaly.  Potassium is normal.  Plan: 1.  Labs today:  CBC with diff, CMP, LDH, uric acid. 2.  Review chest, abdomen, and pelvic CT- no evidence of disease. 3.  RTC in 6 months for MD assessment and labs (CBC with diff, CMP, LDH, uric acid).   Lequita Asal, MD  07/15/2016, 10:12 AM

## 2016-10-28 ENCOUNTER — Other Ambulatory Visit (INDEPENDENT_AMBULATORY_CARE_PROVIDER_SITE_OTHER): Payer: Self-pay | Admitting: Vascular Surgery

## 2016-10-28 DIAGNOSIS — I714 Abdominal aortic aneurysm, without rupture, unspecified: Secondary | ICD-10-CM

## 2016-10-31 ENCOUNTER — Ambulatory Visit (INDEPENDENT_AMBULATORY_CARE_PROVIDER_SITE_OTHER): Payer: Medicare Other

## 2016-10-31 ENCOUNTER — Encounter (INDEPENDENT_AMBULATORY_CARE_PROVIDER_SITE_OTHER): Payer: Self-pay | Admitting: Vascular Surgery

## 2016-10-31 ENCOUNTER — Ambulatory Visit (INDEPENDENT_AMBULATORY_CARE_PROVIDER_SITE_OTHER): Payer: Medicare Other | Admitting: Vascular Surgery

## 2016-10-31 VITALS — BP 126/75 | HR 62 | Resp 14 | Ht 69.5 in | Wt 158.0 lb

## 2016-10-31 DIAGNOSIS — I714 Abdominal aortic aneurysm, without rupture, unspecified: Secondary | ICD-10-CM

## 2016-10-31 DIAGNOSIS — I10 Essential (primary) hypertension: Secondary | ICD-10-CM | POA: Diagnosis not present

## 2016-10-31 NOTE — Assessment & Plan Note (Signed)
blood pressure control important in reducing the progression of atherosclerotic disease and aneurysmal disease. On appropriate oral medications.  

## 2016-10-31 NOTE — Patient Instructions (Signed)
Abdominal Aortic Aneurysm Blood pumps away from the heart through tubes (blood vessels) called arteries. Aneurysms are weak or damaged places in the wall of an artery. It bulges out like a balloon. An abdominal aortic aneurysm happens in the main artery of the body (aorta). It can burst or tear, causing bleeding inside the body. This is an emergency. It needs treatment right away. What are the causes? The exact cause is unknown. Things that could cause this problem include:  Fat and other substances building up in the lining of a tube.  Swelling of the walls of a blood vessel.  Certain tissue diseases.  Belly (abdominal) trauma.  An infection in the main artery of the body.  What increases the risk? There are things that make it more likely for you to have an aneurysm. These include:  Being over the age of 78 years old.  Having high blood pressure (hypertension).  Being a male.  Being white.  Being very overweight (obese).  Having a family history of aneurysm.  Using tobacco products.  What are the signs or symptoms? Symptoms depend on the size of the aneurysm and how fast it grows. There may not be symptoms. If symptoms occur, they can include:  Pain (belly, side, lower back, or groin).  Feeling full after eating a small amount of food.  Feeling sick to your stomach (nauseous), throwing up (vomiting), or both.  Feeling a lump in your belly that feels like it is beating (pulsating).  Feeling like you will pass out (faint).  How is this treated?  Medicine to control blood pressure and pain.  Imaging tests to see if the aneurysm gets bigger.  Surgery. How is this prevented? To lessen your chance of getting this condition:  Stop smoking. Stop chewing tobacco.  Limit or avoid alcohol.  Keep your blood pressure, blood sugar, and cholesterol within normal limits.  Eat less salt.  Eat foods low in saturated fats and cholesterol. These are found in animal and  whole dairy products.  Eat more fiber. Fiber is found in whole grains, vegetables, and fruits.  Keep a healthy weight.  Stay active and exercise often.  This information is not intended to replace advice given to you by your health care provider. Make sure you discuss any questions you have with your health care provider. Document Released: 06/07/2012 Document Revised: 07/19/2015 Document Reviewed: 03/12/2012 Elsevier Interactive Patient Education  2017 [employer] Inc.  

## 2016-10-31 NOTE — Progress Notes (Signed)
MRN : [id number]  Robert Weeks is a 76 y.o. ([birth year]) male who presents with chief complaint of  Chief Complaint  Patient presents with  . Follow-up    1 year aortic/iliac  .  History of Present Illness: Patient returns today in follow up of Abdominal aortic and iliac artery aneurysms. He is doing well. He denies any aneurysm related symptoms. Specifically, the patient denies new back or abdominal pain, or signs of peripheral embolization. His duplex today shows a stable 3.7 cm abdominal aortic aneurysm with approximately 2 cm iliac artery aneurysms. This is stable from previous noninvasive studies 2 years ago and his CT scan earlier this year.  Current Outpatient Prescriptions  Medication Sig Dispense Refill  . ADVAIR DISKUS 250-50 MCG/DOSE AEPB Inhale 1 puff into the lungs 2 (two) times daily.     Marland Kitchen albuterol (PROVENTIL HFA;VENTOLIN HFA) 108 (90 Base) MCG/ACT inhaler Inhale into the lungs.    . fluticasone-salmeterol (ADVAIR HFA) 115-21 MCG/ACT inhaler Inhale into the lungs.    . lamoTRIgine (LAMICTAL) 25 MG tablet Take 50 mg by mouth 2 (two) times daily.     Marland Kitchen levETIRAcetam (KEPPRA) 750 MG tablet Take 750 mg by mouth 2 (two) times daily.     Marland Kitchen lisinopril (PRINIVIL,ZESTRIL) 5 MG tablet Take 5 mg by mouth daily.     . potassium chloride SA (K-DUR,KLOR-CON) 20 MEQ tablet Take 20 mEq by mouth daily.    . sildenafil (REVATIO) 20 MG tablet Take 3 to 5 tablets two hours before intercouse on an empty stomach.  Do not take with nitrates. 50 tablet 3   No current facility-administered medications for this visit.     Past Medical History:  Diagnosis Date  . Asthma   . BPH (benign prostatic hyperplasia)   . Cancer [facility])    lymphom dx in 2011 in remission  . COPD (chronic obstructive pulmonary disease) ([place])   . Elevated PSA   . Erectile dysfunction   . Gout   . Hypertension   . Lymphoma ([place])   . Seizures ([place])     Past Surgical History:  Procedure Laterality Date  .  COLONOSCOPY WITH PROPOFOL N/A 08/25/2014   Procedure: COLONOSCOPY WITH PROPOFOL;  Surgeon: Hulen Luster, MD;  Location: [facility] ENDOSCOPY;  Service: Gastroenterology;  Laterality: N/A;  . CYSTOSCOPY    . lymphectomy      Social History Social History  Substance Use Topics  . Smoking status: Former Smoker    Quit date: 05/01/2009  . Smokeless tobacco: Current User    Types: Snuff  . Alcohol use 0.0 oz/week      Family History Family History  Problem Relation Age of Onset  . Kidney disease Father 55  . Prostate cancer Neg Hx   . Bladder Cancer Neg Hx      Allergies  Allergen Reactions  . Finasteride      REVIEW OF SYSTEMS (Negative unless checked)  Constitutional: [] Weight loss  [] Fever  [] Chills Cardiac: [] Chest pain   [] Chest pressure   [] Palpitations   [] Shortness of breath when laying flat   [] Shortness of breath at rest   [] Shortness of breath with exertion. Vascular:  [] Pain in legs with walking   [] Pain in legs at rest   [] Pain in legs when laying flat   [] Claudication   [] Pain in feet when walking  [] Pain in feet at rest  [] Pain in feet when laying flat   [] History of DVT   [] Phlebitis   [] Swelling in legs   []   Varicose veins   [] Non-healing ulcers Pulmonary:   [] Uses home oxygen   [] Productive cough   [] Hemoptysis   [] Wheeze  [] COPD   [] Asthma Neurologic:  [] Dizziness  [] Blackouts   [] Seizures   [] History of stroke   [] History of TIA  [] Aphasia   [] Temporary blindness   [] Dysphagia   [] Weakness or numbness in arms   [] Weakness or numbness in legs Musculoskeletal:  [x] Arthritis   [] Joint swelling   [] Joint pain   [] Low back pain Hematologic:  [] Easy bruising  [] Easy bleeding   [] Hypercoagulable state   [] Anemic   Gastrointestinal:  [] Blood in stool   [] Vomiting blood  [] Gastroesophageal reflux/heartburn   [] Abdominal pain Genitourinary:  [] Chronic kidney disease   [x] Difficult urination  [x] Frequent urination  [] Burning with urination   [] Hematuria Skin:  [] Rashes   [] Ulcers    [] Wounds Psychological:  [] History of anxiety   []  History of major depression.  Physical Examination  BP 126/75 (BP Location: Right Arm)   Pulse 62   Resp 14   Ht 5' 9.5" (1.765 m)   Wt 71.7 kg (158 lb)   BMI 23.00 kg/m  Gen:  WD/WN, NAD Head: [place]/AT, No temporalis wasting. Ear/Nose/Throat: Hearing grossly intact, nares w/o erythema or drainage, trachea midline Eyes: Conjunctiva clear. Sclera non-icteric Neck: Supple.  No JVD.  Pulmonary:  Good air movement, no use of accessory muscles.  Cardiac: RRR, normal S1, S2 Vascular:  Vessel Right Left  Radial Palpable Palpable  Ulnar Palpable Palpable  Brachial Palpable Palpable  Carotid Palpable, without bruit Palpable, without bruit  Aorta Not palpable N/A  Femoral Palpable Palpable  Popliteal Palpable Palpable  PT Palpable Palpable  DP Palpable Palpable   Gastrointestinal: soft, non-tender/non-distended. Aorta mildly enlarged Musculoskeletal: M/S 5/5 throughout.  No deformity or atrophy. No edema. Neurologic: Sensation grossly intact in extremities.  Symmetrical.  Speech is fluent.  Psychiatric: Judgment intact, Mood & affect appropriate for pt's clinical situation. Dermatologic: No rashes or ulcers noted.  No cellulitis or open wounds. Lymph : No Cervical, Axillary, or Inguinal lymphadenopathy.      Labs No results found for this or any previous visit (from the past 2160 hour(s)).  Radiology No results found.    Assessment/Plan  Benign essential HTN blood pressure control important in reducing the progression of atherosclerotic disease and aneurysmal disease. On appropriate oral medications.   AAA (abdominal aortic aneurysm) without rupture (HCC) His duplex today shows a stable 3.7 cm abdominal aortic aneurysm with approximately 2 cm iliac artery aneurysms. This is stable from previous noninvasive studies 2 years ago and his CT scan earlier this year. We discussed the importance of blood pressure control and  tobacco abstinence. Recheck in 1 year    Leotis Pain, MD  10/31/2016 9:21 AM    This note was created with Dragon medical transcription system.  Any errors from dictation are purely unintentional

## 2016-10-31 NOTE — Assessment & Plan Note (Signed)
His duplex today shows a stable 3.7 cm abdominal aortic aneurysm with approximately 2 cm iliac artery aneurysms. This is stable from previous noninvasive studies 2 years ago and his CT scan earlier this year. We discussed the importance of blood pressure control and tobacco abstinence. Recheck in 1 year

## 2017-01-17 ENCOUNTER — Other Ambulatory Visit: Payer: Self-pay | Admitting: Hematology and Oncology

## 2017-01-17 DIAGNOSIS — E538 Deficiency of other specified B group vitamins: Secondary | ICD-10-CM

## 2017-01-17 NOTE — Progress Notes (Signed)
[facility] day:  01/19/17   Chief Complaint: Robert Weeks is a 76 y.o. 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  HPI:   The patient was last seen in the medical oncology clinic on 07/15/2016.  At that time, he denied any B symptoms. Exam revealed no adenopathy or hepatosplenomegaly.  Chest, abdomen, and pelvic CT from 07/09/2016 revealed no evidence of recurrent disease.  During the interim, patient has been doing well. He denies physical complaints today. Patient denies lumps, bumps, bruising, and bleeding. He complains of cold hands. Patient has not experienced any B symptoms or interval infections. Despite a good appetite, patient has lost 3 pounds. Patient denies pain in the clinic today.    Past Medical History:  Diagnosis Date  . Asthma   . BPH (benign prostatic hyperplasia)   . Cancer [facility])    lymphom dx in 2011 in remission  . COPD (chronic obstructive pulmonary disease) ([employer])   . Elevated PSA   . Erectile dysfunction   . Gout   . Hypertension   . Lymphoma ([place])   . Seizures ([place])     Past Surgical History:  Procedure Laterality Date  . COLONOSCOPY WITH PROPOFOL N/A 08/25/2014   Procedure: COLONOSCOPY WITH PROPOFOL;  Surgeon: Hulen Luster, MD;  Location: [facility] ENDOSCOPY;  Service: Gastroenterology;  Laterality: N/A;  . CYSTOSCOPY    . lymphectomy      Family History  Problem Relation Age of Onset  . Kidney disease Father 86  . Prostate cancer Neg Hx   . Bladder Cancer Neg Hx     Social History:  reports that he quit smoking about 7 years ago. His smokeless tobacco use includes snuff. He reports that he drinks alcohol. He reports that he does not use drugs.  The patient is alone today.  Allergies:  Allergies  Allergen Reactions  . Finasteride     Current Medications: Current Outpatient Medications  Medication Sig Dispense Refill  . ADVAIR DISKUS  250-50 MCG/DOSE AEPB Inhale 1 puff into the lungs 2 (two) times daily.     Marland Kitchen albuterol (PROVENTIL HFA;VENTOLIN HFA) 108 (90 Base) MCG/ACT inhaler Inhale into the lungs.    . fluticasone-salmeterol (ADVAIR HFA) 115-21 MCG/ACT inhaler Inhale into the lungs.    . lamoTRIgine (LAMICTAL) 25 MG tablet Take 50 mg by mouth 2 (two) times daily.     Marland Kitchen levETIRAcetam (KEPPRA) 750 MG tablet Take 750 mg by mouth 2 (two) times daily.     Marland Kitchen lisinopril (PRINIVIL,ZESTRIL) 5 MG tablet Take 5 mg by mouth daily.     . potassium chloride SA (K-DUR,KLOR-CON) 20 MEQ tablet Take 20 mEq by mouth daily.    . sildenafil (REVATIO) 20 MG tablet Take 3 to 5 tablets two hours before intercouse on an empty stomach.  Do not take with nitrates. (Patient not taking: Reported on 01/19/2017) 50 tablet 3   No current facility-administered medications for this visit.     Review of Systems:  GENERAL:  Feels "well".  No fevers or sweats.  Weight down 3 pounds. PERFORMANCE STATUS (ECOG):  1 HEENT:  Hoarse.  No visual changes, runny nose, sore throat, mouth sores or tenderness. Lungs:  Shortness of breath on exertion.  No cough.  No hemoptysis. Cardiac:  No chest pain, palpitations, orthopnea, or PND. GI:  Eating well.  No nausea, diarrhea, vomiting, constipation, melena or hematochezia. GU:  Enlarged  prostate.  No urgency, frequency, dysuria, or hematuria. Musculoskeletal:  No back pain.  No joint pain.  No muscle tenderness. Extremities:  No pain or swelling. Skin:  No rashes or skin changes. Neuro:  No headache, numbness or weakness, balance or coordination issues. Endocrine:  No diabetes, thyroid issues, hot flashes or night sweats. Psych:  No mood changes, depression or anxiety. Pain:  No focal pain. Review of systems:  All other systems reviewed and found to be negative.  Physical Exam: Blood pressure 120/81, pulse 73, temperature 98 F (36.7 C), temperature source Tympanic, resp. rate 18, weight 155 lb 4 oz (70.4  kg). GENERAL:  Thin gentleman sitting comfortably in the exam room in no acute distress. MENTAL STATUS:  Alert and oriented to person, place and time. HEAD:  Short gray hair.  Graying beard.  Normocephalic, atraumatic, face symmetric, no Cushingoid features. EYES:  Black rimmed glasses.  Brown eyes.  Pupils equal round and reactive to light and accomodation.  No conjunctivitis or scleral icterus. ENT:  Oropharynx clear without lesion.  Upper dentures.  Tongue normal. Mucous membranes moist.  RESPIRATORY:  Clear to auscultation without rales, wheezes or rhonchi. CARDIOVASCULAR:  Regular rate and rhythm without murmur, rub or gallop. ABDOMEN:  Soft, non-tender, with active bowel sounds, and no hepatosplenomegaly.  No masses. SKIN:  No rashes, ulcers or lesions. EXTREMITIES: Hands cool.  No edema, no skin discoloration or tenderness.  No palpable cords. LYMPH NODES: No palpable cervical, supraclavicular, axillary or inguinal adenopathy  NEUROLOGICAL: Unremarkable. PSYCH:  Appropriate.   Appointment on 01/19/2017  Component Date Value Ref Range Status  . LDH 01/19/2017 130  98 - 192 U/L Final  . Sodium 01/19/2017 136  135 - 145 mmol/L Final  . Potassium 01/19/2017 4.0  3.5 - 5.1 mmol/L Final  . Chloride 01/19/2017 106  101 - 111 mmol/L Final  . CO2 01/19/2017 22  22 - 32 mmol/L Final  . Glucose, Bld 01/19/2017 119* 65 - 99 mg/dL Final  . BUN 01/19/2017 16  6 - 20 mg/dL Final  . Creatinine, Ser 01/19/2017 1.75* 0.61 - 1.24 mg/dL Final  . Calcium 01/19/2017 9.3  8.9 - 10.3 mg/dL Final  . Total Protein 01/19/2017 7.3  6.5 - 8.1 g/dL Final  . Albumin 01/19/2017 4.4  3.5 - 5.0 g/dL Final  . AST 01/19/2017 42* 15 - 41 U/L Final  . ALT 01/19/2017 49  17 - 63 U/L Final  . Alkaline Phosphatase 01/19/2017 72  38 - 126 U/L Final  . Total Bilirubin 01/19/2017 0.9  0.3 - 1.2 mg/dL Final  . GFR calc non Af Amer 01/19/2017 36* >60 mL/min Final  . GFR calc Af Amer 01/19/2017 41* >60 mL/min Final    Comment: (NOTE) The eGFR has been calculated using the CKD EPI equation. This calculation has not been validated in all clinical situations. eGFR's persistently <60 mL/min signify possible Chronic Kidney Disease.   . Anion gap 01/19/2017 8  5 - 15 Final  . WBC 01/19/2017 7.9  3.8 - 10.6 K/uL Final  . RBC 01/19/2017 4.46  4.40 - 5.90 MIL/uL Final  . Hemoglobin 01/19/2017 14.3  13.0 - 18.0 g/dL Final  . HCT 01/19/2017 42.7  40.0 - 52.0 % Final  . MCV 01/19/2017 95.7  80.0 - 100.0 fL Final  . MCH 01/19/2017 32.2  26.0 - 34.0 pg Final  . MCHC 01/19/2017 33.6  32.0 - 36.0 g/dL Final  . RDW 01/19/2017 13.6  11.5 - 14.5 % Final  .  Platelets 01/19/2017 184  150 - 440 K/uL Final  . Neutrophils Relative % 01/19/2017 65  % Final  . Neutro Abs 01/19/2017 5.2  1.4 - 6.5 K/uL Final  . Lymphocytes Relative 01/19/2017 23  % Final  . Lymphs Abs 01/19/2017 1.8  1.0 - 3.6 K/uL Final  . Monocytes Relative 01/19/2017 8  % Final  . Monocytes Absolute 01/19/2017 0.7  0.2 - 1.0 K/uL Final  . Eosinophils Relative 01/19/2017 3  % Final  . Eosinophils Absolute 01/19/2017 0.2  0 - 0.7 K/uL Final  . Basophils Relative 01/19/2017 1  % Final  . Basophils Absolute 01/19/2017 0.1  0 - 0.1 K/uL Final    Assessment:  Robert Weeks is a 76 y.o. 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  He presented with a right sided pleural effusion, retoperitoneal, and axillary adenopathy in 03/2009.  Pleural fluid on 03/30/2009 suggested the presence of a lymphoma of follicle center cell origin. Cytology was inconclusive.  Bone marrow biopsy on 04/04/2009 was negative.  Left axillary biopsy on 04/17/2009 were consistent with diffuse large B cell lymphoma and follicular lymphoma, grade 3.    He received 8 cycles of RCHOP from 04/24/2009- 09/26/2009.  He tolerated his chemotherapy well. He received maintainance Rituxan every 2 months beginning 12/12/09 until 03/19/2011 (9 of 12  planned treatments).    PET scan on 09/10/2012 revealed stable nodes.  CT scan on 07/10/2014 revealed mesenteric root and retroperitoneal stranding without overt nodularity characteristic of treated lymphoma and stable from prior exams. There was wall thickening at the anal rectal junction. 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  Chest, abdomen, and pelvic CT on 07/10/2015 revealed stable abnormal soft tissue along the celiac axis, SMA, and mesenteric root/jejunal mesentery, suggesting treated lymphoma.  There was no suspicious lymphadenopathy.  He has a 3.8 cm infrarenal abdominal aortic aneurysm.   Chest, abdomen, and pelvic CT on 07/09/2016 revealed evidence of treated lymphoma in the chest and abdomen.  There were no findings to suggest recurrent disease.   He underwent colonoscopy on 08/25/2014. There was a medium polyp in the ascending colon (tubular adenoma).  There was diverticulosis  in the sigmoid and ascending colon.  Random colon biopsies were negative.  He has chronic intermittent diarrhea.  He denies any melena or hematochezia.  He has B12 deficiency.  B12 was 164 (low) on 01/01/2015.  Folate was 6.6 (> 5.9).  Follow-up B12 was 438 (normal) on 01/14/2016.  Symptomatically, he denies any B symptoms. Exam reveals no adenopathy or hepatosplenomegaly.  Creatinine is elevated at 1.75.  Plan: 1.  Labs today:  CBC with diff, CMP, LDH, uric acid, B12, folate. 2.  Discuss decreased renal function. Creatinine has trended up to 1.75. Encouraged to increase fluid intake and to discuss with PCP. 3.  Anticipate imaging studies next year. 4.  RTC in 6 months for MD assessment and labs (CBC with diff, CMP, LDH, uric acid).   Honor Loh, NP  01/19/2017, 11:55 AM   I saw and evaluated the patient, participating in the key portions of the service and reviewing pertinent diagnostic studies and records.  I reviewed the nurse practitioner's note and agree with the findings and  the plan.  The assessment and plan were discussed with the patient.  A few questions were asked by the patient and answered.   Nolon Stalls, MD 01/19/2017,11:55 AM

## 2017-01-19 ENCOUNTER — Inpatient Hospital Stay (HOSPITAL_BASED_OUTPATIENT_CLINIC_OR_DEPARTMENT_OTHER): Payer: Medicare Other | Admitting: Hematology and Oncology

## 2017-01-19 ENCOUNTER — Other Ambulatory Visit: Payer: Self-pay | Admitting: *Deleted

## 2017-01-19 ENCOUNTER — Inpatient Hospital Stay: Payer: Medicare Other | Attending: Hematology and Oncology

## 2017-01-19 ENCOUNTER — Encounter (INDEPENDENT_AMBULATORY_CARE_PROVIDER_SITE_OTHER): Payer: Self-pay

## 2017-01-19 VITALS — BP 120/81 | HR 73 | Temp 98.0°F | Resp 18 | Wt 155.2 lb

## 2017-01-19 DIAGNOSIS — M109 Gout, unspecified: Secondary | ICD-10-CM | POA: Insufficient documentation

## 2017-01-19 DIAGNOSIS — Z86010 Personal history of colonic polyps: Secondary | ICD-10-CM | POA: Diagnosis not present

## 2017-01-19 DIAGNOSIS — Z87891 Personal history of nicotine dependence: Secondary | ICD-10-CM | POA: Insufficient documentation

## 2017-01-19 DIAGNOSIS — Z9221 Personal history of antineoplastic chemotherapy: Secondary | ICD-10-CM | POA: Diagnosis not present

## 2017-01-19 DIAGNOSIS — D649 Anemia, unspecified: Secondary | ICD-10-CM

## 2017-01-19 DIAGNOSIS — J449 Chronic obstructive pulmonary disease, unspecified: Secondary | ICD-10-CM | POA: Insufficient documentation

## 2017-01-19 DIAGNOSIS — Z79899 Other long term (current) drug therapy: Secondary | ICD-10-CM | POA: Insufficient documentation

## 2017-01-19 DIAGNOSIS — I10 Essential (primary) hypertension: Secondary | ICD-10-CM | POA: Insufficient documentation

## 2017-01-19 DIAGNOSIS — E538 Deficiency of other specified B group vitamins: Secondary | ICD-10-CM | POA: Insufficient documentation

## 2017-01-19 DIAGNOSIS — N400 Enlarged prostate without lower urinary tract symptoms: Secondary | ICD-10-CM | POA: Diagnosis not present

## 2017-01-19 DIAGNOSIS — I714 Abdominal aortic aneurysm, without rupture: Secondary | ICD-10-CM

## 2017-01-19 DIAGNOSIS — C8228 Follicular lymphoma grade III, unspecified, lymph nodes of multiple sites: Secondary | ICD-10-CM | POA: Diagnosis present

## 2017-01-19 DIAGNOSIS — Z9225 Personal history of immunosupression therapy: Secondary | ICD-10-CM

## 2017-01-19 DIAGNOSIS — R490 Dysphonia: Secondary | ICD-10-CM

## 2017-01-19 LAB — COMPREHENSIVE METABOLIC PANEL
ALT: 49 U/L (ref 17–63)
AST: 42 U/L — ABNORMAL HIGH (ref 15–41)
Albumin: 4.4 g/dL (ref 3.5–5.0)
Alkaline Phosphatase: 72 U/L (ref 38–126)
Anion gap: 8 (ref 5–15)
BUN: 16 mg/dL (ref 6–20)
CO2: 22 mmol/L (ref 22–32)
Calcium: 9.3 mg/dL (ref 8.9–10.3)
Chloride: 106 mmol/L (ref 101–111)
Creatinine, Ser: 1.75 mg/dL — ABNORMAL HIGH (ref 0.61–1.24)
GFR calc Af Amer: 41 mL/min — ABNORMAL LOW (ref >60)
GFR calc non Af Amer: 36 mL/min — ABNORMAL LOW (ref >60)
Glucose, Bld: 119 mg/dL — ABNORMAL HIGH (ref 65–99)
Potassium: 4 mmol/L (ref 3.5–5.1)
Sodium: 136 mmol/L (ref 135–145)
Total Bilirubin: 0.9 mg/dL (ref 0.3–1.2)
Total Protein: 7.3 g/dL (ref 6.5–8.1)

## 2017-01-19 LAB — CBC WITH DIFFERENTIAL/PLATELET
Basophils Absolute: 0.1 10*3/uL (ref 0–0.1)
Basophils Relative: 1 %
Eosinophils Absolute: 0.2 10*3/uL (ref 0–0.7)
Eosinophils Relative: 3 %
HCT: 42.7 % (ref 40.0–52.0)
Hemoglobin: 14.3 g/dL (ref 13.0–18.0)
Lymphocytes Relative: 23 %
Lymphs Abs: 1.8 10*3/uL (ref 1.0–3.6)
MCH: 32.2 pg (ref 26.0–34.0)
MCHC: 33.6 g/dL (ref 32.0–36.0)
MCV: 95.7 fL (ref 80.0–100.0)
Monocytes Absolute: 0.7 10*3/uL (ref 0.2–1.0)
Monocytes Relative: 8 %
Neutro Abs: 5.2 10*3/uL (ref 1.4–6.5)
Neutrophils Relative %: 65 %
Platelets: 184 10*3/uL (ref 150–440)
RBC: 4.46 MIL/uL (ref 4.40–5.90)
RDW: 13.6 % (ref 11.5–14.5)
WBC: 7.9 10*3/uL (ref 3.8–10.6)

## 2017-01-19 LAB — FOLATE: Folate: 6 ng/mL (ref >5.9)

## 2017-01-19 LAB — URIC ACID: Uric Acid, Serum: 6.3 mg/dL (ref 4.4–7.6)

## 2017-01-19 LAB — LACTATE DEHYDROGENASE: LDH: 130 U/L (ref 98–192)

## 2017-01-19 LAB — VITAMIN B12: Vitamin B-12: 141 pg/mL — ABNORMAL LOW (ref 180–914)

## 2017-01-19 NOTE — Progress Notes (Signed)
Pt in today reports feeling well.  Denies any concerns or difficulties.

## 2017-01-20 ENCOUNTER — Telehealth: Payer: Self-pay | Admitting: *Deleted

## 2017-01-20 ENCOUNTER — Other Ambulatory Visit: Payer: Self-pay | Admitting: *Deleted

## 2017-01-20 DIAGNOSIS — E538 Deficiency of other specified B group vitamins: Secondary | ICD-10-CM

## 2017-01-20 NOTE — Telephone Encounter (Signed)
Called patient and LVM that his B-12 is low.  Per MD if he is not taking a B-12 supplement to start one.  Will recheck @ his next lab visit 07-17-16.

## 2017-01-20 NOTE — Telephone Encounter (Signed)
-----   Message from Lequita Asal, MD sent at 01/20/2017  1:20 AM EST ----- Regarding: Please contact patient  B12 low ([phone]).  He has a h/o B12 deficiency with a normal B12 1 year ago.  Did he stop supplementation?  Was he on oral B12 or injections?  We need to restart B12.    Next lab draw on return add anti-parietal antibody and intrinsic factor antibody.  M  ----- Message ----- From: Interface, Lab In [place] Sent: 01/19/2017  11:17 AM To: Lequita Asal, MD

## 2017-01-21 NOTE — Progress Notes (Signed)
01/22/2017 1:56 PM   Robert Weeks 1940/07/20 [id number]  Referring provider: Leonel Ramsay, MD [address], [place] [address]  Chief Complaint  Patient presents with  . Benign Prostatic Hypertrophy    HPI: Patient is a 76 year old [place] American male with erectile dysfunction and BPH with LUTS who presents today for his 12 month follow up.  Erectile dysfunction His SHIM score is 15, which is mild to moderate ED.   His previous SHIM score was 14.  He has been having difficulty with erections for over three years.   His major complaint is maintaining and firmness of the erections.  His libido is preserved.   His risk factors for ED are HTN, BPH and age.  He denies any painful erections or curvatures with his erections.   He has tried PDE5-inhibitors in the past and they were effective.  He is not interested in treating his ED at this time due to his new onset of seizures.   SHIM    Row Name 01/22/17 1346         SHIM: Over the last 6 months:   How do you rate your confidence that you could get and keep an erection?  Very High     When you had erections with sexual stimulation, how often were your erections hard enough for penetration (entering your partner)?  Almost Never or Never     During sexual intercourse, how often were you able to maintain your erection after you had penetrated (entered) your partner?  A Few Times (much less than half the time)     During sexual intercourse, how difficult was it to maintain your erection to completion of intercourse?  Difficult     When you attempted sexual intercourse, how often was it satisfactory for you?  Most Times (much more than half the time)       SHIM Total Score   SHIM  15        Score: 1-7 Severe ED 8-11 Moderate ED 12-16 Mild-Moderate ED 17-21 Mild ED 22-25 No ED   BPH WITH LUTS His IPSS score today is 0, which is no lower urinary tract symptomatology. He is delighted with his quality life  due to his urinary symptoms.  His previous IPSS score was 4/1.  His complaints today are intermittency, weak stream and nocturia x 2.  He denies any dysuria, hematuria or suprapubic pain.   He was on finasteride in the past, but he discontinued the medication due to dizzy spells.  He also denies any recent fevers, chills, nausea or vomiting.  He does not have a family history of PCa.  IPSS    Row Name 01/22/17 1300         International Prostate Symptom Score   How often have you had the sensation of not emptying your bladder?  Not at All     How often have you had to urinate less than every two hours?  Not at All     How often have you found you stopped and started again several times when you urinated?  Not at All     How often have you found it difficult to postpone urination?  Not at All     How often have you had a weak urinary stream?  Not at All     How often have you had to strain to start urination?  Not at All     How many times did you  typically get up at night to urinate?  None     Total IPSS Score  0       Quality of Life due to urinary symptoms   If you were to spend the rest of your life with your urinary condition just the way it is now how would you feel about that?  Delighted        Score:  1-7 Mild 8-19 Moderate 20-35 Severe   PMH: Past Medical History:  Diagnosis Date  . Asthma   . BPH (benign prostatic hyperplasia)   . Cancer [facility])    lymphom dx in 2011 in remission  . COPD (chronic obstructive pulmonary disease) ([place])   . Elevated PSA   . Erectile dysfunction   . Gout   . Hypertension   . Lymphoma ([place])   . Seizures [facility])     Surgical History: Past Surgical History:  Procedure Laterality Date  . COLONOSCOPY WITH PROPOFOL N/A 08/25/2014   Procedure: COLONOSCOPY WITH PROPOFOL;  Surgeon: Hulen Luster, MD;  Location[facility] ENDOSCOPY;  Service: Gastroenterology;  Laterality: N/A;  . CYSTOSCOPY    . lymphectomy      Home Medications:  Allergies as of  01/22/2017      Reactions   Finasteride       Medication List        Accurate as of 01/22/17  1:56 PM. Always use your most recent med list.          ADVAIR DISKUS 250-50 MCG/DOSE Aepb Generic drug:  Fluticasone-Salmeterol Inhale 1 puff into the lungs 2 (two) times daily.   albuterol 108 (90 Base) MCG/ACT inhaler Commonly known as:  PROVENTIL HFA;VENTOLIN HFA Inhale 2 puffs into the lungs every 6 (six) hours as needed.   lamoTRIgine 25 MG tablet Commonly known as:  LAMICTAL Take 50 mg by mouth 2 (two) times daily.   levETIRAcetam 750 MG tablet Commonly known as:  KEPPRA Take 750 mg by mouth 2 (two) times daily.   lisinopril 5 MG tablet Commonly known as:  PRINIVIL,ZESTRIL Take 5 mg by mouth daily.   potassium chloride SA 20 MEQ tablet Commonly known as:  K-DUR,KLOR-CON Take 20 mEq by mouth daily.   sildenafil 20 MG tablet Commonly known as:  REVATIO Take 3 to 5 tablets two hours before intercouse on an empty stomach.  Do not take with nitrates.       Allergies:  Allergies  Allergen Reactions  . Finasteride     Family History: Family History  Problem Relation Age of Onset  . Kidney disease Father 25  . Prostate cancer Neg Hx   . Bladder Cancer Neg Hx     Social History:  reports that he quit smoking about 7 years ago. His smokeless tobacco use includes snuff. He reports that he drinks alcohol. He reports that he does not use drugs.  ROS: UROLOGY Frequent Urination?: No Hard to postpone urination?: No Burning/pain with urination?: No Get up at night to urinate?: No Leakage of urine?: No Urine stream starts and stops?: No Trouble starting stream?: No Do you have to strain to urinate?: No Blood in urine?: No Urinary tract infection?: No Sexually transmitted disease?: No Injury to kidneys or bladder?: No Painful intercourse?: No Weak stream?: No Erection problems?: No Penile pain?: No  Gastrointestinal Nausea?: No Vomiting?:  No Indigestion/heartburn?: No Diarrhea?: No Constipation?: No  Constitutional Fever: No Night sweats?: No Weight loss?: No Fatigue?: No  Skin Skin rash/lesions?: No Itching?: No  Eyes Blurred  vision?: No Double vision?: No  Ears/Nose/Throat Sore throat?: No Sinus problems?: No  Hematologic/Lymphatic Swollen glands?: No Easy bruising?: No  Cardiovascular Leg swelling?: No Chest pain?: No  Respiratory Cough?: No Shortness of breath?: No  Endocrine Excessive thirst?: No  Musculoskeletal Back pain?: No Joint pain?: No  Neurological Headaches?: No Dizziness?: No  Psychologic Depression?: No Anxiety?: No  Physical Exam: BP (!) 146/89   Pulse 93   Ht 5\' 10"  (1.778 m)   Wt 151 lb 14.4 oz (68.9 kg)   BMI 21.80 kg/m   GU: No CVA tenderness.  No bladder fullness or masses.  Patient with circumcised phallus.   Urethral meatus is patent.  No penile discharge. No penile lesions or rashes. Scrotum without lesions, cysts, rashes and/or edema.  Testicles are located scrotally bilaterally. No masses are appreciated in the testicles. Left and right epididymis are normal. Rectal: Patient with  normal sphincter tone. Anus and perineum without scarring or rashes. No rectal masses are appreciated. Prostate is approximately 60 grams, no nodules are appreciated. Seminal vesicles are normal.    Laboratory Data: Lab Results  Component Value Date   WBC 7.9 01/19/2017   HGB 14.3 01/19/2017   HCT 42.7 01/19/2017   MCV 95.7 01/19/2017   PLT 184 01/19/2017    Lab Results  Component Value Date   CREATININE 1.75 (H) 01/19/2017   PSA History  4.0 ng/mL on 04/06/2013  3.8 ng/mL on 10/12/2013  1.0 ng/mL on 04/14/2014 Lab Results  Component Value Date   PSA 3.1 06/25/2012   PSA 5.0 (H) 05/26/2011    I have reviewed the labs.   Assessment & Plan:    1. BPH with LUTS:   IPSS score is 0/1, it is improving.  We will continue to monitor.  He will return to clinic in  one year for IPSS score and exam.    2. Erectile dysfunction:   Not interested in any treatment at this time.    Return in about 1 year (around 01/22/2018) for I PSS and exam.  Zara Council, [facility] [address], [place], [place] [address] [phone]

## 2017-01-22 ENCOUNTER — Ambulatory Visit: Payer: Medicare Other | Admitting: Urology

## 2017-01-22 ENCOUNTER — Encounter: Payer: Self-pay | Admitting: Urology

## 2017-01-22 VITALS — BP 146/89 | HR 93 | Ht 70.0 in | Wt 151.9 lb

## 2017-01-22 DIAGNOSIS — N401 Enlarged prostate with lower urinary tract symptoms: Secondary | ICD-10-CM

## 2017-01-22 DIAGNOSIS — N138 Other obstructive and reflux uropathy: Secondary | ICD-10-CM

## 2017-01-22 DIAGNOSIS — N529 Male erectile dysfunction, unspecified: Secondary | ICD-10-CM | POA: Diagnosis not present

## 2017-01-25 ENCOUNTER — Encounter: Payer: Self-pay | Admitting: Hematology and Oncology

## 2017-07-17 ENCOUNTER — Inpatient Hospital Stay (HOSPITAL_BASED_OUTPATIENT_CLINIC_OR_DEPARTMENT_OTHER): Payer: Medicare Other | Admitting: Hematology and Oncology

## 2017-07-17 ENCOUNTER — Inpatient Hospital Stay: Payer: Medicare Other | Attending: Hematology and Oncology

## 2017-07-17 ENCOUNTER — Encounter: Payer: Self-pay | Admitting: Hematology and Oncology

## 2017-07-17 ENCOUNTER — Other Ambulatory Visit: Payer: Self-pay | Admitting: Hematology and Oncology

## 2017-07-17 ENCOUNTER — Other Ambulatory Visit: Payer: Self-pay

## 2017-07-17 VITALS — BP 120/70 | HR 67 | Temp 98.2°F | Resp 18 | Ht 70.0 in | Wt 154.5 lb

## 2017-07-17 DIAGNOSIS — E538 Deficiency of other specified B group vitamins: Secondary | ICD-10-CM | POA: Insufficient documentation

## 2017-07-17 DIAGNOSIS — C8228 Follicular lymphoma grade III, unspecified, lymph nodes of multiple sites: Secondary | ICD-10-CM | POA: Diagnosis not present

## 2017-07-17 DIAGNOSIS — I10 Essential (primary) hypertension: Secondary | ICD-10-CM | POA: Diagnosis not present

## 2017-07-17 DIAGNOSIS — Z87891 Personal history of nicotine dependence: Secondary | ICD-10-CM | POA: Diagnosis not present

## 2017-07-17 DIAGNOSIS — R7989 Other specified abnormal findings of blood chemistry: Secondary | ICD-10-CM | POA: Diagnosis not present

## 2017-07-17 DIAGNOSIS — Z9221 Personal history of antineoplastic chemotherapy: Secondary | ICD-10-CM | POA: Insufficient documentation

## 2017-07-17 DIAGNOSIS — D649 Anemia, unspecified: Secondary | ICD-10-CM

## 2017-07-17 LAB — CBC WITH DIFFERENTIAL/PLATELET
Basophils Absolute: 0 10*3/uL (ref 0–0.1)
Basophils Relative: 0 %
Eosinophils Absolute: 0.1 10*3/uL (ref 0–0.7)
Eosinophils Relative: 2 %
HCT: 40.8 % (ref 40.0–52.0)
Hemoglobin: 13.9 g/dL (ref 13.0–18.0)
Lymphocytes Relative: 22 %
Lymphs Abs: 1.6 10*3/uL (ref 1.0–3.6)
MCH: 33.5 pg (ref 26.0–34.0)
MCHC: 34.1 g/dL (ref 32.0–36.0)
MCV: 98.2 fL (ref 80.0–100.0)
Monocytes Absolute: 0.7 10*3/uL (ref 0.2–1.0)
Monocytes Relative: 9 %
Neutro Abs: 4.9 10*3/uL (ref 1.4–6.5)
Neutrophils Relative %: 67 %
Platelets: 214 10*3/uL (ref 150–440)
RBC: 4.15 MIL/uL — ABNORMAL LOW (ref 4.40–5.90)
RDW: 13.9 % (ref 11.5–14.5)
WBC: 7.4 10*3/uL (ref 3.8–10.6)

## 2017-07-17 LAB — COMPREHENSIVE METABOLIC PANEL
ALT: 31 U/L (ref 17–63)
AST: 32 U/L (ref 15–41)
Albumin: 4.2 g/dL (ref 3.5–5.0)
Alkaline Phosphatase: 67 U/L (ref 38–126)
Anion gap: 7 (ref 5–15)
BUN: 11 mg/dL (ref 6–20)
CO2: 22 mmol/L (ref 22–32)
Calcium: 9.2 mg/dL (ref 8.9–10.3)
Chloride: 109 mmol/L (ref 101–111)
Creatinine, Ser: 1.62 mg/dL — ABNORMAL HIGH (ref 0.61–1.24)
GFR calc Af Amer: 45 mL/min — ABNORMAL LOW (ref >60)
GFR calc non Af Amer: 39 mL/min — ABNORMAL LOW (ref >60)
Glucose, Bld: 88 mg/dL (ref 65–99)
Potassium: 3.6 mmol/L (ref 3.5–5.1)
Sodium: 138 mmol/L (ref 135–145)
Total Bilirubin: 0.9 mg/dL (ref 0.3–1.2)
Total Protein: 7.3 g/dL (ref 6.5–8.1)

## 2017-07-17 LAB — VITAMIN B12: Vitamin B-12: 1252 pg/mL — ABNORMAL HIGH (ref 180–914)

## 2017-07-17 LAB — FOLATE: Folate: 6.5 ng/mL (ref >5.9)

## 2017-07-17 LAB — URIC ACID: Uric Acid, Serum: 5.9 mg/dL (ref 4.4–7.6)

## 2017-07-17 LAB — LACTATE DEHYDROGENASE: LDH: 129 U/L (ref 98–192)

## 2017-07-17 NOTE — Progress Notes (Signed)
No new changes noted today 

## 2017-07-17 NOTE — Progress Notes (Signed)
[facility] day:  07/17/17   Chief Complaint: Robert Weeks is a 77 y.o. 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  HPI:   The patient was last seen in the medical oncology clinic on 01/19/2017.  At that time, he denied any B symptoms. Exam revealed no adenopathy or hepatosplenomegaly.  CBC was normal.  Creatinine was elevated at 1.75. B12 was 141 (low).  Folate was normal.  He was contacted after his clinic appointment about his low B12.  Oral B12 supplementation was recommended with plan for B12 injections if his level did not improve. He has been on oral B12 since March 2019.   During the interim, patient has been doing "wonderful". He states, "I have no aches, no pains, and I haven't had any falls". He has chronic shortness of breath related to his COPD diagnosis. Patient continues to have a hoarse voice quality. He was seen by ENT recently and speech therapy was recommended.   Patient denies any B symptoms. He had not experienced any recent infections. He has not appreciated any new areas of palpable adenopathy. Patient is eating well. His weight is down 1 pound. Patient denies pain in the clinic today.    Past Medical History:  Diagnosis Date  . Asthma   . BPH (benign prostatic hyperplasia)   . Cancer [facility])    lymphom dx in 2011 in remission  . COPD (chronic obstructive pulmonary disease) ([place])   . Elevated PSA   . Erectile dysfunction   . Gout   . Hypertension   . Lymphoma ([place])   . Seizures ([place])     Past Surgical History:  Procedure Laterality Date  . COLONOSCOPY WITH PROPOFOL N/A 08/25/2014   Procedure: COLONOSCOPY WITH PROPOFOL;  Surgeon: Hulen Luster, MD;  Location: [facility] ENDOSCOPY;  Service: Gastroenterology;  Laterality: N/A;  . CYSTOSCOPY    . lymphectomy      Family History  Problem Relation Age of Onset  . Kidney disease Father 8  . Prostate cancer  Neg Hx   . Bladder Cancer Neg Hx     Social History:  reports that he quit smoking about 8 years ago. His smokeless tobacco use includes snuff. He reports that he drinks alcohol. He reports that he does not use drugs.  The patient is alone today.  Allergies:  Allergies  Allergen Reactions  . Finasteride     Current Medications: Current Outpatient Medications  Medication Sig Dispense Refill  . ADVAIR DISKUS 250-50 MCG/DOSE AEPB Inhale 1 puff into the lungs 2 (two) times daily.     Marland Kitchen lamoTRIgine (LAMICTAL) 25 MG tablet Take 50 mg by mouth 2 (two) times daily.     Marland Kitchen levETIRAcetam (KEPPRA) 750 MG tablet Take 750 mg by mouth 2 (two) times daily.     Marland Kitchen lisinopril (PRINIVIL,ZESTRIL) 5 MG tablet Take 5 mg by mouth daily.     . potassium chloride SA (K-DUR,KLOR-CON) 20 MEQ tablet Take 20 mEq by mouth daily.    Marland Kitchen albuterol (PROVENTIL HFA;VENTOLIN HFA) 108 (90 Base) MCG/ACT inhaler Inhale 2 puffs into the lungs every 6 (six) hours as needed.     . sildenafil (REVATIO) 20 MG tablet Take 3 to 5 tablets two hours before intercouse on an empty stomach.  Do not take with nitrates. (Patient not taking: Reported on 01/19/2017) 50 tablet 3   No current facility-administered medications for this visit.  Review of Systems  Constitutional: Positive for weight loss (down 1 pound). Negative for diaphoresis, fever and malaise/fatigue.       Feels wonderful.  HENT: Negative for nosebleeds and sore throat.        Hoarse voice quality (seen by ENT; no polyps)  Eyes: Negative.   Respiratory: Negative for cough, hemoptysis, sputum production and shortness of breath (chronic; related to COPD).   Cardiovascular: Negative for chest pain, palpitations, orthopnea, leg swelling and PND.  Gastrointestinal: Negative for abdominal pain, blood in stool, constipation, diarrhea, melena, nausea and vomiting.  Genitourinary: Negative for dysuria, frequency, hematuria and urgency.       BPH  Musculoskeletal: Negative for  back pain, falls, joint pain and myalgias.  Skin: Negative for itching and rash.  Neurological: Negative for dizziness, tremors, weakness and headaches.  Endo/Heme/Allergies: Does not bruise/bleed easily.  Psychiatric/Behavioral: Negative for depression, memory loss and suicidal ideas. The patient is not nervous/anxious and does not have insomnia.   All other systems reviewed and are negative.  Performance status (ECOG): 1 - Symptomatic but completely ambulatory   Physical Exam: Blood pressure 120/70, pulse 67, temperature 98.2 F (36.8 C), temperature source Oral, resp. rate 18, height 5' 10"  (1.778 m), weight 154 lb 8 oz (70.1 kg), SpO2 98 %. GENERAL:  Thin gentleman sitting comfortably in the exam room in no acute distress. MENTAL STATUS:  Alert and oriented to person, place and time. HEAD:  Short gray hair with goatee.  Normocephalic, atraumatic, face symmetric, no Cushingoid features. EYES:  Glasses.  Brown eyes.  Pupils equal round and reactive to light and accomodation.  No conjunctivitis or scleral icterus. ENT:  Hoarse.  Oropharynx clear without lesion.  Tongue normal. Mucous membranes moist.  RESPIRATORY:  Clear to auscultation without rales, wheezes or rhonchi. CARDIOVASCULAR:  Regular rate and rhythm without murmur, rub or gallop. ABDOMEN:  Soft, non-tender, with active bowel sounds, and no hepatosplenomegaly.  No masses. SKIN:  No rashes, ulcers or lesions. EXTREMITIES: No edema, no skin discoloration or tenderness.  No palpable cords. LYMPH NODES: No palpable cervical, supraclavicular, axillary or inguinal adenopathy  NEUROLOGICAL: Unremarkable. PSYCH:  Appropriate.    Imaging studies: 09/10/2012:  PET scan revealed stable nodes.   07/10/2014:  Abdomen and pelvis CT revealed mesenteric root and retroperitoneal stranding without overt nodularity characteristic of treated lymphoma and stable from prior exams. There was wall thickening at the anal rectal junction. He had a 3.7  cm bilobed infrarenal abdominal aortic aneurysm. 07/10/2015:  Chest, abdomen, and pelvic CT revealed stable abnormal soft tissue along the celiac axis, SMA, and mesenteric root/jejunal mesentery, suggesting treated lymphoma.  There was no suspicious lymphadenopathy.  He has a 3.8 cm infrarenal abdominal aortic aneurysm.  07/08/2016:  Chest, abdomen, and pelvic CT revealed evidence of treated lymphoma in the chest and abdomen.  There were no findings to suggest recurrent disease.    Orders Only on 07/17/2017  Component Date Value Ref Range Status  . Sodium 07/17/2017 138  135 - 145 mmol/L Final  . Potassium 07/17/2017 3.6  3.5 - 5.1 mmol/L Final  . Chloride 07/17/2017 109  101 - 111 mmol/L Final  . CO2 07/17/2017 22  22 - 32 mmol/L Final  . Glucose, Bld 07/17/2017 88  65 - 99 mg/dL Final  . BUN 07/17/2017 11  6 - 20 mg/dL Final  . Creatinine, Ser 07/17/2017 1.62* 0.61 - 1.24 mg/dL Final  . Calcium 07/17/2017 9.2  8.9 - 10.3 mg/dL Final  . Total Protein  07/17/2017 7.3  6.5 - 8.1 g/dL Final  . Albumin 07/17/2017 4.2  3.5 - 5.0 g/dL Final  . AST 07/17/2017 32  15 - 41 U/L Final  . ALT 07/17/2017 31  17 - 63 U/L Final  . Alkaline Phosphatase 07/17/2017 67  38 - 126 U/L Final  . Total Bilirubin 07/17/2017 0.9  0.3 - 1.2 mg/dL Final  . GFR calc non Af Amer 07/17/2017 39* >60 mL/min Final  . GFR calc Af Amer 07/17/2017 45* >60 mL/min Final   Comment: (NOTE) The eGFR has been calculated using the CKD EPI equation. This calculation has not been validated in all clinical situations. eGFR's persistently <60 mL/min signify possible Chronic Kidney Disease.   Georgiann Hahn gap 07/17/2017 7  5 - 15 Final   Performed at [facility], [address] [place], [place] [address]  . LDH 07/17/2017 129  98 - 192 U/L Final   Performed at [facility], [address] [place], [place] [address]  . WBC 07/17/2017 7.4  3.8 - 10.6 K/uL Final  . RBC 07/17/2017 4.15* 4.40 - 5.90 MIL/uL Final  .  Hemoglobin 07/17/2017 13.9  13.0 - 18.0 g/dL Final  . HCT 07/17/2017 40.8  40.0 - 52.0 % Final  . MCV 07/17/2017 98.2  80.0 - 100.0 fL Final  . MCH 07/17/2017 33.5  26.0 - 34.0 pg Final  . MCHC 07/17/2017 34.1  32.0 - 36.0 g/dL Final  . RDW 07/17/2017 13.9  11.5 - 14.5 % Final  . Platelets 07/17/2017 214  150 - 440 K/uL Final  . Neutrophils Relative % 07/17/2017 67  % Final  . Neutro Abs 07/17/2017 4.9  1.4 - 6.5 K/uL Final  . Lymphocytes Relative 07/17/2017 22  % Final  . Lymphs Abs 07/17/2017 1.6  1.0 - 3.6 K/uL Final  . Monocytes Relative 07/17/2017 9  % Final  . Monocytes Absolute 07/17/2017 0.7  0.2 - 1.0 K/uL Final  . Eosinophils Relative 07/17/2017 2  % Final  . Eosinophils Absolute 07/17/2017 0.1  0 - 0.7 K/uL Final  . Basophils Relative 07/17/2017 0  % Final  . Basophils Absolute 07/17/2017 0.0  0 - 0.1 K/uL Final   Performed at [facility], [address], [place], [place] [address]    Assessment:  Donterius Filley is a 77 y.o. 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  He presented with a right sided pleural effusion, retoperitoneal, and axillary adenopathy in 03/2009.  Pleural fluid on 03/30/2009 suggested the presence of a lymphoma of follicle center cell origin. Cytology was inconclusive.  Bone marrow biopsy on 04/04/2009 was negative.  Left axillary biopsy on 04/17/2009 were consistent with diffuse large B cell lymphoma and follicular lymphoma, grade 3.    He received 8 cycles of RCHOP from 04/24/2009- 09/26/2009.  He tolerated his chemotherapy well. He received maintainance Rituxan every 2 months beginning 12/12/09 until 03/19/2011 (9 of 12 planned treatments).    PET scan on 09/10/2012 revealed stable nodes.  Chest, abdomen, and pelvic CT on 07/09/2016 revealed evidence of treated lymphoma in the chest and abdomen.  There were no findings to suggest recurrent disease.   He underwent colonoscopy on 08/25/2014.  There was a medium polyp in the ascending colon (tubular adenoma).  There was diverticulosis  in the sigmoid and ascending colon.  Random colon biopsies were negative.  He has chronic intermittent diarrhea.  He denies any melena or hematochezia.  He has  B12 deficiency.  He began oral B12 in 04/2017.  B12 was 141 (low) on 01/19/2017.  Symptomatically, he denies any B symptoms. Exam reveals no adenopathy or hepatosplenomegaly.  Creatinine is elevated at 1.62 (previously 1.75).  Plan: 1.  Labs today:  CBC with diff, CMP, LDH, uric acid, B12, folate, intrinsic factor antibody, anti-parietal antibody. 2.  Discuss B12 deficiency. Continue oral B12. B12 level drawn today. If low, we will contact patient to schedule B12 injections.  3.  Discuss decreased renal function. Creatinine improved to 1.62 (previoulsy 1.75).  Encouraged to increase fluid intake and to discuss with PCP. 4.  Schedule PET scan in 6 months.  Patient wishes to pursue follow-up imaging. 6.  RTC in 6 months for MD assessment, labs (CBC with diff, CMP, LDH, uric acid), and review of PET scans.    Honor Loh, NP  07/17/2017, 12:04 PM   I saw and evaluated the patient, participating in the key portions of the service and reviewing pertinent diagnostic studies and records.  I reviewed the nurse practitioner's note and agree with the findings and the plan.  The assessment and plan were discussed with the patient.  A few questions were asked by the patient and answered.   Nolon Stalls, MD 07/17/2017,12:04 PM

## 2017-07-18 LAB — ANTI-PARIETAL ANTIBODY: Parietal Cell Antibody-IgG: 11.2 Units (ref 0.0–20.0)

## 2017-07-22 LAB — INTRINSIC FACTOR ANTIBODIES: Intrinsic Factor: 1 AU/mL (ref 0.0–1.1)

## 2017-08-14 IMAGING — CT CT CHEST W/O CM
1 of 2 series · 12 of 32 positions shown, 15 images · non-contrast
Comparison: CT abdomen pelvis dated 07/10/2014. CT chest abdomen
pelvis dated 07/01/2012.

CLINICAL DATA: Stage IV diffuse large B-cell lymphoma diagnosed in
7388

EXAM:
CT CHEST, ABDOMEN AND PELVIS WITHOUT CONTRAST
TECHNIQUE: Multidetector CT imaging of the chest, abdomen and pelvis was
performed following the standard protocol without IV contrast.

[Series 2: axial st · axial · 0.79mm/px · z∈[-929,-389]mm · 12 of 133 slices shown, 15 images]
[im 13/133  mediastinal]
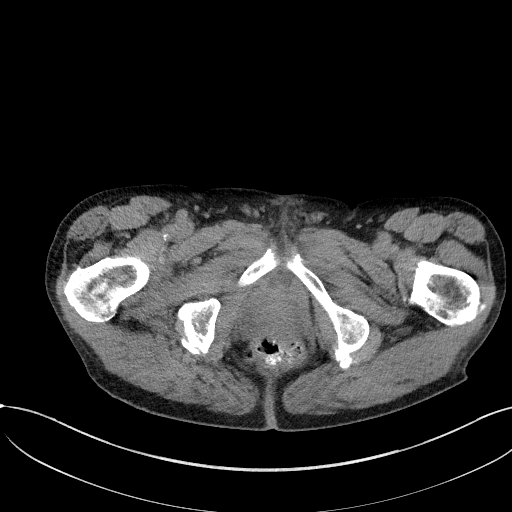
[im 13/133  lung]
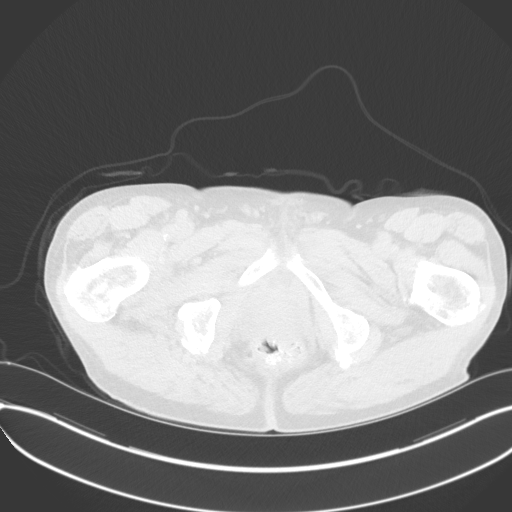
[im 25/133  lung]
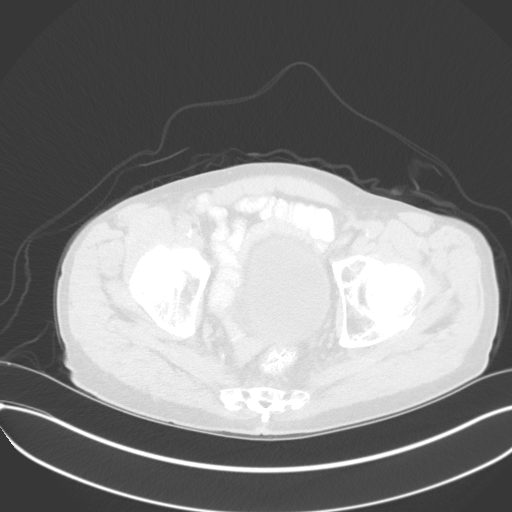
[im 37/133  lung]
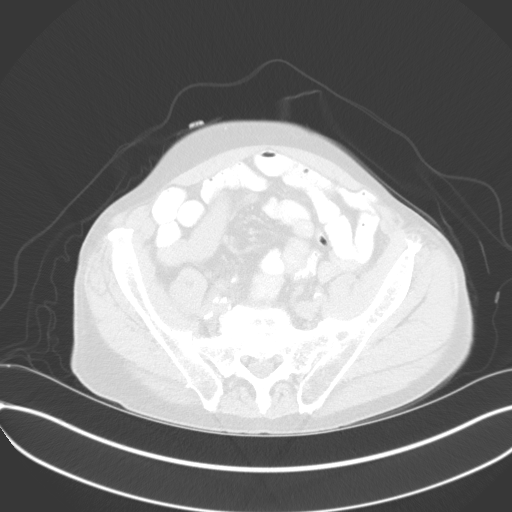
[im 49/133  lung]
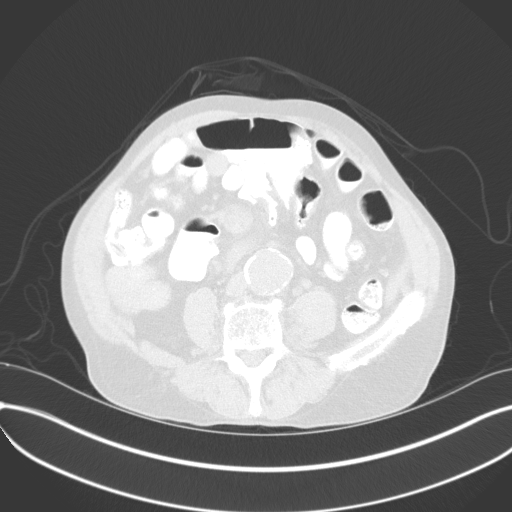
[im 61/133  mediastinal]
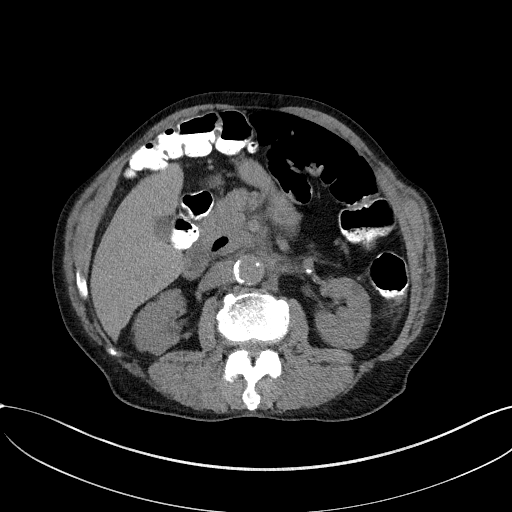
[im 61/133  lung]
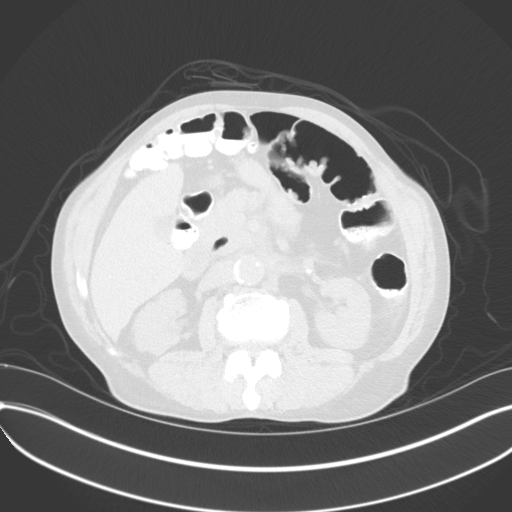
[im 65/133  lung]
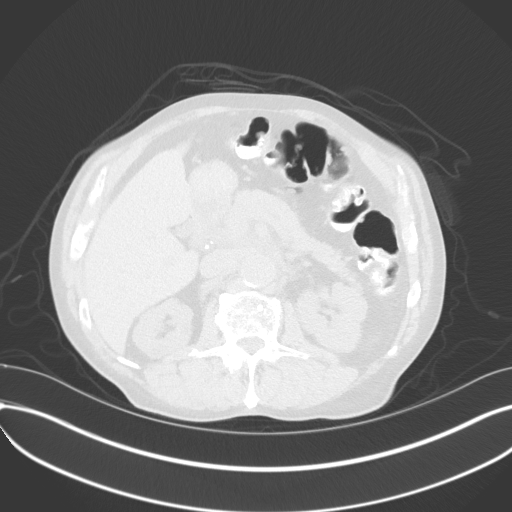
[im 67/133  lung]
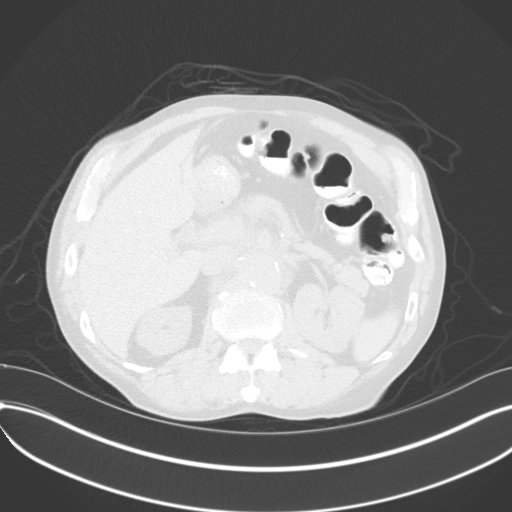
[im 73/133  lung]
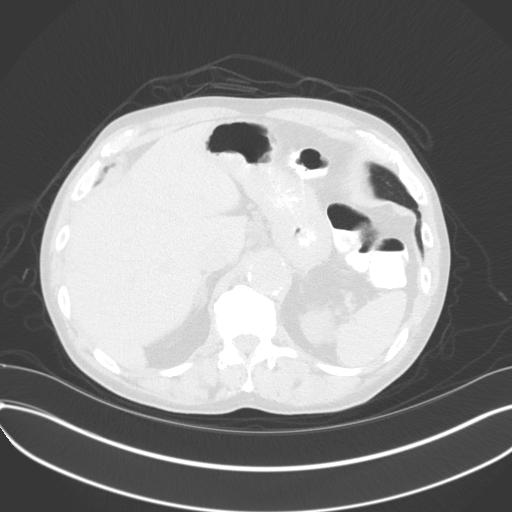
[im 85/133  mediastinal]
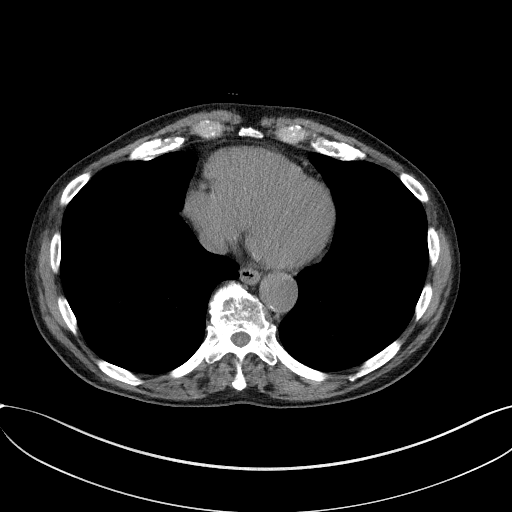
[im 85/133  lung]
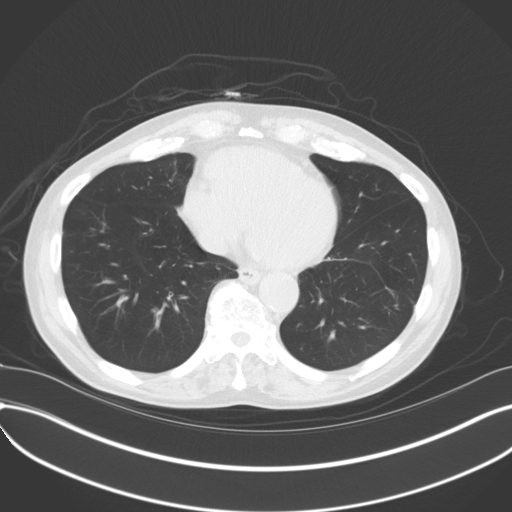
[im 97/133  lung]
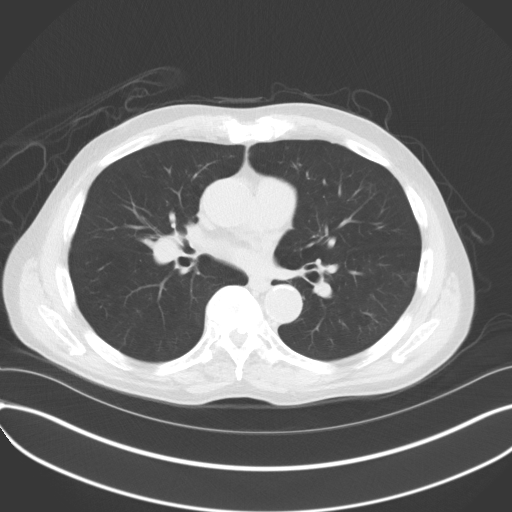
[im 109/133  lung]
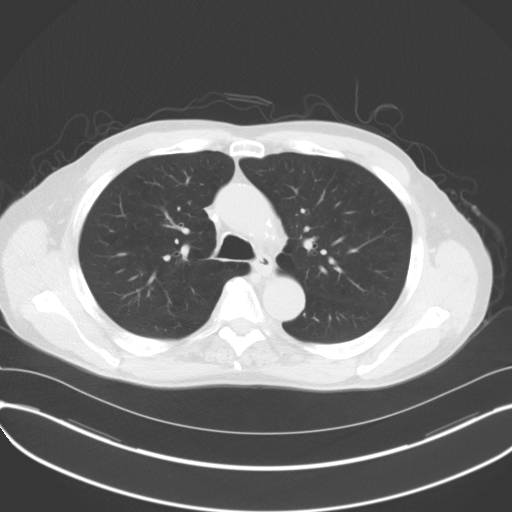
[im 121/133  lung]
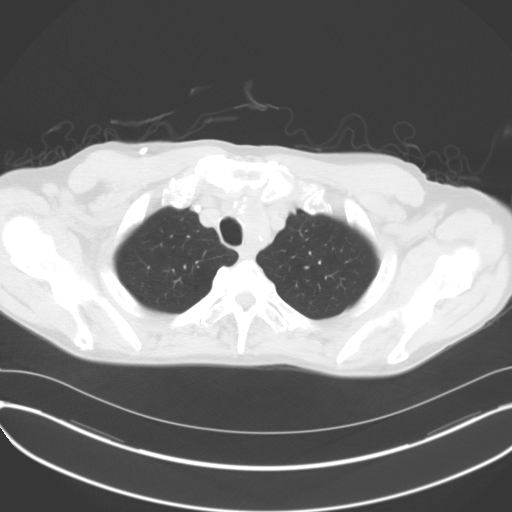

[12 of 32 positions shown; findings below may reference images not displayed]

FINDINGS: CT CHEST FINDINGS

Mediastinum/Nodes: Heart is normal in size. No pericardial effusion.

Three vessel coronary atherosclerosis.

Atherosclerotic calcifications of the aortic arch.

4.6 x 2.3 cm multilobulated, partially calcified left paratracheal
mass (series 2/ image 15), measuring 5.0 x 2.8 cm in 1315, grossly
unchanged. This may arise from the posterior/inferior aspect of the
left thyroid gland (series 2/ image 9), suggesting substernal
goiter. Regardless, stability favors a benign etiology.

Right chest port terminates in the mid SVC.

Lungs/Pleura: Lungs are essentially clear.

No suspicious pulmonary nodules.

Mild emphysematous changes. Mild bronchial wall thickening in the
bilateral lower lobes.

No focal consolidation.

No pleural effusion or pneumothorax.

Musculoskeletal: Degenerative changes of the thoracic spine.

CT ABDOMEN PELVIS FINDINGS

Hepatobiliary: Unenhanced liver is unremarkable.

Layering small gallstones. No gallbladder wall thickening or
pericholecystic fluid.

Pancreas: Within normal limits.

Spleen: Normal in size.

Adrenals/Urinary Tract: Adrenal glands are within normal limits.

3.0 cm posterior left lower pole renal cyst (series 2/image 80).
Additional smaller bilateral renal cysts, poorly characterized.
Bilateral renal cortical scarring. 3 mm calcification on the left is
favored to reflect a renal vascular calcification (series 2/image
70). No hydronephrosis.

Bladder is unremarkable.

Stomach/Bowel: Stomach is within normal limits.

No evidence of bowel obstruction. Mesenteric swirling in the right
lower quadrant, but without frank internal hernia.

Normal appendix (series 2/ image 94).

Vascular/Lymphatic: 3.8 x 3.8 cm infrarenal abdominal aortic
aneurysm (series 2/ image 85), previously 3.6 x 3.3 cm.
Atherosclerotic calcifications of the abdominal aorta and branch
vessels.

2.4 cm left common iliac artery aneurysm (series 2/ image 91).
cm right common iliac artery aneurysm (series 2/image 94).

Again noted is abnormal soft tissue along the celiac axis (series 2/
image 65), SMA (series 2/image 70), and mesenteric root/ jejunal
mesentery (series 2/ images 81 and 91), grossly unchanged,
suggesting treated lymphoma.

No suspicious/progressive lymphadenopathy.

Reproductive: Prostatomegaly, with enlargement of the central gland
which indents the base of the bladder (series 2/ image 116),
compatible with BPH.

Other: No abdominopelvic ascites.

Small right inguinal hernia containing a knuckle of small bowel
(series 2/ image 110), unchanged.

Musculoskeletal: Degenerative changes of the lumbar spine.
IMPRESSION: Stable abnormal soft tissue along the celiac axis, SMA, and
mesenteric root/jejunal mesentery, suggesting treated lymphoma.

No suspicious lymphadenopathy in the chest, abdomen, or pelvis.

Spleen is normal in size.

Stable partially calcified left paratracheal mass, possibly
reflecting substernal goiter, unchanged since 1315, favored to be
benign.

3.8 x 3.8 cm infrarenal abdominal aortic aneurysm, previously 3.6 x
3.3 cm. Recommend followup by ultrasound in 2 years. This
recommendation follows ACR consensus guidelines: White Paper of the
ACR Incidental Findings Committee II on Vascular Findings. [HOSPITAL] 8521; [DATE].

Additional ancillary findings as above.

## 2017-11-03 ENCOUNTER — Ambulatory Visit (INDEPENDENT_AMBULATORY_CARE_PROVIDER_SITE_OTHER): Payer: Medicare Other | Admitting: Vascular Surgery

## 2017-11-03 ENCOUNTER — Encounter (INDEPENDENT_AMBULATORY_CARE_PROVIDER_SITE_OTHER): Payer: Medicare Other

## 2017-11-06 ENCOUNTER — Encounter (INDEPENDENT_AMBULATORY_CARE_PROVIDER_SITE_OTHER): Payer: Self-pay

## 2017-11-06 ENCOUNTER — Ambulatory Visit (INDEPENDENT_AMBULATORY_CARE_PROVIDER_SITE_OTHER): Payer: Medicare Other | Admitting: Vascular Surgery

## 2017-11-06 ENCOUNTER — Ambulatory Visit (INDEPENDENT_AMBULATORY_CARE_PROVIDER_SITE_OTHER): Payer: Medicare Other

## 2017-11-06 ENCOUNTER — Encounter (INDEPENDENT_AMBULATORY_CARE_PROVIDER_SITE_OTHER): Payer: Self-pay | Admitting: Vascular Surgery

## 2017-11-06 VITALS — BP 112/72 | HR 81 | Resp 16 | Ht 70.0 in | Wt 151.0 lb

## 2017-11-06 DIAGNOSIS — I10 Essential (primary) hypertension: Secondary | ICD-10-CM | POA: Diagnosis not present

## 2017-11-06 DIAGNOSIS — I714 Abdominal aortic aneurysm, without rupture, unspecified: Secondary | ICD-10-CM

## 2017-11-06 NOTE — Patient Instructions (Signed)
Abdominal Aortic Aneurysm Blood pumps away from the heart through tubes (blood vessels) called arteries. Aneurysms are weak or damaged places in the wall of an artery. It bulges out like a balloon. An abdominal aortic aneurysm happens in the main artery of the body (aorta). It can burst or tear, causing bleeding inside the body. This is an emergency. It needs treatment right away. What are the causes? The exact cause is unknown. Things that could cause this problem include:  Fat and other substances building up in the lining of a tube.  Swelling of the walls of a blood vessel.  Certain tissue diseases.  Belly (abdominal) trauma.  An infection in the main artery of the body.  What increases the risk? There are things that make it more likely for you to have an aneurysm. These include:  Being over the age of 79 years old.  Having high blood pressure (hypertension).  Being a male.  Being white.  Being very overweight (obese).  Having a family history of aneurysm.  Using tobacco products.  What are the signs or symptoms? Symptoms depend on the size of the aneurysm and how fast it grows. There may not be symptoms. If symptoms occur, they can include:  Pain (belly, side, lower back, or groin).  Feeling full after eating a small amount of food.  Feeling sick to your stomach (nauseous), throwing up (vomiting), or both.  Feeling a lump in your belly that feels like it is beating (pulsating).  Feeling like you will pass out (faint).  How is this treated?  Medicine to control blood pressure and pain.  Imaging tests to see if the aneurysm gets bigger.  Surgery. How is this prevented? To lessen your chance of getting this condition:  Stop smoking. Stop chewing tobacco.  Limit or avoid alcohol.  Keep your blood pressure, blood sugar, and cholesterol within normal limits.  Eat less salt.  Eat foods low in saturated fats and cholesterol. These are found in animal and  whole dairy products.  Eat more fiber. Fiber is found in whole grains, vegetables, and fruits.  Keep a healthy weight.  Stay active and exercise often.  This information is not intended to replace advice given to you by your health care provider. Make sure you discuss any questions you have with your health care provider. Document Released: 06/07/2012 Document Revised: 07/19/2015 Document Reviewed: 03/12/2012 Elsevier Interactive Patient Education  2017 [employer] Inc.  

## 2017-11-06 NOTE — Assessment & Plan Note (Signed)
Aortic duplex today shows no change in the size of his abdominal aortic aneurysm measuring approximately 3.7 cm in maximal diameter.  No surgery or intervention at this time. The patient has an asymptomatic abdominal aortic aneurysm that is less than 4 cm in maximal diameter.  I have discussed the natural history of abdominal aortic aneurysm and the small risk of rupture for aneurysm less than 5 cm in size.  However, as these small aneurysms tend to enlarge over time, continued surveillance with ultrasound or CT scan is mandatory.  I have also discussed optimizing medical management with hypertension and lipid control and the importance of abstinence from tobacco.  The patient is also encouraged to exercise a minimum of 30 minutes 4 times a week.  Should the patient develop new onset abdominal or back pain or signs of peripheral embolization they are instructed to seek medical attention immediately and to alert the physician providing care that they have an aneurysm.  The patient voices their understanding. The patient will return in 12 months with an aortic duplex.

## 2017-11-06 NOTE — Progress Notes (Signed)
MRN : [id number]  Robert Weeks is a 77 y.o. ([birth year]) male who presents with chief complaint of  Chief Complaint  Patient presents with  . Follow-up    1 year aortic U/S follow up  .  History of Present Illness: Patient returns today in follow up of his abdominal aortic aneurysm.  He is doing well and has no specific complaints today.  He denies any aneurysm related symptoms. Specifically, the patient denies new back or abdominal pain, or signs of peripheral embolization Aortic duplex today shows no change in the size of his abdominal aortic aneurysm measuring approximately 3.7 cm in maximal diameter.  Current Outpatient Medications  Medication Sig Dispense Refill  . ADVAIR DISKUS 250-50 MCG/DOSE AEPB Inhale 1 puff into the lungs 2 (two) times daily.     Marland Kitchen albuterol (PROVENTIL HFA;VENTOLIN HFA) 108 (90 Base) MCG/ACT inhaler Inhale 2 puffs into the lungs every 6 (six) hours as needed.     Marland Kitchen allopurinol (ZYLOPRIM) 100 MG tablet Take by mouth.    . lamoTRIgine (LAMICTAL) 25 MG tablet Take 50 mg by mouth 2 (two) times daily.     Marland Kitchen levETIRAcetam (KEPPRA) 750 MG tablet Take 750 mg by mouth 2 (two) times daily.     Marland Kitchen lisinopril (PRINIVIL,ZESTRIL) 5 MG tablet Take 5 mg by mouth daily.     . potassium chloride SA (K-DUR,KLOR-CON) 20 MEQ tablet Take 20 mEq by mouth daily.    . sildenafil (REVATIO) 20 MG tablet Take 3 to 5 tablets two hours before intercouse on an empty stomach.  Do not take with nitrates. (Patient not taking: Reported on 11/06/2017) 50 tablet 3   No current facility-administered medications for this visit.     Past Medical History:  Diagnosis Date  . Asthma   . BPH (benign prostatic hyperplasia)   . Cancer [facility])    lymphom dx in 2011 in remission  . COPD (chronic obstructive pulmonary disease) ([place])   . Elevated PSA   . Erectile dysfunction   . Gout   . Hypertension   . Lymphoma ([place])   . Seizures ([place])     Past Surgical History:  Procedure Laterality Date    . COLONOSCOPY WITH PROPOFOL N/A 08/25/2014   Procedure: COLONOSCOPY WITH PROPOFOL;  Surgeon: Hulen Luster, MD;  Location: [facility] ENDOSCOPY;  Service: Gastroenterology;  Laterality: N/A;  . CYSTOSCOPY    . lymphectomy      Social History  Substance Use Topics  . Smoking status: Former Smoker    Quit date: 05/01/2009  . Smokeless tobacco: Current User    Types: Snuff  . Alcohol use 0.0 oz/week       Family History      Family History  Problem Relation Age of Onset  . Kidney disease Father 56  . Prostate cancer Neg Hx   . Bladder Cancer Neg Hx          Allergies  Allergen Reactions  . Finasteride      REVIEW OF SYSTEMS (Negative unless checked)  Constitutional: [] Weight loss  [] Fever  [] Chills Cardiac: [] Chest pain   [] Chest pressure   [] Palpitations   [] Shortness of breath when laying flat   [] Shortness of breath at rest   [] Shortness of breath with exertion. Vascular:  [] Pain in legs with walking   [] Pain in legs at rest   [] Pain in legs when laying flat   [] Claudication   [] Pain in feet when walking  [] Pain in feet at rest  [] Pain in  feet when laying flat   [] History of DVT   [] Phlebitis   [] Swelling in legs   [] Varicose veins   [] Non-healing ulcers Pulmonary:   [] Uses home oxygen   [] Productive cough   [] Hemoptysis   [] Wheeze  [] COPD   [] Asthma Neurologic:  [] Dizziness  [] Blackouts   [] Seizures   [] History of stroke   [] History of TIA  [] Aphasia   [] Temporary blindness   [] Dysphagia   [] Weakness or numbness in arms   [] Weakness or numbness in legs Musculoskeletal:  [x] Arthritis   [] Joint swelling   [] Joint pain   [] Low back pain Hematologic:  [] Easy bruising  [] Easy bleeding   [] Hypercoagulable state   [] Anemic   Gastrointestinal:  [] Blood in stool   [] Vomiting blood  [] Gastroesophageal reflux/heartburn   [] Abdominal pain Genitourinary:  [] Chronic kidney disease   [x] Difficult urination  [x] Frequent urination  [] Burning with urination   [] Hematuria Skin:   [] Rashes   [] Ulcers   [] Wounds Psychological:  [] History of anxiety   []  History of major depression.    Physical Examination  BP 112/72 (BP Location: Left Arm)   Pulse 81   Resp 16   Ht 5\' 10"  (1.778 m)   Wt 151 lb (68.5 kg)   BMI 21.67 kg/m  Gen:  WD/WN, NAD. Appears younger than stated age. Head: [place]/AT, No temporalis wasting. Ear/Nose/Throat: Hearing grossly intact, nares w/o erythema or drainage Eyes: Conjunctiva clear. Sclera non-icteric Neck: Supple.  Trachea midline Pulmonary:  Good air movement, no use of accessory muscles.  Cardiac: RRR, no JVD Vascular:  Vessel Right Left  Radial Palpable Palpable                                   Gastrointestinal: soft, non-tender/non-distended. No aortic tenderness Musculoskeletal: M/S 5/5 throughout.  No deformity or atrophy. No edema. Neurologic: Sensation grossly intact in extremities.  Symmetrical.  Speech is fluent.  Psychiatric: Judgment intact, Mood & affect appropriate for pt's clinical situation. Dermatologic: No rashes or ulcers noted.  No cellulitis or open wounds.       Labs No results found for this or any previous visit (from the past 2160 hour(s)).  Radiology No results found.  Assessment/Plan Benign essential HTN blood pressure control important in reducing the progression of atherosclerotic disease and aneurysmal disease. On appropriate oral medications.  AAA (abdominal aortic aneurysm) without rupture (HCC) Aortic duplex today shows no change in the size of his abdominal aortic aneurysm measuring approximately 3.7 cm in maximal diameter.  No surgery or intervention at this time. The patient has an asymptomatic abdominal aortic aneurysm that is less than 4 cm in maximal diameter.  I have discussed the natural history of abdominal aortic aneurysm and the small risk of rupture for aneurysm less than 5 cm in size.  However, as these small aneurysms tend to enlarge over time, continued surveillance  with ultrasound or CT scan is mandatory.  I have also discussed optimizing medical management with hypertension and lipid control and the importance of abstinence from tobacco.  The patient is also encouraged to exercise a minimum of 30 minutes 4 times a week.  Should the patient develop new onset abdominal or back pain or signs of peripheral embolization they are instructed to seek medical attention immediately and to alert the physician providing care that they have an aneurysm.  The patient voices their understanding. The patient will return in 12 months with an aortic duplex.  Leotis Pain, MD  11/06/2017 9:17 AM    This note was created with Dragon medical transcription system.  Any errors from dictation are purely unintentional

## 2017-11-11 DIAGNOSIS — R251 Tremor, unspecified: Secondary | ICD-10-CM | POA: Insufficient documentation

## 2018-01-14 ENCOUNTER — Telehealth: Payer: Self-pay | Admitting: *Deleted

## 2018-01-14 NOTE — Telephone Encounter (Signed)
Per [name] 01/14/18 staff message to cancel the PET for tomorrow. We will rs once we get the approval.  PET was cancelled as requested. I called patient and made him aware that the PET has been cancelled  And will be R/S once PET is approved.

## 2018-01-19 ENCOUNTER — Other Ambulatory Visit: Payer: Self-pay

## 2018-01-19 ENCOUNTER — Inpatient Hospital Stay (HOSPITAL_BASED_OUTPATIENT_CLINIC_OR_DEPARTMENT_OTHER): Payer: Medicare Other | Admitting: Hematology and Oncology

## 2018-01-19 ENCOUNTER — Inpatient Hospital Stay: Payer: Medicare Other | Attending: Hematology and Oncology

## 2018-01-19 ENCOUNTER — Encounter: Payer: Self-pay | Admitting: Hematology and Oncology

## 2018-01-19 VITALS — BP 139/81 | HR 85 | Temp 98.1°F | Resp 18 | Wt 150.1 lb

## 2018-01-19 DIAGNOSIS — C8228 Follicular lymphoma grade III, unspecified, lymph nodes of multiple sites: Secondary | ICD-10-CM

## 2018-01-19 DIAGNOSIS — E538 Deficiency of other specified B group vitamins: Secondary | ICD-10-CM

## 2018-01-19 LAB — CBC WITH DIFFERENTIAL/PLATELET
Abs Immature Granulocytes: 0.03 10*3/uL (ref 0.00–0.07)
Basophils Absolute: 0 10*3/uL (ref 0.0–0.1)
Basophils Relative: 0 %
Eosinophils Absolute: 0.1 10*3/uL (ref 0.0–0.5)
Eosinophils Relative: 1 %
HCT: 39.5 % (ref 39.0–52.0)
Hemoglobin: 13.6 g/dL (ref 13.0–17.0)
Immature Granulocytes: 0 %
Lymphocytes Relative: 23 %
Lymphs Abs: 1.9 10*3/uL (ref 0.7–4.0)
MCH: 31.9 pg (ref 26.0–34.0)
MCHC: 34.4 g/dL (ref 30.0–36.0)
MCV: 92.7 fL (ref 80.0–100.0)
Monocytes Absolute: 0.7 10*3/uL (ref 0.1–1.0)
Monocytes Relative: 9 %
Neutro Abs: 5.6 10*3/uL (ref 1.7–7.7)
Neutrophils Relative %: 67 %
Platelets: 173 10*3/uL (ref 150–400)
RBC: 4.26 MIL/uL (ref 4.22–5.81)
RDW: 12.5 % (ref 11.5–15.5)
WBC: 8.4 10*3/uL (ref 4.0–10.5)
nRBC: 0 % (ref 0.0–0.2)

## 2018-01-19 LAB — COMPREHENSIVE METABOLIC PANEL
ALT: 30 U/L (ref 0–44)
AST: 31 U/L (ref 15–41)
Albumin: 4.1 g/dL (ref 3.5–5.0)
Alkaline Phosphatase: 64 U/L (ref 38–126)
Anion gap: 5 (ref 5–15)
BUN: 18 mg/dL (ref 8–23)
CO2: 23 mmol/L (ref 22–32)
Calcium: 9.5 mg/dL (ref 8.9–10.3)
Chloride: 109 mmol/L (ref 98–111)
Creatinine, Ser: 1.64 mg/dL — ABNORMAL HIGH (ref 0.61–1.24)
GFR calc Af Amer: 45 mL/min — ABNORMAL LOW (ref >60)
GFR calc non Af Amer: 39 mL/min — ABNORMAL LOW (ref >60)
Glucose, Bld: 92 mg/dL (ref 70–99)
Potassium: 4.7 mmol/L (ref 3.5–5.1)
Sodium: 137 mmol/L (ref 135–145)
Total Bilirubin: 1.1 mg/dL (ref 0.3–1.2)
Total Protein: 6.9 g/dL (ref 6.5–8.1)

## 2018-01-19 LAB — URIC ACID: Uric Acid, Serum: 6.2 mg/dL (ref 3.7–8.6)

## 2018-01-19 LAB — LACTATE DEHYDROGENASE: LDH: 125 U/L (ref 98–192)

## 2018-01-19 NOTE — Progress Notes (Signed)
Here for follow up. Per pt" I feel pretty good " no weakness/ no voiced c/o per pt

## 2018-01-19 NOTE — Progress Notes (Signed)
[facility] day:  01/19/18   Chief Complaint: Robert Weeks is a 77 y.o. 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  HPI:   The patient was last seen in the medical oncology clinic on 07/17/2017.  At that time, he denied any B symptoms. Exam revealed no adenopathy or hepatosplenomegaly.  Creatinine was elevated at 1.62.  CBC was normal.  B12 was 1252 and folate 6.5.  During the interim, patient has been doing well. He denies any acute concerns. Patient has not experienced any B symptoms or recent infections. Patient denies any new areas of palpable adenopathy. No bruising or bleeding.   Patient continues to have a hoarse voice quality. Symptom persistent for over a year. Patient has been seen by ENT Tami Ribas, MD) on two separate occassions. He was referred to speech therapy, however patient has not pursued evaluation. 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   Patient advises that he maintains an adequate appetite. He is eating well. Weight today is 150 lb 1.6 oz (68.1 kg), which compared to his last visit to the clinic, represents a 4 pound decrease.    Patient denies pain in the clinic today.   Past Medical History:  Diagnosis Date  . Asthma   . BPH (benign prostatic hyperplasia)   . Cancer [facility])    lymphom dx in 2011 in remission  . COPD (chronic obstructive pulmonary disease) ([place])   . Elevated PSA   . Erectile dysfunction   . Gout   . Hypertension   . Lymphoma ([place])   . Seizures ([place])     Past Surgical History:  Procedure Laterality Date  . COLONOSCOPY WITH PROPOFOL N/A 08/25/2014   Procedure: COLONOSCOPY WITH PROPOFOL;  Surgeon: Hulen Luster, MD;  Location: [facility] ENDOSCOPY;  Service: Gastroenterology;  Laterality: N/A;  . CYSTOSCOPY    . lymphectomy      Family History  Problem Relation Age of Onset  .  Kidney disease Father 62  . Prostate cancer Neg Hx   . Bladder Cancer Neg Hx     Social History:  reports that he quit smoking about 8 years ago. His smokeless tobacco use includes snuff. He reports that he drinks alcohol. He reports that he does not use drugs.  The patient is alone today.  Allergies:  Allergies  Allergen Reactions  . Finasteride     Current Medications: Current Outpatient Medications  Medication Sig Dispense Refill  . ADVAIR DISKUS 250-50 MCG/DOSE AEPB Inhale 1 puff into the lungs 2 (two) times daily.     . Cyanocobalamin (VITAMIN B 12 PO) Take by mouth every morning.    . lamoTRIgine (LAMICTAL) 25 MG tablet Take 50 mg by mouth 2 (two) times daily.     Marland Kitchen levETIRAcetam (KEPPRA) 750 MG tablet Take 750 mg by mouth 2 (two) times daily.     Marland Kitchen lisinopril (PRINIVIL,ZESTRIL) 5 MG tablet Take 5 mg by mouth daily.     . potassium chloride SA (K-DUR,KLOR-CON) 20 MEQ tablet Take 20 mEq by mouth daily.    Marland Kitchen albuterol (PROVENTIL HFA;VENTOLIN HFA) 108 (90 Base) MCG/ACT inhaler Inhale 2 puffs into the lungs every 6 (six) hours as needed.     Marland Kitchen allopurinol (ZYLOPRIM) 100 MG tablet Take by mouth.    . sildenafil (REVATIO) 20 MG tablet Take 3 to 5 tablets  two hours before intercouse on an empty stomach.  Do not take with nitrates. (Patient not taking: Reported on 01/19/2018) 50 tablet 3   No current facility-administered medications for this visit.     Review of Systems  Constitutional: Positive for weight loss (4 pounds). Negative for chills, diaphoresis, fever and malaise/fatigue.       Doing "good".  HENT: Negative for congestion, ear discharge, ear pain, nosebleeds, sinus pain and sore throat.        Hoarse voice quality (seen by ENT).  Eyes: Negative.  Negative for blurred vision, double vision and photophobia.  Respiratory: Negative.  Negative for cough, sputum production and shortness of breath (chronic; related to COPD).   Cardiovascular: Negative.  Negative for chest pain,  palpitations, orthopnea, leg swelling and PND.  Gastrointestinal: Negative.  Negative for abdominal pain, blood in stool, constipation, diarrhea, melena, nausea and vomiting.  Genitourinary: Negative.  Negative for dysuria, frequency, hematuria and urgency.       BPH.  Musculoskeletal: Negative.  Negative for back pain, falls, joint pain, myalgias and neck pain.  Skin: Negative.  Negative for itching and rash.  Neurological: Negative.  Negative for dizziness, tremors, focal weakness, weakness and headaches.  Endo/Heme/Allergies: Negative.  Does not bruise/bleed easily.  Psychiatric/Behavioral: Negative for depression and memory loss. The patient is not nervous/anxious and does not have insomnia.   All other systems reviewed and are negative.  Performance status (ECOG): 1   Physical Exam: Blood pressure 139/81, pulse 85, temperature 98.1 F (36.7 C), temperature source Tympanic, resp. rate 18, weight 150 lb 1.6 oz (68.1 kg). GENERAL:  Thin gentleman sitting comfortably in the exam room in no acute distress. MENTAL STATUS:  Alert and oriented to person, place and time. HEAD:  Short gray hair and beard.  Normocephalic, atraumatic, face symmetric, no Cushingoid features. EYES:  Glasses.  Brown eyes.  Pupils equal round and reactive to light and accomodation.  No conjunctivitis or scleral icterus. ENT:  Hoarse.  Oropharynx clear without lesion.  Tongue normal. Mucous membranes moist.  RESPIRATORY:  Clear to auscultation without rales, wheezes or rhonchi. CARDIOVASCULAR:  Regular rate and rhythm without murmur, rub or gallop. ABDOMEN:  Soft, non-tender, with active bowel sounds, and no hepatosplenomegaly.  No masses. SKIN:  No rashes, ulcers or lesions. EXTREMITIES: No edema, no skin discoloration or tenderness.  No palpable cords. LYMPH NODES: No palpable cervical, supraclavicular, axillary or inguinal adenopathy  NEUROLOGICAL: Unremarkable. PSYCH:  Appropriate.    Imaging  studies: 09/10/2012:  PET scan revealed stable nodes.   07/10/2014:  Abdomen and pelvis CT revealed mesenteric root and retroperitoneal stranding without overt nodularity characteristic of treated lymphoma and stable from prior exams. There was wall thickening at the anal rectal junction. 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 07/10/2015:  Chest, abdomen, and pelvic CT revealed stable abnormal soft tissue along the celiac axis, SMA, and mesenteric root/jejunal mesentery, suggesting treated lymphoma.  There was no suspicious lymphadenopathy.  He has a 3.8 cm infrarenal abdominal aortic aneurysm.  07/08/2016:  Chest, abdomen, and pelvic CT revealed evidence of treated lymphoma in the chest and abdomen.  There were no findings to suggest recurrent disease.    Clinical Support on 01/19/2018  Component Date Value Ref Range Status  . Uric Acid, Serum 01/19/2018 6.2  3.7 - 8.6 mg/dL Final   Performed at [facility], [address] [place], [place] [address]  . LDH 01/19/2018 125  98 - 192 U/L Final  Performed at [facility], [address], [place], [place] [address]  . Sodium 01/19/2018 137  135 - 145 mmol/L Final  . Potassium 01/19/2018 4.7  3.5 - 5.1 mmol/L Final  . Chloride 01/19/2018 109  98 - 111 mmol/L Final  . CO2 01/19/2018 23  22 - 32 mmol/L Final  . Glucose, Bld 01/19/2018 92  70 - 99 mg/dL Final  . BUN 01/19/2018 18  8 - 23 mg/dL Final  . Creatinine, Ser 01/19/2018 1.64* 0.61 - 1.24 mg/dL Final  . Calcium 01/19/2018 9.5  8.9 - 10.3 mg/dL Final  . Total Protein 01/19/2018 6.9  6.5 - 8.1 g/dL Final  . Albumin 01/19/2018 4.1  3.5 - 5.0 g/dL Final  . AST 01/19/2018 31  15 - 41 U/L Final  . ALT 01/19/2018 30  0 - 44 U/L Final  . Alkaline Phosphatase 01/19/2018 64  38 - 126 U/L Final  . Total Bilirubin 01/19/2018 1.1  0.3 - 1.2 mg/dL Final  . GFR calc non Af Amer 01/19/2018 39* >60 mL/min Final  . GFR calc Af Amer 01/19/2018 45* >60 mL/min Final   . Anion gap 01/19/2018 5  5 - 15 Final   Performed at [facility], [address] [place], [place] [address]  . WBC 01/19/2018 8.4  4.0 - 10.5 K/uL Final  . RBC 01/19/2018 4.26  4.22 - 5.81 MIL/uL Final  . Hemoglobin 01/19/2018 13.6  13.0 - 17.0 g/dL Final  . HCT 01/19/2018 39.5  39.0 - 52.0 % Final  . MCV 01/19/2018 92.7  80.0 - 100.0 fL Final  . MCH 01/19/2018 31.9  26.0 - 34.0 pg Final  . MCHC 01/19/2018 34.4  30.0 - 36.0 g/dL Final  . RDW 01/19/2018 12.5  11.5 - 15.5 % Final  . Platelets 01/19/2018 173  150 - 400 K/uL Final  . nRBC 01/19/2018 0.0  0.0 - 0.2 % Final  . Neutrophils Relative % 01/19/2018 67  % Final  . Neutro Abs 01/19/2018 5.6  1.7 - 7.7 K/uL Final  . Lymphocytes Relative 01/19/2018 23  % Final  . Lymphs Abs 01/19/2018 1.9  0.7 - 4.0 K/uL Final  . Monocytes Relative 01/19/2018 9  % Final  . Monocytes Absolute 01/19/2018 0.7  0.1 - 1.0 K/uL Final  . Eosinophils Relative 01/19/2018 1  % Final  . Eosinophils Absolute 01/19/2018 0.1  0.0 - 0.5 K/uL Final  . Basophils Relative 01/19/2018 0  % Final  . Basophils Absolute 01/19/2018 0.0  0.0 - 0.1 K/uL Final  . Immature Granulocytes 01/19/2018 0  % Final  . Abs Immature Granulocytes 01/19/2018 0.03  0.00 - 0.07 K/uL Final   Performed at [facility], [address] [place],  [address]    Assessment:  Robert Weeks is a 77 y.o. 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  He presented with a right sided pleural effusion, retoperitoneal, and axillary adenopathy in 03/2009.  Pleural fluid on 03/30/2009 suggested the presence of a lymphoma of follicle center cell origin. Cytology was inconclusive.  Bone marrow biopsy on 04/04/2009 was negative.  Left axillary biopsy on 04/17/2009 were consistent with diffuse large B cell lymphoma and follicular lymphoma, grade 3.    He received 8 cycles of RCHOP from 04/24/2009- 09/26/2009.  He tolerated his  chemotherapy well. He received maintainance Rituxan every 2 months beginning 12/12/09 until 03/19/2011 (9 of 12 planned treatments).    PET scan on 09/10/2012 revealed  stable nodes.  Chest, abdomen, and pelvic CT on 07/09/2016 revealed evidence of treated lymphoma in the chest and abdomen.  There were no findings to suggest recurrent disease.   He underwent colonoscopy on 08/25/2014. There was a medium polyp in the ascending colon (tubular adenoma).  There was diverticulosis  in the sigmoid and ascending colon.  Random colon biopsies were negative.  He has chronic intermittent diarrhea.  He denies any melena or hematochezia.  He has B12 deficiency.  B12 was 141 (low) on 01/19/2017 and 1252 on 07/17/2017.  He began oral B12 in 04/2017.  Parietal cell antibody and intrinsic factor antibody was negative on 07/17/2017.  Symptomatically, he is doing well.  Voice remains hoarse.  Exam is unremarkable.  Creatinine is elevated at 1.64.  Plan: 1.  Labs today:  CBC with diff, CMP, LDH, uric acid. 2.  Stage IV follicular lymphoma with diffuse large B cell lymphoma:  Discuss denial of PET scan.  Discuss imaging if any concerns (symptoms or abnormal physical exam). 3.  B12 deficiency:  Continue oral B12. 4.  RTC in 6 months for MD assessment, labs (CBC with diff, CMP, LDH, uric acid, B12).   Honor Loh, NP  01/19/2018, 12:30 PM   I saw and evaluated the patient, participating in the key portions of the service and reviewing pertinent diagnostic studies and records.  I reviewed the nurse practitioner's note and agree with the findings and the plan.  The assessment and plan were discussed with the patient.  Several questions were asked by the patient and answered.   Nolon Stalls, MD 01/19/2018,12:30 PM

## 2018-01-25 ENCOUNTER — Ambulatory Visit: Payer: Self-pay | Admitting: Urology

## 2018-01-26 ENCOUNTER — Encounter: Payer: Self-pay | Admitting: Urology

## 2018-01-26 ENCOUNTER — Ambulatory Visit: Payer: Medicare Other | Admitting: Urology

## 2018-01-26 VITALS — BP 128/87 | HR 78 | Ht 69.0 in | Wt 151.3 lb

## 2018-01-26 DIAGNOSIS — N138 Other obstructive and reflux uropathy: Secondary | ICD-10-CM

## 2018-01-26 DIAGNOSIS — N529 Male erectile dysfunction, unspecified: Secondary | ICD-10-CM | POA: Diagnosis not present

## 2018-01-26 DIAGNOSIS — N401 Enlarged prostate with lower urinary tract symptoms: Secondary | ICD-10-CM | POA: Diagnosis not present

## 2018-01-26 DIAGNOSIS — N5082 Scrotal pain: Secondary | ICD-10-CM

## 2018-01-26 NOTE — Progress Notes (Signed)
01/26/2018 1:46 PM   Robert Weeks [birth year] [id number]  Referring provider: Leonel Ramsay, MD [place], [place] [address]  Chief Complaint  Patient presents with  . Follow-up  . Benign Prostatic Hypertrophy    HPI: Patient is a 77 year old [place] American male with erectile dysfunction and BPH with LUTS who presents today for his 12 month follow up.  BPH WITH LUTS His IPSS score today is 1, which is mild lower urinary tract symptomatology. He is mostly satisfied with his quality life due to his urinary symptoms.  His previous IPSS score was 0/0.  His complaint today is nocturia x 1-2.  He denies any dysuria, hematuria or suprapubic pain.   He was on finasteride in the past, but he discontinued the medication due to dizzy spells.  He also denies any recent fevers, chills, nausea or vomiting.  He does not have a family history of PCa.  He states about once a month he has a scrotal ache that lasts less than a day.  It is not associated with scrotal swelling and he has not felt any lumps or boils.    IPSS    Row Name 01/26/18 1300         International Prostate Symptom Score   How often have you had the sensation of not emptying your bladder?  Not at All     How often have you had to urinate less than every two hours?  Not at All     How often have you found you stopped and started again several times when you urinated?  Not at All     How often have you found it difficult to postpone urination?  Not at All     How often have you had a weak urinary stream?  Not at All     How often have you had to strain to start urination?  Not at All     How many times did you typically get up at night to urinate?  1 Time     Total IPSS Score  1       Quality of Life due to urinary symptoms   If you were to spend the rest of your life with your urinary condition just the way it is now how would you feel about that?  Mostly Satisfied        Score:  1-7  Mild 8-19 Moderate 20-35 Severe   PMH: Past Medical History:  Diagnosis Date  . Asthma   . BPH (benign prostatic hyperplasia)   . Cancer [facility])    lymphom dx in 2011 in remission  . COPD (chronic obstructive pulmonary disease) ([place])   . Elevated PSA   . Erectile dysfunction   . Gout   . Hypertension   . Lymphoma ([place])   . Seizures [facility])     Surgical History: Past Surgical History:  Procedure Laterality Date  . COLONOSCOPY WITH PROPOFOL N/A 08/25/2014   Procedure: COLONOSCOPY WITH PROPOFOL;  Surgeon: Hulen Luster, MD;  Location: [facility] ENDOSCOPY;  Service: Gastroenterology;  Laterality: N/A;  . CYSTOSCOPY    . lymphectomy      Home Medications:  Allergies as of 01/26/2018      Reactions   Finasteride       Medication List        Accurate as of 01/26/18  1:46 PM. Always use your most recent med list.  ADVAIR DISKUS 250-50 MCG/DOSE Aepb Generic drug:  Fluticasone-Salmeterol Inhale 1 puff into the lungs 2 (two) times daily.   albuterol 108 (90 Base) MCG/ACT inhaler Commonly known as:  PROVENTIL HFA;VENTOLIN HFA Inhale 2 puffs into the lungs every 6 (six) hours as needed.   allopurinol 100 MG tablet Commonly known as:  ZYLOPRIM Take by mouth.   lamoTRIgine 25 MG tablet Commonly known as:  LAMICTAL Take 50 mg by mouth 2 (two) times daily.   levETIRAcetam 750 MG tablet Commonly known as:  KEPPRA Take 750 mg by mouth 2 (two) times daily.   lisinopril 5 MG tablet Commonly known as:  PRINIVIL,ZESTRIL Take 5 mg by mouth daily.   potassium chloride SA 20 MEQ tablet Commonly known as:  K-DUR,KLOR-CON Take 20 mEq by mouth daily.   sildenafil 20 MG tablet Commonly known as:  REVATIO Take 3 to 5 tablets two hours before intercouse on an empty stomach.  Do not take with nitrates.       Allergies:  Allergies  Allergen Reactions  . Finasteride     Family History: Family History  Problem Relation Age of Onset  . Kidney disease Father 37  . Prostate  cancer Neg Hx   . Bladder Cancer Neg Hx     Social History:  reports that he quit smoking about 8 years ago. His smokeless tobacco use includes snuff. He reports that he drinks alcohol. He reports that he does not use drugs.  ROS: UROLOGY Frequent Urination?: No Hard to postpone urination?: No Burning/pain with urination?: No Get up at night to urinate?: Yes Leakage of urine?: No Urine stream starts and stops?: No Trouble starting stream?: No Do you have to strain to urinate?: No Blood in urine?: No Urinary tract infection?: No Sexually transmitted disease?: No Injury to kidneys or bladder?: No Painful intercourse?: No Weak stream?: No Erection problems?: No Penile pain?: No  Gastrointestinal Nausea?: No Vomiting?: No Indigestion/heartburn?: No Diarrhea?: No Constipation?: No  Constitutional Fever: No Night sweats?: No Weight loss?: No Fatigue?: No  Skin Skin rash/lesions?: No Itching?: No  Eyes Blurred vision?: No Double vision?: No  Ears/Nose/Throat Sore throat?: No Sinus problems?: No  Hematologic/Lymphatic Swollen glands?: No Easy bruising?: No  Cardiovascular Leg swelling?: No Chest pain?: No  Respiratory Cough?: No Shortness of breath?: Yes  Endocrine Excessive thirst?: No  Musculoskeletal Back pain?: No Joint pain?: No  Neurological Headaches?: No Dizziness?: No  Psychologic Depression?: No Anxiety?: No  Physical Exam: BP 128/87 (BP Location: Left Arm, Patient Position: Sitting, Cuff Size: Normal)   Pulse 78   Ht 5\' 9"  (1.753 m)   Wt 151 lb 4.8 oz (68.6 kg)   BMI 22.34 kg/m   Constitutional: Well nourished. Alert and oriented, No acute distress. HEENT: [place] AT, moist mucus membranes. Trachea midline, no masses. Cardiovascular: No clubbing, cyanosis, or edema. Respiratory: Normal respiratory effort, no increased work of breathing. GI: Abdomen is soft, non tender, non distended, no abdominal masses. Liver and spleen not  palpable.  No hernias appreciated.  Stool sample for occult testing is not indicated.   GU: No CVA tenderness.  No bladder fullness or masses.  Patient with circumcised phallus.  Urethral meatus is patent.  No penile discharge. No penile lesions or rashes. Scrotum without lesions, cysts, rashes and/or edema.  Testicles are located scrotally bilaterally. No masses are appreciated in the testicles. Left and right epididymis are normal. Rectal: Patient with  normal sphincter tone. Anus and perineum without scarring or rashes. No rectal  masses are appreciated. Prostate is approximately 90 + grams, could only palpate the apex, no nodules are appreciated. Skin: No rashes, bruises or suspicious lesions. Lymph: No cervical or inguinal adenopathy. Neurologic: Grossly intact, no focal deficits, moving all 4 extremities. Psychiatric: Normal mood and affect.    Laboratory Data: Lab Results  Component Value Date   WBC 8.4 01/19/2018   HGB 13.6 01/19/2018   HCT 39.5 01/19/2018   MCV 92.7 01/19/2018   PLT 173 01/19/2018    Lab Results  Component Value Date   CREATININE 1.64 (H) 01/19/2018   PSA History  4.0 ng/mL on 04/06/2013  3.8 ng/mL on 10/12/2013  1.0 ng/mL on 04/14/2014 Lab Results  Component Value Date   PSA 3.1 06/25/2012   PSA 5.0 (H) 05/26/2011    I have reviewed the labs.   Assessment & Plan:    1. BPH with LU TS IPSS score is 1/2, it is stable He will return to clinic in one year for IPSS score and exam.    2. Erectile dysfunction Not interested in any treatment at this time.    3. Scrotal pain Exam benign Will contact us if symptoms worsen   Return in about 1 year (around 01/27/2019) for I PSS and exam .  Zara Council, [facility]  [place] [place], [place] [address] [phone]

## 2018-07-20 ENCOUNTER — Other Ambulatory Visit: Payer: Medicare Other

## 2018-07-20 ENCOUNTER — Ambulatory Visit: Payer: Medicare Other | Admitting: Hematology and Oncology

## 2018-08-20 ENCOUNTER — Other Ambulatory Visit: Payer: Medicare Other

## 2018-08-20 ENCOUNTER — Ambulatory Visit: Payer: Medicare Other | Admitting: Hematology and Oncology

## 2018-09-02 NOTE — Progress Notes (Signed)
[facility]  [address] [place], [place] [address] Phone: [phone]  Fax: [phone]   Clinic Day:  09/07/2018  Referring physician: Leonel Ramsay, MD  Chief Complaint: Robert Weeks is a 78 y.o. 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  HPI: The patient was last seen in the medical oncology clinic on 01/19/2018. At that time, he was doing well. Voice remained hoarse. Exam was unremarkable. Creatinine was elevated at 1.64.  He continued oral B12.   The patient had a follow up for benign prostatic hypertrophy (BPH) with Jimmye Norman in the urology clinic on 01/26/2018.  He also noted scotal pain.  Exam was stable.  He has follow-up in 1 year.  He is scheduled for a colonoscopy on 09/21/2018.   During the interim, the patient has been doing well. He reports his hoarse voice has been improving over the past two months. He previously saw ENT about 1 1/2 years ago.  He denies any fevers, sweats or weight loss.  His blood pressure is 165/80 in the clinic today. He denies any shortness of breath. He denies any chest pain.  He is still taking oral B12.   He notes getting some red spots on his skin that go away during the day, which he attributes to one of his medications. Patient was concerned about a "mole" on his upper left chest that he noticed about a 1 week ago. He notes he has been outside cutting grass.    Past Medical History:  Diagnosis Date  . Asthma   . BPH (benign prostatic hyperplasia)   . Cancer [facility])    lymphom dx in 2011 in remission  . COPD (chronic obstructive pulmonary disease) ([place])   . Elevated PSA   . Erectile dysfunction   . Gout   . Hypertension   . Lymphoma ([place])   . Seizures ([place])     Past Surgical History:  Procedure Laterality Date  . COLONOSCOPY WITH PROPOFOL N/A 08/25/2014   Procedure: COLONOSCOPY WITH PROPOFOL;  Surgeon: Hulen Luster, MD;   [facility] ENDOSCOPY;  Service: Gastroenterology;  Laterality: N/A;  . CYSTOSCOPY    . lymphectomy      Family History  Problem Relation Age of Onset  . Kidney disease Father 94  . Prostate cancer Neg Hx   . Bladder Cancer Neg Hx     Social History:  reports that he quit smoking about 9 years ago. His smokeless tobacco use includes snuff. He reports current alcohol use. He reports that he does not use drugs.  He lives in [place]. The patient is alone today.  Allergies:  Allergies  Allergen Reactions  . Finasteride     Current Medications: Current Outpatient Medications  Medication Sig Dispense Refill  . ADVAIR DISKUS 250-50 MCG/DOSE AEPB Inhale 1 puff into the lungs 2 (two) times daily.     Marland Kitchen albuterol (PROVENTIL HFA;VENTOLIN HFA) 108 (90 Base) MCG/ACT inhaler Inhale 2 puffs into the lungs every 6 (six) hours as needed.     . lamoTRIgine (LAMICTAL) 25 MG tablet Take 50 mg by mouth 2 (two) times daily.     Marland Kitchen levETIRAcetam (KEPPRA) 750 MG tablet Take 750 mg by mouth 2 (two) times daily.     Marland Kitchen lisinopril (PRINIVIL,ZESTRIL) 5 MG tablet Take 5 mg by mouth daily.     . potassium chloride SA (K-DUR,KLOR-CON) 20 MEQ tablet Take 20 mEq by mouth  daily.    . sildenafil (REVATIO) 20 MG tablet Take 3 to 5 tablets two hours before intercouse on an empty stomach.  Do not take with nitrates. 50 tablet 3   No current facility-administered medications for this visit.     Review of Systems  Constitutional: Negative for chills, diaphoresis, fever, malaise/fatigue and weight loss (stable).       Doing "good".  HENT: Negative for congestion, ear discharge, ear pain, nosebleeds, sinus pain and sore throat.        Hoarse voice quality (seen by ENT), improving.  Eyes: Negative.  Negative for blurred vision, double vision and photophobia.  Respiratory: Negative.  Negative for cough, sputum production and shortness of breath (chronic; related to COPD).   Cardiovascular: Negative.  Negative for  chest pain, palpitations, orthopnea, leg swelling and PND.  Gastrointestinal: Negative.  Negative for abdominal pain, blood in stool, constipation, diarrhea, melena, nausea and vomiting.  Genitourinary: Negative.  Negative for dysuria, frequency, hematuria and urgency.       BPH.  Musculoskeletal: Negative.  Negative for back pain, falls, joint pain, myalgias and neck pain.  Skin: Negative.  Negative for itching and rash.       Couple of transient red spots felt secondary to medications.  Spots fade during the day.  Neurological: Negative.  Negative for dizziness, tremors, focal weakness, weakness and headaches.  Endo/Heme/Allergies: Negative.  Does not bruise/bleed easily.  Psychiatric/Behavioral: Negative for depression and memory loss. The patient is not nervous/anxious and does not have insomnia.   All other systems reviewed and are negative.  Performance status (ECOG): 0-1  Vital Signs Blood pressure (!) 165/80, pulse 64, temperature 98 F (36.7 C), temperature source Tympanic, resp. rate 16, weight 150 lb 2.1 oz (68.1 kg), SpO2 99 %.  Physical Exam  Constitutional: He is oriented to person, place, and time. He appears well-developed and well-nourished. No distress.  HENT:  Head: Normocephalic and atraumatic.  Mouth/Throat: Oropharynx is clear and moist. No oropharyngeal exudate.  Short gray hair and beard.  Eyes: Pupils are equal, round, and reactive to light. Conjunctivae and EOM are normal. No scleral icterus.  Dark rimmed glasses. Brown eyes.  Neck: Normal range of motion. Neck supple.  Cardiovascular: Normal rate, regular rhythm and normal heart sounds.  No murmur heard. Pulmonary/Chest: Effort normal and breath sounds normal. No respiratory distress.  Tick on left upper chest with superior rim of erythema (see picture below).  Abdominal: Soft. Bowel sounds are normal. There is no abdominal tenderness.  Musculoskeletal: Normal range of motion.        General: No tenderness  or edema.  Lymphadenopathy:    He has no cervical adenopathy.    He has no axillary adenopathy.       Right: No supraclavicular adenopathy present.       Left: No supraclavicular adenopathy present.  Neurological: He is alert and oriented to person, place, and time. He has normal reflexes.  Skin: Skin is warm and dry. No rash noted. He is not diaphoretic. There is erythema (tick, left upper chest).  Psychiatric: He has a normal mood and affect. His behavior is normal. Judgment and thought content normal.  Nursing note and vitals reviewed.            Left upper chest   Imaging studies: 09/10/2012:  PET scan revealed stable nodes.   07/10/2014:  Abdomen and pelvis CT revealed mesenteric root and retroperitoneal stranding without overt nodularity characteristic of treated lymphoma and stable from prior  exams. There was wall thickening at the anal rectal junction. 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 07/10/2015:  Chest, abdomen, and pelvic CT revealed stable abnormal soft tissue along the celiac axis, SMA, and mesenteric root/jejunal mesentery, suggesting treated lymphoma.  There was no suspicious lymphadenopathy.  He has a 3.8 cm infrarenal abdominal aortic aneurysm.  07/08/2016:  Chest, abdomen, and pelvic CT revealed evidence of treated lymphoma in the chest and abdomen.  There were no findings to suggest recurrent disease.    Appointment on 09/07/2018  Component Date Value Ref Range Status  . Uric Acid, Serum 09/07/2018 5.9  3.7 - 8.6 mg/dL Final   Performed at [facility], [address] [place], [place] [address]  . LDH 09/07/2018 144  98 - 192 U/L Final   Performed at [facility], [address] [place], [place] [address]  . Sodium 09/07/2018 139  135 - 145 mmol/L Final  . Potassium 09/07/2018 3.8  3.5 - 5.1 mmol/L Final  . Chloride 09/07/2018 107  98 - 111 mmol/L Final  . CO2 09/07/2018 23  22 - 32 mmol/L Final  . Glucose, Bld  09/07/2018 104* 70 - 99 mg/dL Final  . BUN 09/07/2018 14  8 - 23 mg/dL Final  . Creatinine, Ser 09/07/2018 1.55* 0.61 - 1.24 mg/dL Final  . Calcium 09/07/2018 8.6* 8.9 - 10.3 mg/dL Final  . Total Protein 09/07/2018 6.8  6.5 - 8.1 g/dL Final  . Albumin 09/07/2018 4.0  3.5 - 5.0 g/dL Final  . AST 09/07/2018 24  15 - 41 U/L Final  . ALT 09/07/2018 22  0 - 44 U/L Final  . Alkaline Phosphatase 09/07/2018 59  38 - 126 U/L Final  . Total Bilirubin 09/07/2018 1.0  0.3 - 1.2 mg/dL Final  . GFR calc non Af Amer 09/07/2018 42* >60 mL/min Final  . GFR calc Af Amer 09/07/2018 49* >60 mL/min Final  . Anion gap 09/07/2018 9  5 - 15 Final   Performed at [facility], [address], [place], [place] [address]  . WBC 09/07/2018 7.8  4.0 - 10.5 K/uL Final  . RBC 09/07/2018 4.07* 4.22 - 5.81 MIL/uL Final  . Hemoglobin 09/07/2018 13.5  13.0 - 17.0 g/dL Final  . HCT 09/07/2018 38.6* 39.0 - 52.0 % Final  . MCV 09/07/2018 94.8  80.0 - 100.0 fL Final  . MCH 09/07/2018 33.2  26.0 - 34.0 pg Final  . MCHC 09/07/2018 35.0  30.0 - 36.0 g/dL Final  . RDW 09/07/2018 12.6  11.5 - 15.5 % Final  . Platelets 09/07/2018 155  150 - 400 K/uL Final  . nRBC 09/07/2018 0.0  0.0 - 0.2 % Final  . Neutrophils Relative % 09/07/2018 72  % Final  . Neutro Abs 09/07/2018 5.6  1.7 - 7.7 K/uL Final  . Lymphocytes Relative 09/07/2018 19  % Final  . Lymphs Abs 09/07/2018 1.5  0.7 - 4.0 K/uL Final  . Monocytes Relative 09/07/2018 8  % Final  . Monocytes Absolute 09/07/2018 0.6  0.1 - 1.0 K/uL Final  . Eosinophils Relative 09/07/2018 1  % Final  . Eosinophils Absolute 09/07/2018 0.1  0.0 - 0.5 K/uL Final  . Basophils Relative 09/07/2018 0  % Final  . Basophils Absolute 09/07/2018 0.0  0.0 - 0.1 K/uL Final  . Immature Granulocytes 09/07/2018 0  % Final  . Abs Immature Granulocytes 09/07/2018 0.03  0.00 - 0.07 K/uL Final   Performed at  [facility], [address], [place], [place] [address]    Assessment:   Robert Weeks is a 78 y.o. 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  He presented with a right sided pleural effusion, retoperitoneal, and axillary adenopathy in 03/2009.  Pleural fluid on 03/30/2009 suggested the presence of a lymphoma of follicle center cell origin. Cytology was inconclusive.  Bone marrow biopsy on 04/04/2009 was negative.  Left axillary biopsy on 04/17/2009 were consistent with diffuse large B cell lymphoma and follicular lymphoma, grade 3.    He received 8 cycles of RCHOP from 04/24/2009- 09/26/2009.  He tolerated his chemotherapy well. He received maintainance Rituxan every 2 months beginning 12/12/09 until 03/19/2011 (9 of 12 planned treatments).    PET scan on 09/10/2012 revealed stable nodes.  Chest, abdomen, and pelvic CT on 07/09/2016 revealed evidence of treated lymphoma in the chest and abdomen.  There were no findings to suggest recurrent disease.   He underwent colonoscopy on 08/25/2014. There was a medium polyp in the ascending colon (tubular adenoma).  There was diverticulosis  in the sigmoid and ascending colon.  Random colon biopsies were negative.  He has chronic intermittent diarrhea.  He denies any melena or hematochezia.  He has B12 deficiency.  B12 was 141 (low) on 01/19/2017 and 1252 on 07/17/2017.  He began oral B12 in 04/2017.  Parietal cell antibody and intrinsic factor antibody was negative on 07/17/2017.  Symptomatically, he is doing well.  He denies any fevers, sweats or weight loss.  Exam reveals no adenopathy or hepatosplenomegaly.  Plan: 1.   Labs today:  CBC with diff, CMP, LDH, uric acid, B12, folate. 2.   Stage IV follicular lymphoma with diffuse large B cell lymphoma             Clinically he is doing well.  CBC is normal.  Exam reveals no adenopathy or hepatosplenomegaly.  No plan for imaging unless concerning symptoms, labs, or exam. 3.   B12 deficiency             Patient continues oral B12.  Check  B12 and folate level today. 4.   Health maintenance  Patient scheduled for colonoscopy on 09/21/2018. 5.   Left chest wall tick  Exam felt to be consistent with tick (see photo).  Patient referred to Urgent Care in [place]. 6.   RTC in 6 months for MD assessment and labs (CBC with diff, CMp, LDH, uric acid).  I discussed the assessment and treatment plan with the patient.  The patient was provided an opportunity to ask questions and all were answered.  The patient agreed with the plan and demonstrated an understanding of the instructions.  The patient was advised to call back if the symptoms worsen or if the condition fails to improve as anticipated.   Lequita Asal, MD, PhD    09/07/2018, 11:52 AM  I, Selena Batten, am acting as scribe for [employer]. Mike Gip, MD, PhD.  I, Melissa C. Mike Gip, MD, have reviewed the above documentation for accuracy and completeness, and I agree with the above.

## 2018-09-07 ENCOUNTER — Inpatient Hospital Stay: Payer: Medicare Other | Attending: Hematology and Oncology

## 2018-09-07 ENCOUNTER — Ambulatory Visit
Admission: EM | Admit: 2018-09-07 | Discharge: 2018-09-07 | Disposition: A | Discharge status: Home / Self Care | Payer: Medicare Other | Attending: Urgent Care | Admitting: Urgent Care

## 2018-09-07 ENCOUNTER — Inpatient Hospital Stay (HOSPITAL_BASED_OUTPATIENT_CLINIC_OR_DEPARTMENT_OTHER): Payer: Medicare Other | Admitting: Hematology and Oncology

## 2018-09-07 ENCOUNTER — Other Ambulatory Visit: Payer: Self-pay | Admitting: Hematology and Oncology

## 2018-09-07 ENCOUNTER — Other Ambulatory Visit: Payer: Self-pay

## 2018-09-07 ENCOUNTER — Encounter: Payer: Self-pay | Admitting: Hematology and Oncology

## 2018-09-07 VITALS — BP 165/80 | HR 64 | Temp 98.0°F | Resp 16 | Wt 150.1 lb

## 2018-09-07 DIAGNOSIS — E538 Deficiency of other specified B group vitamins: Secondary | ICD-10-CM

## 2018-09-07 DIAGNOSIS — W57XXXA Bitten or stung by nonvenomous insect and other nonvenomous arthropods, initial encounter: Secondary | ICD-10-CM

## 2018-09-07 DIAGNOSIS — L539 Erythematous condition, unspecified: Secondary | ICD-10-CM | POA: Diagnosis not present

## 2018-09-07 DIAGNOSIS — S20369A Insect bite (nonvenomous) of unspecified front wall of thorax, initial encounter: Secondary | ICD-10-CM | POA: Diagnosis not present

## 2018-09-07 DIAGNOSIS — C8228 Follicular lymphoma grade III, unspecified, lymph nodes of multiple sites: Secondary | ICD-10-CM | POA: Insufficient documentation

## 2018-09-07 LAB — COMPREHENSIVE METABOLIC PANEL
ALT: 22 U/L (ref 0–44)
AST: 24 U/L (ref 15–41)
Albumin: 4 g/dL (ref 3.5–5.0)
Alkaline Phosphatase: 59 U/L (ref 38–126)
Anion gap: 9 (ref 5–15)
BUN: 14 mg/dL (ref 8–23)
CO2: 23 mmol/L (ref 22–32)
Calcium: 8.6 mg/dL — ABNORMAL LOW (ref 8.9–10.3)
Chloride: 107 mmol/L (ref 98–111)
Creatinine, Ser: 1.55 mg/dL — ABNORMAL HIGH (ref 0.61–1.24)
GFR calc Af Amer: 49 mL/min — ABNORMAL LOW (ref >60)
GFR calc non Af Amer: 42 mL/min — ABNORMAL LOW (ref >60)
Glucose, Bld: 104 mg/dL — ABNORMAL HIGH (ref 70–99)
Potassium: 3.8 mmol/L (ref 3.5–5.1)
Sodium: 139 mmol/L (ref 135–145)
Total Bilirubin: 1 mg/dL (ref 0.3–1.2)
Total Protein: 6.8 g/dL (ref 6.5–8.1)

## 2018-09-07 LAB — CBC WITH DIFFERENTIAL/PLATELET
Abs Immature Granulocytes: 0.03 10*3/uL (ref 0.00–0.07)
Basophils Absolute: 0 10*3/uL (ref 0.0–0.1)
Basophils Relative: 0 %
Eosinophils Absolute: 0.1 10*3/uL (ref 0.0–0.5)
Eosinophils Relative: 1 %
HCT: 38.6 % — ABNORMAL LOW (ref 39.0–52.0)
Hemoglobin: 13.5 g/dL (ref 13.0–17.0)
Immature Granulocytes: 0 %
Lymphocytes Relative: 19 %
Lymphs Abs: 1.5 10*3/uL (ref 0.7–4.0)
MCH: 33.2 pg (ref 26.0–34.0)
MCHC: 35 g/dL (ref 30.0–36.0)
MCV: 94.8 fL (ref 80.0–100.0)
Monocytes Absolute: 0.6 10*3/uL (ref 0.1–1.0)
Monocytes Relative: 8 %
Neutro Abs: 5.6 10*3/uL (ref 1.7–7.7)
Neutrophils Relative %: 72 %
Platelets: 155 10*3/uL (ref 150–400)
RBC: 4.07 MIL/uL — ABNORMAL LOW (ref 4.22–5.81)
RDW: 12.6 % (ref 11.5–15.5)
WBC: 7.8 10*3/uL (ref 4.0–10.5)
nRBC: 0 % (ref 0.0–0.2)

## 2018-09-07 LAB — FOLATE: Folate: 5.2 ng/mL — ABNORMAL LOW (ref >5.9)

## 2018-09-07 LAB — LACTATE DEHYDROGENASE: LDH: 144 U/L (ref 98–192)

## 2018-09-07 LAB — URIC ACID: Uric Acid, Serum: 5.9 mg/dL (ref 3.7–8.6)

## 2018-09-07 LAB — VITAMIN B12: Vitamin B-12: 1035 pg/mL — ABNORMAL HIGH (ref 180–914)

## 2018-09-07 MED ORDER — DOXYCYCLINE HYCLATE 100 MG PO CAPS: 100.0000 mg | ORAL_CAPSULE | Freq: Two times a day (BID) | ORAL | 0 refills | Status: DC

## 2018-09-07 NOTE — ED Triage Notes (Signed)
Patient currently has a tick embedded in to his left chest for the last week. Patient states that he went to see the cancer center today and they told him this was a tick and needed to have it removed, patient states that he thought this was a mole.

## 2018-09-07 NOTE — Discharge Instructions (Signed)
It was very nice seeing you today in clinic. Thank you for entrusting me with your care.  ° °Please utilize the medications that we discussed. Your prescriptions have been called in to your pharmacy.  ° °Make arrangements to follow up with your regular doctor in 1 week for re-evaluation if not improving. If your symptoms/condition worsens, please seek follow up care either here or in the ER. Please remember, our [place] providers are "right here with you" when you need us.  ° °Again, it was my pleasure to take care of you today. Thank you for choosing our clinic. I hope that you start to feel better quickly.  ° °Juley Giovanetti, MSN, APRN, FNP-C, CEN °Advanced Practice Provider °[facility] ° °

## 2018-09-07 NOTE — Progress Notes (Signed)
Pt here for follow up. Denies any concerns.  

## 2018-09-07 NOTE — ED Provider Notes (Signed)
[place], [place]   Name: Robert Weeks DOB: August 31, 1940 MRN: [id number] CSN: [id number] PCP: Baxter Hire, MD  Arrival date and time:  09/07/18 1243  Chief Complaint:  Tick Removal   NOTE: Prior to seeing the patient today, I have reviewed the triage nursing documentation and vital signs. Clinical staff has updated patient's PMH/PSHx, current medication list, and drug allergies/intolerances to ensure comprehensive history available to assist in medical decision making.   History:   HPI: Robert Weeks is a 78 y.o. male who presents today from the cancer center for evaluation of a tick bite. 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 Patient was found to have an engorged tick to his LEFT upper chest wall. Insect has been in place for at least a week. He works outside a lot and first appreciated the area after cutting grass.  Patient states, "I thought it was just a big mole that came up". Patient with associated erythematous rash to his anterior chest wall that is reported to be slightly pruritic. Despite prolonged contact time with the insect, the patient has not experienced any of the common symptoms associated with known tick borne illnesses. Patient denies myalgias, arthralgias, fatigue, weakness, and fevers.   Past Medical History:  Diagnosis Date  . Asthma   . BPH (benign prostatic hyperplasia)   . Cancer [facility])    lymphom dx in 2011 in remission  . COPD (chronic obstructive pulmonary disease) ([place])   . Elevated PSA   . Erectile dysfunction   . Gout   . Hypertension   . Lymphoma ([place])   . Seizures ([place])     Past Surgical History:  Procedure Laterality Date  . COLONOSCOPY WITH PROPOFOL N/A 08/25/2014   Procedure: COLONOSCOPY WITH PROPOFOL;  Surgeon: Hulen Luster, MD;  Location: [facility] ENDOSCOPY;  Service: Gastroenterology;  Laterality: N/A;  . CYSTOSCOPY    . lymphectomy       Family History  Problem Relation Age of Onset  . Kidney disease Father 33  . Prostate cancer Neg Hx   . Bladder Cancer Neg Hx     Social History   Tobacco Use  . Smoking status: Former Smoker    Quit date: 05/01/2009    Years since quitting: 9.3  . Smokeless tobacco: Current User    Types: Snuff  Substance Use Topics  . Alcohol use: Yes    Comment: 2 Drinks Weekly  . Drug use: No    Patient Active Problem List   Diagnosis Date Noted  . Tick bite 09/07/2018  . AAA (abdominal aortic aneurysm) without rupture ([place]) 10/31/2016  . Hoarse 07/15/2016  . 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 Psychosis in elderly with behavioral disturbance ([place]) 07/24/2015  . Involuntary commitment 07/24/2015  . BPH with obstruction/lower urinary tract symptoms 01/23/2015  . Erectile dysfunction of organic origin 01/23/2015  . B12 deficiency 01/05/2015  . Anemia 01/01/2015  . BP (high blood pressure) 08/02/2014  . Lymphoma ([place]) 08/02/2014  . 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 Seizure ([place]) 08/03/2013  . Mechanical and motor problems with internal organs 08/03/2013  . Episode of syncope 08/03/2013  . CAFL (chronic airflow limitation) ([place]) 06/01/2013  . Benign essential HTN 06/01/2013    Home Medications:    Current Meds  Medication Sig  . ADVAIR DISKUS 250-50 MCG/DOSE AEPB Inhale 1 puff into the lungs 2 (two)  times daily.   Marland Kitchen albuterol (PROVENTIL HFA;VENTOLIN HFA) 108 (90 Base) MCG/ACT inhaler Inhale 2 puffs into the lungs every 6 (six) hours as needed.   . lamoTRIgine (LAMICTAL) 25 MG tablet Take 50 mg by mouth 2 (two) times daily.   Marland Kitchen levETIRAcetam (KEPPRA) 750 MG tablet Take 750 mg by mouth 2 (two) times daily.   Marland Kitchen lisinopril (PRINIVIL,ZESTRIL) 5 MG tablet Take 5 mg by mouth daily.   . potassium chloride SA (K-DUR,KLOR-CON) 20 MEQ tablet Take 20 mEq by mouth daily.  . sildenafil (REVATIO) 20 MG tablet Take 3 to 5 tablets two hours before intercouse on an  empty stomach.  Do not take with nitrates.    Allergies:   Finasteride  Review of Systems (ROS): Review of Systems  Constitutional: Negative for chills and fever.  Respiratory: Positive for shortness of breath (chronic 2/2 COPD). Negative for cough.   Cardiovascular: Negative for chest pain and palpitations.  Gastrointestinal: Negative for diarrhea, nausea and vomiting.  Musculoskeletal: Negative for arthralgias, back pain, myalgias and neck pain.  Skin: Positive for color change and rash.  Neurological: Negative for dizziness, syncope, weakness, numbness and headaches.  Hematological: Negative for adenopathy.     Vital Signs: Today's Vitals   09/07/18 1347 09/07/18 1349 09/07/18 1408  BP:  (!) 158/84   Pulse:  (!) 59   Resp:  18   Temp:  97.9 F (36.6 C)   TempSrc:  Oral   SpO2:  98%   Weight: 150 lb (68 kg)    Height: 5\' 10"  (1.778 m)    PainSc: 0-No pain  0-No pain    Physical Exam: Physical Exam  Constitutional: He is oriented to person, place, and time and well-developed, well-nourished, and in no distress.  HENT:  Head: Normocephalic and atraumatic.  Mouth/Throat: Mucous membranes are normal.  Eyes: Pupils are equal, round, and reactive to light. EOM are normal.  Neck: Normal range of motion. Neck supple. No tracheal deviation present.  Cardiovascular: Normal rate, regular rhythm, normal heart sounds and intact distal pulses. Exam reveals no gallop and no friction rub.  No murmur heard. Pulmonary/Chest: Effort normal and breath sounds normal. No respiratory distress. He has no wheezes. He has no rales.  Lymphadenopathy:    He has no cervical adenopathy.  Neurological: He is alert and oriented to person, place, and time. Gait normal. GCS score is 15.  Skin: Skin is warm and dry. Rash noted.  Embedded tick to LEFT upper chest wall; engorged. (+) associated erythematous rash; pruritic. See medical photos below.   Psychiatric: Mood, memory, affect and judgment  normal.  Nursing note and vitals reviewed.     Urgent Care Treatments / Results:   LABS: PLEASE NOTE: all labs that were ordered this encounter are listed, however only abnormal results are displayed. Labs Reviewed - No data to display  EKG: -None  RADIOLOGY: No results found.  PROCEDURES: Procedures  MEDICATIONS RECEIVED THIS VISIT: Medications - No data to display  PERTINENT CLINICAL COURSE NOTES/UPDATES:   Initial Impression / Assessment and Plan / Urgent Care Course:  Pertinent labs & imaging results that were available during my care of the patient were personally reviewed by me and considered in my medical decision making (see lab/imaging section of note for values and interpretations).  Robert Weeks is a 78 y.o. male who presents to [facility] today with complaints of Tick Removal  Patient overall well appearing and in no acute distress today in clinic. Exam as per  above. Engorged insect removed; felt to be intact. Discussed potential for small retained portions. Patient asymptomatic; no fevers, myalgias, arthralgias, or headaches. No LAD. (+) erythematous rash to chest wall. Given DLBCL (immunocompromised) diagnosis, length of exposure, and engorged insect status, will pursue prophylactic treatment with a 10 day course of doxycycline. Patient to monitor site for signs and symptoms of infection, which would included increased redness, swelling, streaking, drainage, pain, and the development of a fever. May uses compresses to help with discomfort and rash. May use Tylenol and/or Ibuprofen as needed if any symptoms declare.    Discussed follow up with primary care physician in 1 week for re-evaluation. I have reviewed the follow up and strict return precautions for any new or worsening symptoms. Patient is aware of symptoms that would be deemed urgent/emergent, and would thus require further evaluation either here or in the emergency department. At the time of  discharge, he verbalized understanding and consent with the discharge plan as it was reviewed with him. All questions were fielded by provider and/or clinic staff prior to patient discharge.    Final Clinical Impressions / Urgent Care Diagnoses:   Final diagnoses:  Tick bite, initial encounter    New Prescriptions:  [place] Controlled Substance Registry consulted? Not Applicable  Meds ordered this encounter  Medications  . doxycycline (VIBRAMYCIN) 100 MG capsule    Sig: Take 1 capsule (100 mg total) by mouth 2 (two) times daily.    Dispense:  20 capsule    Refill:  0    Recommended Follow up Care:  Patient encouraged to follow up with the following provider within the specified time frame, or sooner as dictated by the severity of his symptoms. As always, he was instructed that for any urgent/emergent care needs, he should seek care either here or in the emergency department for more immediate evaluation.  Follow-up Information    Baxter Hire, MD In 1 week.   Specialty: Internal Medicine Contact information: [place] [address] [phone]         NOTE: This note was prepared using Dragon dictation software along with smaller phrase technology. Despite my best ability to proofread, there is the potential that transcriptional errors may still occur from this process, and are completely unintentional.     Karen Kitchens, NP 09/08/18 [phone]

## 2018-09-08 ENCOUNTER — Telehealth: Payer: Self-pay

## 2018-09-08 DIAGNOSIS — E538 Deficiency of other specified B group vitamins: Secondary | ICD-10-CM

## 2018-09-08 NOTE — Telephone Encounter (Signed)
Informed patient of Folate level. Patient made aware Dr. Mike Gip would like for him to start Folic Acid 1 MG Daily and have Folate level rechecked in one month. Patient verbalizes understanding and denies any further questions.

## 2018-09-08 NOTE — Telephone Encounter (Signed)
-----   Message from Lequita Asal, MD sent at 09/08/2018  5:56 AM EDT ----- Regarding: Please call patient  Folate low.  Begin folic acid 1 mg a day.  Check folate level in 1 month.  M

## 2018-09-17 ENCOUNTER — Other Ambulatory Visit: Payer: Medicare Other

## 2018-09-21 ENCOUNTER — Ambulatory Visit: Admit: 2018-09-21 | Payer: Medicare Other | Admitting: Internal Medicine

## 2018-09-21 SURGERY — COLONOSCOPY WITH PROPOFOL
Anesthesia: General

## 2018-09-30 ENCOUNTER — Other Ambulatory Visit: Payer: Self-pay

## 2018-09-30 ENCOUNTER — Other Ambulatory Visit
Admission: RE | Admit: 2018-09-30 | Discharge: 2018-09-30 | Disposition: A | Discharge status: Home / Self Care | Payer: Medicare Other | Source: Ambulatory Visit | Attending: Internal Medicine | Admitting: Internal Medicine

## 2018-09-30 DIAGNOSIS — Z01812 Encounter for preprocedural laboratory examination: Secondary | ICD-10-CM | POA: Insufficient documentation

## 2018-09-30 DIAGNOSIS — Z20828 Contact with and (suspected) exposure to other viral communicable diseases: Secondary | ICD-10-CM | POA: Insufficient documentation

## 2018-09-30 LAB — SARS CORONAVIRUS 2 (TAT 6-24 HRS): SARS Coronavirus 2: NEGATIVE

## 2018-10-01 ENCOUNTER — Encounter: Payer: Self-pay | Admitting: *Deleted

## 2018-10-04 ENCOUNTER — Ambulatory Visit: Payer: Medicare Other | Admitting: Certified Registered"

## 2018-10-04 ENCOUNTER — Ambulatory Visit
Admission: RE | Admit: 2018-10-04 | Discharge: 2018-10-04 | Disposition: A | Discharge status: Home / Self Care | Payer: Medicare Other | Attending: Internal Medicine | Admitting: Internal Medicine

## 2018-10-04 ENCOUNTER — Encounter: Payer: Self-pay | Admitting: *Deleted

## 2018-10-04 ENCOUNTER — Encounter
Admission: RE | Disposition: A | Discharge status: Home / Self Care | Payer: Self-pay | Source: Home / Self Care | Attending: Internal Medicine

## 2018-10-04 DIAGNOSIS — D124 Benign neoplasm of descending colon: Secondary | ICD-10-CM | POA: Diagnosis not present

## 2018-10-04 DIAGNOSIS — Z86010 Personal history of colonic polyps: Secondary | ICD-10-CM | POA: Diagnosis not present

## 2018-10-04 DIAGNOSIS — J449 Chronic obstructive pulmonary disease, unspecified: Secondary | ICD-10-CM | POA: Diagnosis not present

## 2018-10-04 DIAGNOSIS — Z79899 Other long term (current) drug therapy: Secondary | ICD-10-CM | POA: Diagnosis not present

## 2018-10-04 DIAGNOSIS — Z87891 Personal history of nicotine dependence: Secondary | ICD-10-CM | POA: Diagnosis not present

## 2018-10-04 DIAGNOSIS — I10 Essential (primary) hypertension: Secondary | ICD-10-CM | POA: Insufficient documentation

## 2018-10-04 DIAGNOSIS — C8590 Non-Hodgkin lymphoma, unspecified, unspecified site: Secondary | ICD-10-CM | POA: Insufficient documentation

## 2018-10-04 DIAGNOSIS — R569 Unspecified convulsions: Secondary | ICD-10-CM | POA: Insufficient documentation

## 2018-10-04 DIAGNOSIS — D123 Benign neoplasm of transverse colon: Secondary | ICD-10-CM | POA: Insufficient documentation

## 2018-10-04 DIAGNOSIS — K624 Stenosis of anus and rectum: Secondary | ICD-10-CM | POA: Diagnosis not present

## 2018-10-04 DIAGNOSIS — N529 Male erectile dysfunction, unspecified: Secondary | ICD-10-CM | POA: Insufficient documentation

## 2018-10-04 DIAGNOSIS — Z09 Encounter for follow-up examination after completed treatment for conditions other than malignant neoplasm: Secondary | ICD-10-CM | POA: Diagnosis present

## 2018-10-04 DIAGNOSIS — K5730 Diverticulosis of large intestine without perforation or abscess without bleeding: Secondary | ICD-10-CM | POA: Diagnosis not present

## 2018-10-04 DIAGNOSIS — K640 First degree hemorrhoids: Secondary | ICD-10-CM | POA: Insufficient documentation

## 2018-10-04 HISTORY — PX: COLONOSCOPY WITH PROPOFOL: SHX5780

## 2018-10-04 SURGERY — COLONOSCOPY WITH PROPOFOL
Anesthesia: General

## 2018-10-04 MED ORDER — PROPOFOL 10 MG/ML IV BOLUS
INTRAVENOUS | Status: DC | PRN
  Administered 2018-10-04 (×2): 20 mg via INTRAVENOUS
  Administered 2018-10-04: 50 mg via INTRAVENOUS
  Administered 2018-10-04 (×2): 20 mg via INTRAVENOUS
  Administered 2018-10-04: 50 mg via INTRAVENOUS
  Administered 2018-10-04 (×2): 20 mg via INTRAVENOUS
  Administered 2018-10-04: 50 mg via INTRAVENOUS

## 2018-10-04 MED ORDER — SODIUM CHLORIDE 0.9 % IV SOLN
INTRAVENOUS | Status: DC
  Administered 2018-10-04: 13:00:00 via INTRAVENOUS

## 2018-10-04 MED ORDER — PHENYLEPHRINE HCL (PRESSORS) 10 MG/ML IV SOLN
INTRAVENOUS | Status: DC | PRN
  Administered 2018-10-04 (×2): 100 ug via INTRAVENOUS

## 2018-10-04 MED ORDER — PROPOFOL 500 MG/50ML IV EMUL
INTRAVENOUS | Status: DC | PRN
  Administered 2018-10-04: 120 ug/kg/min via INTRAVENOUS

## 2018-10-04 NOTE — Interval H&P Note (Signed)
History and Physical Interval Note:  10/04/2018 1:02 PM  Robert Weeks  has presented today for surgery, with the diagnosis of Personal History Colon Polyps.  The various methods of treatment have been discussed with the patient and family. After consideration of risks, benefits and other options for treatment, the patient has consented to  Procedure(s): COLONOSCOPY WITH PROPOFOL (N/A) as a surgical intervention.  The patient's history has been reviewed, patient examined, no change in status, stable for surgery.  I have reviewed the patient's chart and labs.  Questions were answered to the patient's satisfaction.     [place], [place]

## 2018-10-04 NOTE — Transfer of Care (Signed)
Immediate Anesthesia Transfer of Care Note  Patient: Robert Weeks  Procedure(s) Performed: COLONOSCOPY WITH PROPOFOL (N/A )  Patient Location: Endoscopy Unit  Anesthesia Type:General  Level of Consciousness: drowsy and responds to stimulation  Airway & Oxygen Therapy: Patient Spontanous Breathing and Patient connected to face mask oxygen  Post-op Assessment: Report given to RN and Post -op Vital signs reviewed and stable  Post vital signs: Reviewed and stable  Last Vitals:  Vitals Value Taken Time  BP 125/62 10/04/18 1342  Temp    Pulse 72 10/04/18 1343  Resp 25 10/04/18 1343  SpO2 100 % 10/04/18 1343  Vitals shown include unvalidated device data.  Last Pain:  Vitals:   10/04/18 1221  TempSrc: Tympanic  PainSc: 0-No pain         Complications: No apparent anesthesia complications

## 2018-10-04 NOTE — H&P (Signed)
Outpatient short stay form Pre-procedure 10/04/2018 1:01 PM Marsden Zaino K. Alice Reichert, M.D.  Primary Physician: Harrel Lemon, M.D.  Reason for visit:  Personal hx of adenomatous colon polyps  History of present illness:                            Patient presents for colonoscopy for a personal hx of colon polyps. The patient denies abdominal pain, abnormal weight loss or rectal bleeding.     Current Facility-Administered Medications:  .  0.9 %  sodium chloride infusion, , Intravenous, Continuous, [place], Benay Pike, MD, Last Rate: 20 mL/hr at 10/04/18 1258  Medications Prior to Admission  Medication Sig Dispense Refill Last Dose  . ADVAIR DISKUS 250-50 MCG/DOSE AEPB Inhale 1 puff into the lungs 2 (two) times daily.    10/04/2018 at 0800  . albuterol (PROVENTIL HFA;VENTOLIN HFA) 108 (90 Base) MCG/ACT inhaler Inhale 2 puffs into the lungs every 6 (six) hours as needed.    Past Month at Unknown time  . lamoTRIgine (LAMICTAL) 25 MG tablet Take 50 mg by mouth 2 (two) times daily.    10/04/2018 at Unknown time  . levETIRAcetam (KEPPRA) 750 MG tablet Take 750 mg by mouth 2 (two) times daily.    10/04/2018 at Unknown time  . lisinopril (PRINIVIL,ZESTRIL) 5 MG tablet Take 5 mg by mouth daily.    10/04/2018 at Unknown time  . potassium chloride SA (K-DUR,KLOR-CON) 20 MEQ tablet Take 20 mEq by mouth daily.   10/03/2018 at Unknown time  . doxycycline (VIBRAMYCIN) 100 MG capsule Take 1 capsule (100 mg total) by mouth 2 (two) times daily. (Patient not taking: Reported on 10/04/2018) 20 capsule 0 Not Taking at Unknown time  . sildenafil (REVATIO) 20 MG tablet Take 3 to 5 tablets two hours before intercouse on an empty stomach.  Do not take with nitrates. 50 tablet 3      Allergies  Allergen Reactions  . Finasteride      Past Medical History:  Diagnosis Date  . Asthma   . BPH (benign prostatic hyperplasia)   . Cancer [facility])    lymphom dx in 2011 in remission  . COPD (chronic obstructive pulmonary disease)  ([place])   . Elevated PSA   . Erectile dysfunction   . Gout   . Hypertension   . Lymphoma ([place])   . Seizures ([place])     Review of systems:  Otherwise negative.    Physical Exam  Gen: Alert, oriented. Appears stated age.  HEENT: [place]/AT. PERRLA. Lungs: CTA, no wheezes. CV: RR nl S1, S2. Abd: soft, benign, no masses. BS+ Ext: No edema. Pulses 2+    Planned procedures: Proceed with colonoscopy. The patient understands the nature of the planned procedure, indications, risks, alternatives and potential complications including but not limited to bleeding, infection, perforation, damage to internal organs and possible oversedation/side effects from anesthesia. The patient agrees and gives consent to proceed.  Please refer to procedure notes for findings, recommendations and patient disposition/instructions.     Zynia Wojtowicz K. Alice Reichert, M.D. Gastroenterology 10/04/2018  1:01 PM

## 2018-10-04 NOTE — Anesthesia Post-op Follow-up Note (Signed)
Anesthesia QCDR form completed.        

## 2018-10-04 NOTE — Op Note (Signed)
[facility] Patient Name: Robert Weeks Procedure Date: 10/04/2018 12:40 PM MRN: [id number] Account #: [id number] Date of Birth: 29-Jul-1940 Admit Type: Outpatient Age: 78 Room: [facility] ENDO ROOM 1 Gender: Male Note Status: Finalized Procedure:            Colonoscopy Indications:          Surveillance: Personal history of adenomatous polyps on                        last colonoscopy > 3 years ago Providers:            Lorie Apley K. Alice Reichert MD, MD Referring MD:         Baxter Hire, MD (Referring MD) Medicines:            Propofol per Anesthesia Complications:        No immediate complications. Procedure:            Pre-Anesthesia Assessment:                       - The risks and benefits of the procedure and the                        sedation options and risks were discussed with the                        patient. All questions were answered and informed                        consent was obtained.                       - Patient identification and proposed procedure were                        verified prior to the procedure by the nurse. The                        procedure was verified in the procedure room.                       - ASA Grade Assessment: III - A patient with severe                        systemic disease.                       - After reviewing the risks and benefits, the patient                        was deemed in satisfactory condition to undergo the                        procedure.                       After obtaining informed consent, the colonoscope was                        passed under direct vision. Throughout the procedure,  the patient's blood pressure, pulse, and oxygen                        saturations were monitored continuously. The                        Colonoscope was introduced through the anus and                        advanced to the the cecum, identified by appendiceal                 orifice and ileocecal valve. The colonoscopy was                        technically difficult and complex due to restricted                        mobility of the colon, a redundant colon and                        significant looping. Successful completion of the                        procedure was aided by changing the patient to a prone                        position. The patient tolerated the procedure well. The                        quality of the bowel preparation was adequate. The                        ileocecal valve, appendiceal orifice, and rectum were                        photographed. Findings:      The perianal examination was normal.      The digital rectal exam findings include anal stricture. Pertinent       negatives include normal sphincter tone.      A 4 mm polyp was found in the descending colon. The polyp was sessile.       The polyp was removed with a jumbo cold forceps. Resection and retrieval       were complete.      A 7 mm polyp was found in the transverse colon. The polyp was sessile.       The polyp was removed with a cold snare. Resection and retrieval were       complete.      Multiple small and large-mouthed diverticula were found in the entire       colon.      Non-bleeding internal hemorrhoids were found during retroflexion. The       hemorrhoids were Grade I (internal hemorrhoids that do not prolapse).      The exam was otherwise without abnormality. 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                       - One 4 mm polyp in the descending colon, removed with  a jumbo cold forceps. Resected and retrieved.                       - One 7 mm polyp in the transverse colon, removed with                        a cold snare. Resected and retrieved.                       - Diverticulosis in the entire examined colon.                       - Non-bleeding internal hemorrhoids.                        - The examination was otherwise normal. Recommendation:       - Patient has a contact number available for                        emergencies. The signs and symptoms of potential                        delayed complications were discussed with the patient.                        Return to normal activities tomorrow. Written discharge                        instructions were provided to the patient.                       - Resume previous diet.                       - Continue present medications.                       - Await pathology results.                       - No repeat colonoscopy due to current age (80 years or                        older).                       - Return to GI office PRN. Procedure Code(s):    --- Professional ---                       [phone], Colonoscopy, flexible; with removal of tumor(s),                        polyp(s), or other lesion(s) by snare technique                       06269, 11, Colonoscopy, flexible; with biopsy, single                        or multiple Diagnosis Code(s):    --- Professional ---  K57.30, Diverticulosis of large intestine without                        perforation or abscess without bleeding                       K64.0, First degree hemorrhoids                       K63.5, Polyp of colon                       K62.4, Stenosis of anus and rectum                       Z86.010, Personal history of colonic polyps CPT copyright 2019 American Medical Association. All rights reserved. The codes documented in this report are preliminary and upon coder review may  be revised to meet current compliance requirements. Efrain Sella MD, MD 10/04/2018 1:42:18 PM This report has been signed electronically. Number of Addenda: 0 Note Initiated On: 10/04/2018 12:40 PM Scope Withdrawal Time: 0 hours 7 minutes 46 seconds  Total Procedure Duration: 0 hours 27 minutes 12 seconds  Estimated Blood Loss: Estimated  blood loss was minimal.      [facility]

## 2018-10-04 NOTE — Anesthesia Preprocedure Evaluation (Signed)
Anesthesia Evaluation  Patient identified by MRN, date of birth, ID band Patient awake    Reviewed: Allergy & Precautions, NPO status , Patient's Chart, lab work & pertinent test results  History of Anesthesia Complications Negative for: history of anesthetic complications  Airway Mallampati: II  TM Distance: >3 FB Neck ROM: Full    Dental  (+) Upper Dentures, Dental Advidsory Given   Pulmonary neg shortness of breath, asthma , COPD, neg recent URI, former smoker,    Pulmonary exam normal        Cardiovascular Exercise Tolerance: Good hypertension, (-) angina(-) Past MI Normal cardiovascular exam(-) dysrhythmias (-) Valvular Problems/Murmurs  RBBB on EKG   Neuro/Psych Seizures -, Well Controlled,  PSYCHIATRIC DISORDERS Schizophrenia    GI/Hepatic Neg liver ROS, Adaptive colitis   Endo/Other  negative endocrine ROS  Renal/GU negative Renal ROS  negative genitourinary   Musculoskeletal negative musculoskeletal ROS (+)   Abdominal Normal abdominal exam  (+)   Peds negative pediatric ROS (+)  Hematology   Anesthesia Other Findings Past Medical History: No date: Asthma No date: BPH (benign prostatic hyperplasia) No date: Cancer [facility])     Comment:  lymphom dx in 2011 in remission No date: COPD (chronic obstructive pulmonary disease) ([facility]) No date: Elevated PSA No date: Erectile dysfunction No date: Gout No date: Hypertension No date: Lymphoma ([place]) No date: Seizures ([facility])   Reproductive/Obstetrics                             Anesthesia Physical  Anesthesia Plan  ASA: III  Anesthesia Plan: General   Post-op Pain Management:    Induction: Intravenous  PONV Risk Score and Plan: 2 and Propofol infusion and TIVA  Airway Management Planned: Nasal Cannula and Natural Airway  Additional Equipment:   Intra-op Plan:   Post-operative Plan:   Informed Consent: I have reviewed the  patients History and Physical, chart, labs and discussed the procedure including the risks, benefits and alternatives for the proposed anesthesia with the patient or authorized representative who has indicated his/her understanding and acceptance.     Dental advisory given  Plan Discussed with: CRNA and Surgeon  Anesthesia Plan Comments:         Anesthesia Quick Evaluation

## 2018-10-05 ENCOUNTER — Encounter: Payer: Self-pay | Admitting: Internal Medicine

## 2018-10-05 NOTE — Anesthesia Postprocedure Evaluation (Signed)
Anesthesia Post Note  Patient: Robert Weeks  Procedure(s) Performed: COLONOSCOPY WITH PROPOFOL (N/A )  Patient location during evaluation: Endoscopy Anesthesia Type: General Level of consciousness: awake and alert Pain management: pain level controlled Vital Signs Assessment: post-procedure vital signs reviewed and stable Respiratory status: spontaneous breathing, nonlabored ventilation, respiratory function stable and patient connected to nasal cannula oxygen Cardiovascular status: blood pressure returned to baseline and stable Postop Assessment: no apparent nausea or vomiting Anesthetic complications: no     Last Vitals:  Vitals:   10/04/18 1402 10/04/18 1403  BP: 93/70   Pulse: 82   Resp: (!) 22 18  Temp:    SpO2: 100%     Last Pain:  Vitals:   10/04/18 1402  TempSrc:   PainSc: 0-No pain                 Martha Clan

## 2018-10-06 LAB — SURGICAL PATHOLOGY

## 2018-10-08 ENCOUNTER — Other Ambulatory Visit: Payer: Self-pay

## 2018-10-11 ENCOUNTER — Inpatient Hospital Stay: Payer: Medicare Other | Attending: Hematology and Oncology

## 2018-10-11 ENCOUNTER — Other Ambulatory Visit: Payer: Self-pay

## 2018-10-11 DIAGNOSIS — C8228 Follicular lymphoma grade III, unspecified, lymph nodes of multiple sites: Secondary | ICD-10-CM | POA: Diagnosis not present

## 2018-10-11 DIAGNOSIS — E538 Deficiency of other specified B group vitamins: Secondary | ICD-10-CM | POA: Diagnosis not present

## 2018-10-11 LAB — FOLATE: Folate: 7.8 ng/mL (ref >5.9)

## 2018-11-09 ENCOUNTER — Other Ambulatory Visit: Payer: Self-pay

## 2018-11-09 ENCOUNTER — Ambulatory Visit (INDEPENDENT_AMBULATORY_CARE_PROVIDER_SITE_OTHER): Payer: Medicare Other

## 2018-11-09 ENCOUNTER — Encounter (INDEPENDENT_AMBULATORY_CARE_PROVIDER_SITE_OTHER): Payer: Self-pay | Admitting: Vascular Surgery

## 2018-11-09 ENCOUNTER — Ambulatory Visit (INDEPENDENT_AMBULATORY_CARE_PROVIDER_SITE_OTHER): Payer: Medicare Other | Admitting: Vascular Surgery

## 2018-11-09 VITALS — BP 137/94 | HR 71 | Resp 16 | Ht 70.0 in | Wt 147.4 lb

## 2018-11-09 DIAGNOSIS — I10 Essential (primary) hypertension: Secondary | ICD-10-CM

## 2018-11-09 DIAGNOSIS — I714 Abdominal aortic aneurysm, without rupture, unspecified: Secondary | ICD-10-CM

## 2018-11-09 NOTE — Patient Instructions (Signed)
Abdominal Aortic Aneurysm  An aneurysm is a bulge in one of the blood vessels that carry blood away from the heart (artery). It happens when blood pushes up against a weak or damaged place in the wall of an artery. An abdominal aortic aneurysm happens in the main artery of the body (aorta). Some aneurysms may not cause problems. If it grows, it can burst or tear, causing bleeding inside the body. This is an emergency. It needs to be treated right away. What are the causes? The exact cause of this condition is not known. What increases the risk? The following may make you more likely to get this condition:  Being a male who is 60 years of age or older.  Being white (Caucasian).  Using tobacco.  Having a family history of aneurysms.  Having the following conditions: ? Hardening of the arteries (arteriosclerosis). ? Inflammation of the walls of an artery (arteritis). ? Certain genetic conditions. ? Being very overweight (obesity). ? An infection in the wall of the aorta (infectious aortitis). ? High cholesterol. ? High blood pressure (hypertension). What are the signs or symptoms? Symptoms depend on the size of the aneurysm and how fast it is growing. Most grow slowly and do not cause any symptoms. If symptoms do occur, they may include:  Pain in the belly (abdomen), side, or back.  Feeling full after eating only small amounts of food.  Feeling a throbbing lump in the belly. Symptoms that the aneurysm has burst (ruptured) include:  Sudden, very bad pain in the belly, side, or back.  Feeling sick to your stomach (nauseous).  Throwing up (vomiting).  Feeling light-headed or passing out. How is this treated? Treatment for this condition depends on:  The size of the aneurysm.  How fast it is growing.  Your age.  Your risk of having it burst. If your aneurysm is smaller than 2 inches (5 cm), your doctor may manage it by:  Checking it often to see if it is getting bigger.  You may have an imaging test (ultrasound) to check it every 3-6 months, every year, or every few years.  Giving you medicines to: ? Control blood pressure. ? Treat pain. ? Fight infection. If your aneurysm is larger than 2 inches (5 cm), you may need surgery to fix it. Follow these instructions at home: Lifestyle  Do not use any products that have nicotine or tobacco in them. This includes cigarettes, e-cigarettes, and chewing tobacco. If you need help quitting, ask your doctor.  Get regular exercise. Ask your doctor what types of exercise are best for you. Eating and drinking  Eat a heart-healthy diet. This includes eating plenty of: ? Fresh fruits and vegetables. ? Whole grains. ? Low-fat (lean) protein. ? Low-fat dairy products.  Avoid foods that are high in saturated fat and cholesterol. These foods include red meat and some dairy products.  Do not drink alcohol if: ? Your doctor tells you not to drink. ? You are pregnant, may be pregnant, or are planning to become pregnant.  If you drink alcohol: ? Limit how much you use to:  0-1 drink a day for women.  0-2 drinks a day for men. ? Be aware of how much alcohol is in your drink. In the [place] one drink equals any of these:  One typical bottle of beer (12 oz).  One-half glass of wine (5 oz).  One shot of hard liquor (1 oz). General instructions  Take over-the-counter and prescription medicines only as   told by your doctor.  Keep your blood pressure within normal limits. Ask your doctor what your blood pressure should be.  Have your blood sugar (glucose) level and cholesterol levels checked regularly. Keep your blood sugar level and cholesterol levels within normal limits.  Avoid heavy lifting and activities that take a lot of effort. Ask your doctor what activities are safe for you.  Keep all follow-up visits as told by your doctor. This is important. ? Talk to your doctor about regular screenings to see if the  aneurysm is getting bigger. Contact a doctor if you:  Have pain in your belly, side, or back.  Have a throbbing feeling in your belly.  Have a family history of aneurysms. Get help right away if you:  Have sudden, bad pain in your belly, side, or back.  Feel sick to your stomach.  Throw up.  Have trouble pooping (constipation).  Have trouble peeing (urinating).  Feel light-headed.  Have a fast heart rate when you stand.  Have sweaty skin that is cold to the touch (clammy).  Have shortness of breath.  Have a fever. These symptoms may be an emergency. Do not wait to see if the symptoms will go away. Get medical help right away. Call your local emergency services (911 in the [place] Do not drive yourself to the hospital. Summary  An aneurysm is a bulge in one of the blood vessels that carry blood away from the heart (artery). Some aneurysms may not cause problems.  You may need to have yours checked often. If it grows, it can burst or tear. This causes bleeding inside the body. It needs to be treated right away.  Follow instructions from your doctor about healthy lifestyle changes.  Keep all follow-up visits as told by your doctor. This is important. This information is not intended to replace advice given to you by your health care provider. Make sure you discuss any questions you have with your health care provider. Document Released: 06/07/2012 Document Revised: 05/31/2018 Document Reviewed: 09/19/2017 Elsevier Patient Education  2020 Elsevier Inc.  

## 2018-11-09 NOTE — Assessment & Plan Note (Signed)
blood pressure control important in reducing the progression of atherosclerotic disease and AAA growth. On appropriate oral medications.  

## 2018-11-09 NOTE — Progress Notes (Signed)
MRN : [id number]  Robert Weeks is a 78 y.o. (1941-02-05) male who presents with chief complaint of  Chief Complaint  Patient presents with  . Follow-up    ultrasound follow up  .  History of Present Illness: Patient returns today in follow up of his abdominal aortic aneurysm.  He is doing well today without any aneurysm related symptoms. Specifically, the patient denies new back or abdominal pain, or signs of peripheral embolization. Duplex today shows a 3.8 cm infrarenal abdominal aortic aneurysm with mild enlargement of the iliac arteries into the aneurysmal range as well.  This was 3.7 cm last year so it has not had significant change.  Current Outpatient Medications  Medication Sig Dispense Refill  . ADVAIR DISKUS 250-50 MCG/DOSE AEPB Inhale 1 puff into the lungs 2 (two) times daily.     Marland Kitchen albuterol (PROVENTIL HFA;VENTOLIN HFA) 108 (90 Base) MCG/ACT inhaler Inhale 2 puffs into the lungs every 6 (six) hours as needed.     . lamoTRIgine (LAMICTAL) 25 MG tablet Take 50 mg by mouth 2 (two) times daily.     Marland Kitchen levETIRAcetam (KEPPRA) 750 MG tablet Take 750 mg by mouth 2 (two) times daily.     Marland Kitchen lisinopril (PRINIVIL,ZESTRIL) 5 MG tablet Take 5 mg by mouth daily.     . potassium chloride SA (K-DUR,KLOR-CON) 20 MEQ tablet Take 20 mEq by mouth daily.    . sildenafil (REVATIO) 20 MG tablet Take 3 to 5 tablets two hours before intercouse on an empty stomach.  Do not take with nitrates. 50 tablet 3  . doxycycline (VIBRAMYCIN) 100 MG capsule Take 1 capsule (100 mg total) by mouth 2 (two) times daily. (Patient not taking: Reported on 10/04/2018) 20 capsule 0   No current facility-administered medications for this visit.     Past Medical History:  Diagnosis Date  . Asthma   . BPH (benign prostatic hyperplasia)   . Cancer [facility])    lymphom dx in 2011 in remission  . COPD (chronic obstructive pulmonary disease) ([place])   . Elevated PSA   . Erectile dysfunction   . Gout   . Hypertension   .  Lymphoma ([place])   . Seizures ([place])     Past Surgical History:  Procedure Laterality Date  . COLONOSCOPY WITH PROPOFOL N/A 08/25/2014   Procedure: COLONOSCOPY WITH PROPOFOL;  Surgeon: Hulen Luster, MD;  Location: [facility] ENDOSCOPY;  Service: Gastroenterology;  Laterality: N/A;  . COLONOSCOPY WITH PROPOFOL N/A 10/04/2018   Procedure: COLONOSCOPY WITH PROPOFOL;  Surgeon: Toledo, Benay Pike, MD;  Location: [facility] ENDOSCOPY;  Service: Gastroenterology;  Laterality: N/A;  . CYSTOSCOPY    . lymphectomy    . THORACENTESIS     Social History  Substance Use Topics  . Smoking status: Former Smoker    Quit date: 05/01/2009  . Smokeless tobacco: Current User    Types: Snuff  . Alcohol use 0.0 oz/week      Family History      Family History  Problem Relation Age of Onset  . Kidney disease Father 63  . Prostate cancer Neg Hx   . Bladder Cancer Neg Hx          Allergies  Allergen Reactions  . Finasteride      REVIEW OF SYSTEMS(Negative unless checked)  Constitutional: [] ?Weight loss[] ?Fever[] ?Chills Cardiac:[] ?Chest pain[] ?Chest pressure[] ?Palpitations [] ?Shortness of breath when laying flat [] ?Shortness of breath at rest [] ?Shortness of breath with exertion. Vascular: [] ?Pain in legs with walking[] ?Pain in legsat rest[] ?Pain in legs when  laying flat [] ?Claudication [] ?Pain in feet when walking [] ?Pain in feet at rest [] ?Pain in feet when laying flat [] ?History of DVT [] ?Phlebitis [] ?Swelling in legs [] ?Varicose veins [] ?Non-healing ulcers Pulmonary: [] ?Uses home oxygen [] ?Productive cough[] ?Hemoptysis [] ?Wheeze [] ?COPD [] ?Asthma Neurologic: [] ?Dizziness [] ?Blackouts [] ?Seizures [] ?History of stroke [] ?History of TIA[] ?Aphasia [] ?Temporary blindness[] ?Dysphagia [] ?Weaknessor numbness in arms [] ?Weakness or numbnessin legs Musculoskeletal: [x] ?Arthritis [] ?Joint swelling [] ?Joint pain [] ?Low  back pain Hematologic:[] ?Easy bruising[] ?Easy bleeding [] ?Hypercoagulable state [] ?Anemic  Gastrointestinal:[] ?Blood in stool[] ?Vomiting blood[] ?Gastroesophageal reflux/heartburn[] ?Abdominal pain Genitourinary: [] ?Chronic kidney disease [x] ?Difficulturination [x] ?Frequenturination [] ?Burning with urination[] ?Hematuria Skin: [] ?Rashes [] ?Ulcers [] ?Wounds Psychological: [] ?History of anxiety[] ?History of major depression.    Physical Examination  BP (!) 137/94 (BP Location: Right Arm)   Pulse 71   Resp 16   Ht 5\' 10"  (1.778 m)   Wt 147 lb 6.4 oz (66.9 kg)   BMI 21.15 kg/m  Gen:  WD/WN, NAD Head: [place]/AT, No temporalis wasting. Ear/Nose/Throat: Hearing grossly intact, nares w/o erythema or drainage Eyes: Conjunctiva clear. Sclera non-icteric Neck: Supple.  Trachea midline Pulmonary:  Good air movement, no use of accessory muscles.  Cardiac: RRR, no JVD Vascular:  Vessel Right Left  Radial Palpable Palpable                                   Gastrointestinal: soft, non-tender/non-distended. No guarding/reflex. Mildly increased aortic impulse Musculoskeletal: M/S 5/5 throughout.  No deformity or atrophy. No edema. Neurologic: Sensation grossly intact in extremities.  Symmetrical.  Speech is fluent.  Psychiatric: Judgment intact, Mood & affect appropriate for pt's clinical situation. Dermatologic: No rashes or ulcers noted.  No cellulitis or open wounds.       Labs Recent Results (from the past 2160 hour(s))  Folate     Status: Abnormal   Collection Time: 09/07/18 11:03 AM  Result Value Ref Range   Folate 5.2 (L) >5.9 ng/mL    Comment: Performed at [facility], [address] [place], [place] [address]  Vitamin B12     Status: Abnormal   Collection Time: 09/07/18 11:03 AM  Result Value Ref Range   Vitamin B-12 1,035 (H) 180 - 914 pg/mL    Comment: (NOTE) This assay is not validated for testing neonatal or  myeloproliferative syndrome specimens for Vitamin B12 levels. Performed at [facility], [place] [address] [place], [place] [address]   Uric acid     Status: None   Collection Time: 09/07/18 11:03 AM  Result Value Ref Range   Uric Acid, Serum 5.9 3.7 - 8.6 mg/dL    Comment: Performed at [facility], [address], [place], [place] [address]  Lactate dehydrogenase     Status: None   Collection Time: 09/07/18 11:03 AM  Result Value Ref Range   LDH 144 98 - 192 U/L    Comment: Performed at [facility], [address], [place], [place] [address]  Comprehensive metabolic panel     Status: Abnormal   Collection Time: 09/07/18 11:03 AM  Result Value Ref Range   Sodium 139 135 - 145 mmol/L   Potassium 3.8 3.5 - 5.1 mmol/L   Chloride 107 98 - 111 mmol/L   CO2 23 22 - 32 mmol/L   Glucose, Bld 104 (H) 70 - 99 mg/dL   BUN 14 8 - 23 mg/dL   Creatinine, Ser 1.55 (H) 0.61 - 1.24 mg/dL   Calcium 8.6 (L) 8.9 - 10.3 mg/dL   Total Protein  6.8 6.5 - 8.1 g/dL   Albumin 4.0 3.5 - 5.0 g/dL   AST 24 15 - 41 U/L   ALT 22 0 - 44 U/L   Alkaline Phosphatase 59 38 - 126 U/L   Total Bilirubin 1.0 0.3 - 1.2 mg/dL   GFR calc non Af Amer 42 (L) >60 mL/min   GFR calc Af Amer 49 (L) >60 mL/min   Anion gap 9 5 - 15    Comment: Performed at [facility], [address] [place], [place] [address]  CBC with Differential/Platelet     Status: Abnormal   Collection Time: 09/07/18 11:03 AM  Result Value Ref Range   WBC 7.8 4.0 - 10.5 K/uL   RBC 4.07 (L) 4.22 - 5.81 MIL/uL   Hemoglobin 13.5 13.0 - 17.0 g/dL   HCT 38.6 (L) 39.0 - 52.0 %   MCV 94.8 80.0 - 100.0 fL   MCH 33.2 26.0 - 34.0 pg   MCHC 35.0 30.0 - 36.0 g/dL   RDW 12.6 11.5 - 15.5 %   Platelets 155 150 - 400 K/uL   nRBC 0.0 0.0 - 0.2 %   Neutrophils Relative % 72 %   Neutro Abs 5.6 1.7 - 7.7 K/uL   Lymphocytes Relative 19 %   Lymphs Abs 1.5 0.7 - 4.0 K/uL   Monocytes Relative 8 %   Monocytes Absolute  0.6 0.1 - 1.0 K/uL   Eosinophils Relative 1 %   Eosinophils Absolute 0.1 0.0 - 0.5 K/uL   Basophils Relative 0 %   Basophils Absolute 0.0 0.0 - 0.1 K/uL   Immature Granulocytes 0 %   Abs Immature Granulocytes 0.03 0.00 - 0.07 K/uL    Comment: Performed at [facility], [address], [place], [place] [address]  SARS CORONAVIRUS 2 Nasal Swab Aptima Multi Swab     Status: None   Collection Time: 09/30/18 10:28 AM   Specimen: Aptima Multi Swab; Nasal Swab  Result Value Ref Range   SARS Coronavirus 2 NEGATIVE NEGATIVE    Comment: (NOTE) SARS-CoV-2 target nucleic acids are NOT DETECTED. The SARS-CoV-2 RNA is generally detectable in upper and lower respiratory specimens during the acute phase of infection. Negative results do not preclude SARS-CoV-2 infection, do not rule out co-infections with other pathogens, and should not be used as the sole basis for treatment or other patient management decisions. Negative results must be combined with clinical observations, patient history, and epidemiological information. The expected result is Negative. Fact Sheet for Patients: SugarRoll.be Fact Sheet for Healthcare Providers: https://www.woods-mathews.com/ This test is not yet approved or cleared by the [place] FDA and  has been authorized for detection and/or diagnosis of SARS-CoV-2 by FDA under an Emergency Use Authorization (EUA). This EUA will remain  in effect (meaning this test can be used) for the duration of the COVID-19 declaration under Section 56 4(b)(1) of the Act, 21 U.S.C. section 360bbb-3(b)(1), unless the authorization is terminated or revoked sooner. Performed at [facility], [place] [address], [place], [place] [address]   Surgical pathology     Status: None   Collection Time: 10/04/18  1:34 PM  Result Value Ref Range   SURGICAL PATHOLOGY      Surgical Pathology CASE: [id number] PATIENT: Mackey  Rawlinson Surgical Pathology Report     SPECIMEN SUBMITTED: A. Colon polyp, transverse; cold snare B. Colon polyp, descending; cbx  CLINICAL HISTORY: None provided  PRE-OPERATIVE DIAGNOSIS: Personal history colon polyps  POST-OPERATIVE DIAGNOSIS: Diverticulosis, internal  hemorrhoids, colon polyps     DIAGNOSIS: A.  COLON POLYP, TRANSVERSE; COLD SNARE: - TUBULAR ADENOMA. - NEGATIVE FOR HIGH-GRADE DYSPLASIA AND MALIGNANCY.  B.  COLON POLYP, DESCENDING; COLD BIOPSY: - TUBULAR ADENOMA. - NEGATIVE FOR HIGH-GRADE DYSPLASIA AND MALIGNANCY.  GROSS DESCRIPTION: A. Labeled: Cold snare polyp transverse colon Received: Formalin Tissue fragment(s): 2 Size: 0.2-0.3 cm Description: Tan soft tissue fragments Entirely submitted in 1 cassette.  B. Labeled: C BX polyp descending colon Received: Formalin Tissue fragment(s): 1 Size: 0.4 cm Description: Tan soft tissue fragment Entirely submitted in 1 casset te.   Final Diagnosis performed by Betsy Pries, MD.   Electronically signed 10/06/2018 9:48:08AM The electronic signature indicates that the named Attending Pathologist has evaluated the specimen  Technical component performed at [facility], [address], [place], [place] [address] Lab: [phone] Dir: Rush Farmer, MD, MMM  Professional component performed at [facility], [facility], [place], [place], [place] [address] Lab: [phone] Dir: Dellia Nims. Rubinas, MD   Folate     Status: None   Collection Time: 10/11/18 10:23 AM  Result Value Ref Range   Folate 7.8 >5.9 ng/mL    Comment: Performed at [facility], [address] [place], [place] [address]    Radiology No results found.  Assessment/Plan  Benign essential HTN blood pressure control important in reducing the progression of atherosclerotic disease and AAA growth. On appropriate oral medications.   AAA (abdominal aortic aneurysm) without rupture (HCC) Duplex today shows  a 3.8 cm infrarenal abdominal aortic aneurysm with mild enlargement of the iliac arteries into the aneurysmal range as well.  This was 3.7 cm last year so it has not had significant change.  We will continue annual surveillance until it reaches 4 cm and then we would recommend 2-month follow-ups.    Leotis Pain, MD  11/09/2018 8:54 AM    This note was created with Dragon medical transcription system.  Any errors from dictation are purely unintentional

## 2018-11-09 NOTE — Assessment & Plan Note (Signed)
Duplex today shows a 3.8 cm infrarenal abdominal aortic aneurysm with mild enlargement of the iliac arteries into the aneurysmal range as well.  This was 3.7 cm last year so it has not had significant change.  We will continue annual surveillance until it reaches 4 cm and then we would recommend 39-month follow-ups.

## 2019-01-23 ENCOUNTER — Encounter: Payer: Self-pay | Admitting: Intensive Care

## 2019-01-23 ENCOUNTER — Other Ambulatory Visit: Payer: Self-pay

## 2019-01-23 ENCOUNTER — Emergency Department
Admission: EM | Admit: 2019-01-23 | Discharge: 2019-01-23 | Disposition: A | Discharge status: Home / Self Care | Payer: Medicare Other | Attending: Emergency Medicine | Admitting: Emergency Medicine

## 2019-01-23 ENCOUNTER — Emergency Department: Payer: Medicare Other

## 2019-01-23 DIAGNOSIS — Z87891 Personal history of nicotine dependence: Secondary | ICD-10-CM | POA: Insufficient documentation

## 2019-01-23 DIAGNOSIS — U071 COVID-19: Secondary | ICD-10-CM

## 2019-01-23 DIAGNOSIS — R0789 Other chest pain: Secondary | ICD-10-CM | POA: Diagnosis present

## 2019-01-23 DIAGNOSIS — J449 Chronic obstructive pulmonary disease, unspecified: Secondary | ICD-10-CM | POA: Diagnosis not present

## 2019-01-23 DIAGNOSIS — I10 Essential (primary) hypertension: Secondary | ICD-10-CM | POA: Diagnosis not present

## 2019-01-23 DIAGNOSIS — E860 Dehydration: Secondary | ICD-10-CM | POA: Insufficient documentation

## 2019-01-23 DIAGNOSIS — Z79899 Other long term (current) drug therapy: Secondary | ICD-10-CM | POA: Diagnosis not present

## 2019-01-23 DIAGNOSIS — R059 Cough, unspecified: Secondary | ICD-10-CM

## 2019-01-23 DIAGNOSIS — R05 Cough: Secondary | ICD-10-CM

## 2019-01-23 LAB — CBC
HCT: 39.9 % (ref 39.0–52.0)
Hemoglobin: 13.9 g/dL (ref 13.0–17.0)
MCH: 30.9 pg (ref 26.0–34.0)
MCHC: 34.8 g/dL (ref 30.0–36.0)
MCV: 88.7 fL (ref 80.0–100.0)
Platelets: 174 10*3/uL (ref 150–400)
RBC: 4.5 MIL/uL (ref 4.22–5.81)
RDW: 13.1 % (ref 11.5–15.5)
WBC: 5.4 10*3/uL (ref 4.0–10.5)
nRBC: 0 % (ref 0.0–0.2)

## 2019-01-23 LAB — BASIC METABOLIC PANEL
Anion gap: 12 (ref 5–15)
BUN: 35 mg/dL — ABNORMAL HIGH (ref 8–23)
CO2: 16 mmol/L — ABNORMAL LOW (ref 22–32)
Calcium: 8.9 mg/dL (ref 8.9–10.3)
Chloride: 109 mmol/L (ref 98–111)
Creatinine, Ser: 2.17 mg/dL — ABNORMAL HIGH (ref 0.61–1.24)
GFR calc Af Amer: 32 mL/min — ABNORMAL LOW (ref >60)
GFR calc non Af Amer: 28 mL/min — ABNORMAL LOW (ref >60)
Glucose, Bld: 106 mg/dL — ABNORMAL HIGH (ref 70–99)
Potassium: 5.2 mmol/L — ABNORMAL HIGH (ref 3.5–5.1)
Sodium: 137 mmol/L (ref 135–145)

## 2019-01-23 LAB — POC SARS CORONAVIRUS 2 AG: SARS Coronavirus 2 Ag: POSITIVE — AB

## 2019-01-23 LAB — TROPONIN I (HIGH SENSITIVITY): Troponin I (High Sensitivity): 9 ng/L (ref <18)

## 2019-01-23 MED ORDER — GUAIFENESIN 100 MG/5ML PO SOLN: 5.0000 mL | ORAL | 0 refills | Status: AC | PRN

## 2019-01-23 MED ORDER — DOXYCYCLINE HYCLATE 100 MG PO CAPS: 100.0000 mg | ORAL_CAPSULE | Freq: Two times a day (BID) | ORAL | 0 refills | Status: AC

## 2019-01-23 MED ORDER — SODIUM CHLORIDE 0.9 % IV BOLUS
500.0000 mL | Freq: Once | INTRAVENOUS | Status: AC
  Administered 2019-01-23: 11:00:00 500 mL via INTRAVENOUS

## 2019-01-23 NOTE — ED Notes (Signed)
EDP at bedside  

## 2019-01-23 NOTE — ED Provider Notes (Addendum)
[facility] Emergency Department Provider Note  ____________________________________________  Time seen: Approximately 11:28 AM  I have reviewed the triage vital signs and the nursing notes.   HISTORY  Chief Complaint Chest Pain    HPI Robert Weeks is a 78 y.o. male with a history of BPH COPD hypertension lymphoma who comes the ED complaining of shortness of breath for the past 3 days.  Denies chest pain.  Associated with nonproductive cough and a feeling of chest congestion.  Not exertional, not pleuritic.  No aggravating or alleviating factors.  No fevers chills or body aches or sick contacts.  No radiating discomfort.      Past Medical History:  Diagnosis Date  . Asthma   . BPH (benign prostatic hyperplasia)   . Cancer [facility])    lymphom dx in 2011 in remission  . COPD (chronic obstructive pulmonary disease) ([place])   . Elevated PSA   . Erectile dysfunction   . Gout   . Hypertension   . Lymphoma ([place])   . Seizures [facility])      Patient Active Problem List   Diagnosis Date Noted  . Tick bite 09/07/2018  . Tremor 11/11/2017  . AAA (abdominal aortic aneurysm) without rupture ([place]) 10/31/2016  . Hoarse 07/15/2016  . 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 Psychosis in elderly with behavioral disturbance ([place]) 07/24/2015  . Involuntary commitment 07/24/2015  . BPH with obstruction/lower urinary tract symptoms 01/23/2015  . Erectile dysfunction of organic origin 01/23/2015  . B12 deficiency 01/05/2015  . Anemia 01/01/2015  . Unsteady gait 12/16/2014  . BP (high blood pressure) 08/02/2014  . Lymphoma ([place]) 08/02/2014  . 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 Seizure ([place]) 08/03/2013  . Mechanical and motor problems with internal organs 08/03/2013  . Episode of syncope 08/03/2013  . CAFL (chronic airflow limitation) ([place]) 06/01/2013  . Benign essential HTN 06/01/2013     Past Surgical History:  Procedure Laterality Date   . COLONOSCOPY WITH PROPOFOL N/A 08/25/2014   Procedure: COLONOSCOPY WITH PROPOFOL;  Surgeon: Hulen Luster, MD;  Location: [facility] ENDOSCOPY;  Service: Gastroenterology;  Laterality: N/A;  . COLONOSCOPY WITH PROPOFOL N/A 10/04/2018   Procedure: COLONOSCOPY WITH PROPOFOL;  Surgeon: Toledo, Benay Pike, MD;  Location: [facility] ENDOSCOPY;  Service: Gastroenterology;  Laterality: N/A;  . CYSTOSCOPY    . lymphectomy    . THORACENTESIS       Prior to Admission medications   Medication Sig Start Date End Date Taking? Authorizing Provider  ADVAIR DISKUS 250-50 MCG/DOSE AEPB Inhale 1 puff into the lungs 2 (two) times daily.  07/26/14   [provider]  albuterol (PROVENTIL HFA;VENTOLIN HFA) 108 (90 Base) MCG/ACT inhaler Inhale 2 puffs into the lungs every 6 (six) hours as needed.  10/31/15   [provider]  doxycycline (VIBRAMYCIN) 100 MG capsule Take 1 capsule (100 mg total) by mouth 2 (two) times daily for 7 days. 01/23/19 01/30/19  Carrie Mew, MD  guaiFENesin (ROBITUSSIN) 100 MG/5ML SOLN Take 5 mLs (100 mg total) by mouth every 4 (four) hours as needed for cough or to loosen phlegm. 01/23/19   Carrie Mew, MD  lamoTRIgine (LAMICTAL) 25 MG tablet Take 50 mg by mouth 2 (two) times daily.  03/29/15   [provider]  levETIRAcetam (KEPPRA) 750 MG tablet Take 750 mg by mouth 2 (two) times daily.  06/28/14   [provider]  lisinopril (PRINIVIL,ZESTRIL) 5 MG tablet Take 5 mg by mouth daily.  07/10/14  [provider]  potassium chloride SA (K-DUR,KLOR-CON) 20 MEQ tablet Take 20 mEq by mouth daily.    [provider]  sildenafil (REVATIO) 20 MG tablet Take 3 to 5 tablets two hours before intercouse on an empty stomach.  Do not take with nitrates. 01/23/16   Zara Council A, PA-C     Allergies Finasteride   Family History  Problem Relation Age of Onset  . Kidney disease Father 88  . Prostate cancer Neg Hx   . Bladder Cancer Neg Hx     Social  History Social History   Tobacco Use  . Smoking status: Former Smoker    Types: Cigarettes    Quit date: 05/01/2009    Years since quitting: 9.7  . Smokeless tobacco: Current User    Types: Snuff  Substance Use Topics  . Alcohol use: Yes    Alcohol/week: 8.0 standard drinks    Types: 1 Cans of beer, 3 Shots of liquor, 4 Standard drinks or equivalent per week    Comment: 2-3 times weekly  . Drug use: No    Review of Systems  Constitutional:   No fever or chills.  ENT:   No sore throat. No rhinorrhea. Cardiovascular:   No chest pain or syncope. Respiratory:   Positive shortness of breath and nonproductive cough. Gastrointestinal:   Negative for abdominal pain, vomiting and diarrhea.  Musculoskeletal:   Negative for focal pain or swelling All other systems reviewed and are negative except as documented above in ROS and HPI.  ____________________________________________   PHYSICAL EXAM:  VITAL SIGNS: ED Triage Vitals  Enc Vitals Group     BP 01/23/19 0829 109/68     Pulse Rate 01/23/19 0829 94     Resp 01/23/19 0829 16     Temp 01/23/19 0829 97.7 F (36.5 C)     Temp Source 01/23/19 0829 Oral     SpO2 01/23/19 0829 97 %     Weight 01/23/19 0830 150 lb (68 kg)     Height 01/23/19 0830 5\' 10"  (1.778 m)     Head Circumference --      Peak Flow --      Pain Score 01/23/19 0830 6     Pain Loc --      Pain Edu? --      Excl. in [place]? --     Vital signs reviewed, nursing assessments reviewed.   Constitutional:   Alert and oriented. Non-toxic appearance. Eyes:   Conjunctivae are normal. EOMI. PERRL. ENT      Head:   Normocephalic and atraumatic.      Nose:   Wearing a mask.      Mouth/Throat:   Wearing a mask.      Neck:   No meningismus. Full ROM. Hematological/Lymphatic/Immunilogical:   No cervical lymphadenopathy. Cardiovascular:   RRR. Symmetric bilateral radial and DP pulses.  No murmurs. Cap refill less than 2 seconds. Respiratory:   Normal respiratory effort  without tachypnea/retractions. Breath sounds are clear and equal bilaterally. No wheezes/rales/rhonchi.  Normal expiratory phase. Gastrointestinal:   Soft and nontender. Non distended. There is no CVA tenderness.  No rebound, rigidity, or guarding.  Musculoskeletal:   Normal range of motion in all extremities. No joint effusions.  No lower extremity tenderness.  No edema. Neurologic:   Normal speech and language.  Motor grossly intact. No acute focal neurologic deficits are appreciated.  Skin:    Skin is warm, dry and intact. No rash noted.  No petechiae, purpura,  or bullae.  ____________________________________________    LABS (pertinent positives/negatives) (all labs ordered are listed, but only abnormal results are displayed) Labs Reviewed  BASIC METABOLIC PANEL - Abnormal; Notable for the following components:      Result Value   Potassium 5.2 (*)    CO2 16 (*)    Glucose, Bld 106 (*)    BUN 35 (*)    Creatinine, Ser 2.17 (*)    GFR calc non Af Amer 28 (*)    GFR calc Af Amer 32 (*)    All other components within normal limits  POC SARS CORONAVIRUS 2 AG - Abnormal; Notable for the following components:   SARS Coronavirus 2 Ag POSITIVE (*)    All other components within normal limits  CBC  POC SARS CORONAVIRUS 2 AG -  ED  TROPONIN I (HIGH SENSITIVITY)   ____________________________________________   EKG  Interpreted by me Sinus rhythm rate of 80.  Left axis, right bundle branch block.  No acute ischemic changes.  Unchanged compared to previous EKG Jul 24, 2015.  ____________________________________________    RADIOLOGY  Dg Chest 2 View  Result Date: 01/23/2019 CLINICAL DATA:  Productive cough and shortness of breath. EXAM: CHEST - 2 VIEW COMPARISON:  07/24/2015 chest x-ray. CT of the chest on 07/09/2016 FINDINGS: Stable heart size and aortic tortuosity. Interval removal of Port-A-Cath. Mild density of both lung bases most likely represents atelectasis. Subtle early  pneumonia cannot be entirely excluded. There is no evidence of pulmonary edema, pneumothorax, nodule or pleural fluid. IMPRESSION: Mild density of both lung bases most likely represents atelectasis. Subtle early pneumonia cannot be entirely excluded. Electronically Signed   By: Aletta Edouard M.D.   On: 01/23/2019 09:08    ____________________________________________   PROCEDURES Procedures  ____________________________________________  DIFFERENTIAL DIAGNOSIS   Pneumonia, COPD exacerbation, COVID-19.  Doubt ACS PE dissection AAA pneumothorax pericarditis.  CLINICAL IMPRESSION / ASSESSMENT AND PLAN / ED COURSE  Medications ordered in the ED: Medications  sodium chloride 0.9 % bolus 500 mL (0 mLs Intravenous Stopped 01/23/19 1138)    Pertinent labs & imaging results that were available during my care of the patient were reviewed by me and considered in my medical decision making (see chart for details).  Robert Weeks was evaluated in Emergency Department on 01/23/2019 for the symptoms described in the history of present illness. He was evaluated in the context of the global COVID-19 pandemic, which necessitated consideration that the patient might be at risk for infection with the SARS-CoV-2 virus that causes COVID-19. Institutional protocols and algorithms that pertain to the evaluation of patients at risk for COVID-19 are in a state of rapid change based on information released by regulatory bodies including the CDC and federal and state organizations. These policies and algorithms were followed during the patient's care in the ED.     Clinical Course as of Jan 23 1235  Nancy Fetter Jan 23, 2019  0901 Patient presents with thick sputum and cough and congestion for the past 3 days.  No specific chest pain or exertional symptoms.  Cardiac work-up not indicated.  Chest x-ray image viewed by me which does not show a focal infiltrate, shows severe COPD without pneumothorax.  We will do a Covid  rapid antigen test, check labs.  If work-up is reassuring, given the normal vital signs and reassuring exam, patient can be discharged home on a course of antibiotics (Augmentin or doxycycline) for CAP/COPD.   [PS]  1235 Patient sister is at bedside.  He was living with her up until 4 days ago.  She plans to bring him back to her house so that she can take care of him and monitor his symptoms even though this does increase risk of exposure for herself.  She does plan to have her self and any household members tested.   [PS]    Clinical Course User Index [PS] Carrie Mew, MD     ----------------------------------------- 11:57 AM on 01/23/2019 -----------------------------------------  Covid rapid antigen test is positive.  Continue treat the patient with doxycycline and guaifenesin, recommend close follow-up with health department and PCP, quarantine.  No hypoxia or increased WOB, stable for DC home at this time. Return precautions discussed.   ____________________________________________   FINAL CLINICAL IMPRESSION(S) / ED DIAGNOSES    Final diagnoses:  Cough  Chronic obstructive pulmonary disease, unspecified COPD type ([place])  COVID-19 virus infection  Dehydration     ED Discharge Orders         Ordered    guaiFENesin (ROBITUSSIN) 100 MG/5ML SOLN  Every 4 hours PRN     01/23/19 1127    doxycycline (VIBRAMYCIN) 100 MG capsule  2 times daily     01/23/19 1127          Portions of this note were generated with dragon dictation software. Dictation errors may occur despite best attempts at proofreading.   Carrie Mew, MD 01/23/19 [place]    Carrie Mew, MD 01/23/19 1236

## 2019-01-23 NOTE — Discharge Instructions (Signed)
Your lab tests show some dehydration today.  Your chest x-ray looks okay, but your Covid test is positive.  You will need to quarantine at home for the next 2 weeks.  Any household family members or other close contacts will need to quarantine and be tested as well.  Please contact the health department for further information.  If you develop worsening shortness of breath, please call 911 or return to the emergency department right away for reassessment.

## 2019-01-23 NOTE — ED Triage Notes (Signed)
Patient c/o chest tightness with SOB X3 days. HX COPD

## 2019-01-26 NOTE — Progress Notes (Deleted)
01/27/2019 8:52 PM   Robert Weeks Jul 30, 1940 [id number]  Referring provider: Baxter Hire, MD [place],   [id number]  No chief complaint on file.   HPI: Patient is a 78 year old male with erectile dysfunction and BPH with LUTS who presents today for his 12 month follow up.  BPH WITH LUTS His IPSS score today is ***, which is *** lower urinary tract symptomatology. He is mostly *** with his quality life due to his urinary symptoms.  His previous IPSS score was 1/2.  His complaint today is nocturia x 1-2.  He denies any dysuria, hematuria or suprapubic pain.   He was on finasteride in the past, but he discontinued the medication due to dizzy spells.  He also denies any recent fevers, chills, nausea or vomiting.  He does not have a family history of PCa.  He states about once a month he has a scrotal ache that lasts less than a day.  It is not associated with scrotal swelling and he has not felt any lumps or boils.      Score:  1-7 Mild 8-19 Moderate 20-35 Severe   PMH: Past Medical History:  Diagnosis Date  . Asthma   . BPH (benign prostatic hyperplasia)   . Cancer [facility])    lymphom dx in 2011 in remission  . COPD (chronic obstructive pulmonary disease) ([place])   . Elevated PSA   . Erectile dysfunction   . Gout   . Hypertension   . Lymphoma ([place])   . Seizures [facility])     Surgical History: Past Surgical History:  Procedure Laterality Date  . COLONOSCOPY WITH PROPOFOL N/A 08/25/2014   Procedure: COLONOSCOPY WITH PROPOFOL;  Surgeon: Hulen Luster, MD;  Location: [facility] ENDOSCOPY;  Service: Gastroenterology;  Laterality: N/A;  . COLONOSCOPY WITH PROPOFOL N/A 10/04/2018   Procedure: COLONOSCOPY WITH PROPOFOL;  Surgeon: Toledo, Benay Pike, MD;  Location: [facility] ENDOSCOPY;  Service: Gastroenterology;  Laterality: N/A;  . CYSTOSCOPY    . lymphectomy    . THORACENTESIS      Home Medications:  Allergies as of 01/27/2019      Reactions   Finasteride        Medication List       Accurate as of January 26, 2019  8:52 PM. If you have any questions, ask your nurse or doctor.        Advair Diskus 250-50 MCG/DOSE Aepb Generic drug: Fluticasone-Salmeterol Inhale 1 puff into the lungs 2 (two) times daily.   albuterol 108 (90 Base) MCG/ACT inhaler Commonly known as: VENTOLIN HFA Inhale 2 puffs into the lungs every 6 (six) hours as needed.   doxycycline 100 MG capsule Commonly known as: VIBRAMYCIN Take 1 capsule (100 mg total) by mouth 2 (two) times daily for 7 days.   guaiFENesin 100 MG/5ML Soln Commonly known as: ROBITUSSIN Take 5 mLs (100 mg total) by mouth every 4 (four) hours as needed for cough or to loosen phlegm.   lamoTRIgine 25 MG tablet Commonly known as: LAMICTAL Take 50 mg by mouth 2 (two) times daily.   levETIRAcetam 750 MG tablet Commonly known as: KEPPRA Take 750 mg by mouth 2 (two) times daily.   lisinopril 5 MG tablet Commonly known as: ZESTRIL Take 5 mg by mouth daily.   potassium chloride SA 20 MEQ tablet Commonly known as: KLOR-CON Take 20 mEq by mouth daily.   sildenafil 20 MG tablet Commonly known as: REVATIO Take 3 to 5 tablets two hours  before intercouse on an empty stomach.  Do not take with nitrates.       Allergies:  Allergies  Allergen Reactions  . Finasteride     Family History: Family History  Problem Relation Age of Onset  . Kidney disease Father 5  . Prostate cancer Neg Hx   . Bladder Cancer Neg Hx     Social History:  reports that he quit smoking about 9 years ago. His smoking use included cigarettes. His smokeless tobacco use includes snuff. He reports current alcohol use of about 8.0 standard drinks of alcohol per week. He reports that he does not use drugs.  ROS:                                        Physical Exam: There were no vitals taken for this visit.  Constitutional:  Well nourished. Alert and oriented, No acute distress. HEENT: [place] AT,  moist mucus membranes.  Trachea midline, no masses. Cardiovascular: No clubbing, cyanosis, or edema. Respiratory: Normal respiratory effort, no increased work of breathing. GI: Abdomen is soft, non tender, non distended, no abdominal masses. Liver and spleen not palpable.  No hernias appreciated.  Stool sample for occult testing is not indicated.   GU: No CVA tenderness.  No bladder fullness or masses.  Patient with circumcised/uncircumcised phallus. ***Foreskin easily retracted***  Urethral meatus is patent.  No penile discharge. No penile lesions or rashes. Scrotum without lesions, cysts, rashes and/or edema.  Testicles are located scrotally bilaterally. No masses are appreciated in the testicles. Left and right epididymis are normal. Rectal: Patient with  normal sphincter tone. Anus and perineum without scarring or rashes. No rectal masses are appreciated. Prostate is approximately *** grams, *** nodules are appreciated. Seminal vesicles are normal. Skin: No rashes, bruises or suspicious lesions. Lymph: No cervical or inguinal adenopathy. Neurologic: Grossly intact, no focal deficits, moving all 4 extremities. Psychiatric: Normal mood and affect.    Laboratory Data: Lab Results  Component Value Date   WBC 5.4 01/23/2019   HGB 13.9 01/23/2019   HCT 39.9 01/23/2019   MCV 88.7 01/23/2019   PLT 174 01/23/2019    Lab Results  Component Value Date   CREATININE 2.17 (H) 01/23/2019   PSA History  4.0 ng/mL on 04/06/2013  3.8 ng/mL on 10/12/2013  1.0 ng/mL on 04/14/2014 Lab Results  Component Value Date   PSA 3.1 06/25/2012   PSA 5.0 (H) 05/26/2011    I have reviewed the labs.   Assessment & Plan:    1. BPH with LU TS IPSS score is 1/2, it is stable He will return to clinic in one year for IPSS score and exam.    2. Erectile dysfunction Not interested in any treatment at this time.    3. Scrotal pain Exam benign Will contact us if symptoms worsen   No follow-ups on  file.  Zara Council, PA-C  [facility] [address] [place], [place] [address] [phone]

## 2019-01-27 ENCOUNTER — Ambulatory Visit: Payer: Self-pay | Admitting: Urology

## 2019-02-04 ENCOUNTER — Inpatient Hospital Stay
Admission: EM | Admit: 2019-02-04 | Discharge: 2019-02-25 | DRG: 177 | Disposition: E | Payer: Medicare Other | Attending: Internal Medicine | Admitting: Internal Medicine

## 2019-02-04 ENCOUNTER — Emergency Department: Payer: Medicare Other

## 2019-02-04 ENCOUNTER — Other Ambulatory Visit: Payer: Self-pay

## 2019-02-04 DIAGNOSIS — J441 Chronic obstructive pulmonary disease with (acute) exacerbation: Secondary | ICD-10-CM | POA: Diagnosis present

## 2019-02-04 DIAGNOSIS — J1289 Other viral pneumonia: Secondary | ICD-10-CM | POA: Diagnosis present

## 2019-02-04 DIAGNOSIS — G9349 Other encephalopathy: Secondary | ICD-10-CM | POA: Diagnosis not present

## 2019-02-04 DIAGNOSIS — I959 Hypotension, unspecified: Secondary | ICD-10-CM | POA: Diagnosis not present

## 2019-02-04 DIAGNOSIS — N179 Acute kidney failure, unspecified: Secondary | ICD-10-CM | POA: Diagnosis not present

## 2019-02-04 DIAGNOSIS — J9311 Primary spontaneous pneumothorax: Secondary | ICD-10-CM | POA: Diagnosis not present

## 2019-02-04 DIAGNOSIS — I129 Hypertensive chronic kidney disease with stage 1 through stage 4 chronic kidney disease, or unspecified chronic kidney disease: Secondary | ICD-10-CM | POA: Diagnosis present

## 2019-02-04 DIAGNOSIS — J939 Pneumothorax, unspecified: Secondary | ICD-10-CM | POA: Diagnosis not present

## 2019-02-04 DIAGNOSIS — Z515 Encounter for palliative care: Secondary | ICD-10-CM | POA: Diagnosis not present

## 2019-02-04 DIAGNOSIS — J9312 Secondary spontaneous pneumothorax: Secondary | ICD-10-CM | POA: Diagnosis present

## 2019-02-04 DIAGNOSIS — Z7189 Other specified counseling: Secondary | ICD-10-CM

## 2019-02-04 DIAGNOSIS — N184 Chronic kidney disease, stage 4 (severe): Secondary | ICD-10-CM | POA: Diagnosis present

## 2019-02-04 DIAGNOSIS — N400 Enlarged prostate without lower urinary tract symptoms: Secondary | ICD-10-CM | POA: Diagnosis present

## 2019-02-04 DIAGNOSIS — Z781 Physical restraint status: Secondary | ICD-10-CM | POA: Diagnosis not present

## 2019-02-04 DIAGNOSIS — R569 Unspecified convulsions: Secondary | ICD-10-CM

## 2019-02-04 DIAGNOSIS — E872 Acidosis: Secondary | ICD-10-CM | POA: Diagnosis present

## 2019-02-04 DIAGNOSIS — G40909 Epilepsy, unspecified, not intractable, without status epilepticus: Secondary | ICD-10-CM | POA: Diagnosis present

## 2019-02-04 DIAGNOSIS — N189 Chronic kidney disease, unspecified: Secondary | ICD-10-CM

## 2019-02-04 DIAGNOSIS — F10231 Alcohol dependence with withdrawal delirium: Secondary | ICD-10-CM | POA: Diagnosis not present

## 2019-02-04 DIAGNOSIS — R0902 Hypoxemia: Secondary | ICD-10-CM

## 2019-02-04 DIAGNOSIS — F10239 Alcohol dependence with withdrawal, unspecified: Secondary | ICD-10-CM | POA: Diagnosis present

## 2019-02-04 DIAGNOSIS — F17290 Nicotine dependence, other tobacco product, uncomplicated: Secondary | ICD-10-CM | POA: Diagnosis present

## 2019-02-04 DIAGNOSIS — J440 Chronic obstructive pulmonary disease with acute lower respiratory infection: Secondary | ICD-10-CM | POA: Diagnosis present

## 2019-02-04 DIAGNOSIS — I10 Essential (primary) hypertension: Secondary | ICD-10-CM | POA: Diagnosis not present

## 2019-02-04 DIAGNOSIS — U071 COVID-19: Principal | ICD-10-CM | POA: Diagnosis present

## 2019-02-04 DIAGNOSIS — Z66 Do not resuscitate: Secondary | ICD-10-CM | POA: Diagnosis not present

## 2019-02-04 DIAGNOSIS — E875 Hyperkalemia: Secondary | ICD-10-CM | POA: Diagnosis present

## 2019-02-04 DIAGNOSIS — Z841 Family history of disorders of kidney and ureter: Secondary | ICD-10-CM | POA: Diagnosis not present

## 2019-02-04 DIAGNOSIS — J9601 Acute respiratory failure with hypoxia: Secondary | ICD-10-CM | POA: Diagnosis present

## 2019-02-04 DIAGNOSIS — Z8572 Personal history of non-Hodgkin lymphomas: Secondary | ICD-10-CM

## 2019-02-04 DIAGNOSIS — J9382 Other air leak: Secondary | ICD-10-CM | POA: Diagnosis not present

## 2019-02-04 DIAGNOSIS — G92 Toxic encephalopathy: Secondary | ICD-10-CM | POA: Diagnosis present

## 2019-02-04 DIAGNOSIS — N170 Acute kidney failure with tubular necrosis: Secondary | ICD-10-CM | POA: Diagnosis present

## 2019-02-04 DIAGNOSIS — J930 Spontaneous tension pneumothorax: Secondary | ICD-10-CM | POA: Diagnosis not present

## 2019-02-04 DIAGNOSIS — I878 Other specified disorders of veins: Secondary | ICD-10-CM | POA: Diagnosis not present

## 2019-02-04 LAB — COMPREHENSIVE METABOLIC PANEL
ALT: 12 U/L (ref 0–44)
AST: 16 U/L (ref 15–41)
Albumin: 3.4 g/dL — ABNORMAL LOW (ref 3.5–5.0)
Alkaline Phosphatase: 97 U/L (ref 38–126)
Anion gap: 16 — ABNORMAL HIGH (ref 5–15)
BUN: 97 mg/dL — ABNORMAL HIGH (ref 8–23)
CO2: 17 mmol/L — ABNORMAL LOW (ref 22–32)
Calcium: 9.8 mg/dL (ref 8.9–10.3)
Chloride: 116 mmol/L — ABNORMAL HIGH (ref 98–111)
Creatinine, Ser: 3.95 mg/dL — ABNORMAL HIGH (ref 0.61–1.24)
GFR calc Af Amer: 16 mL/min — ABNORMAL LOW (ref >60)
GFR calc non Af Amer: 13 mL/min — ABNORMAL LOW (ref >60)
Glucose, Bld: 128 mg/dL — ABNORMAL HIGH (ref 70–99)
Potassium: 5.4 mmol/L — ABNORMAL HIGH (ref 3.5–5.1)
Sodium: 149 mmol/L — ABNORMAL HIGH (ref 135–145)
Total Bilirubin: 1.2 mg/dL (ref 0.3–1.2)
Total Protein: 8 g/dL (ref 6.5–8.1)

## 2019-02-04 LAB — CBC WITH DIFFERENTIAL/PLATELET
Abs Immature Granulocytes: 0.28 10*3/uL — ABNORMAL HIGH (ref 0.00–0.07)
Basophils Absolute: 0 10*3/uL (ref 0.0–0.1)
Basophils Relative: 0 %
Eosinophils Absolute: 0 10*3/uL (ref 0.0–0.5)
Eosinophils Relative: 0 %
HCT: 42.3 % (ref 39.0–52.0)
Hemoglobin: 14.8 g/dL (ref 13.0–17.0)
Immature Granulocytes: 1 %
Lymphocytes Relative: 5 %
Lymphs Abs: 0.9 10*3/uL (ref 0.7–4.0)
MCH: 31 pg (ref 26.0–34.0)
MCHC: 35 g/dL (ref 30.0–36.0)
MCV: 88.7 fL (ref 80.0–100.0)
Monocytes Absolute: 1.5 10*3/uL — ABNORMAL HIGH (ref 0.1–1.0)
Monocytes Relative: 8 %
Neutro Abs: 16.6 10*3/uL — ABNORMAL HIGH (ref 1.7–7.7)
Neutrophils Relative %: 86 %
Platelets: 405 10*3/uL — ABNORMAL HIGH (ref 150–400)
RBC: 4.77 MIL/uL (ref 4.22–5.81)
RDW: 14 % (ref 11.5–15.5)
WBC: 19.4 10*3/uL — ABNORMAL HIGH (ref 4.0–10.5)
nRBC: 0.1 % (ref 0.0–0.2)

## 2019-02-04 LAB — CBC
HCT: 39.4 % (ref 39.0–52.0)
Hemoglobin: 13.3 g/dL (ref 13.0–17.0)
MCH: 31.1 pg (ref 26.0–34.0)
MCHC: 33.8 g/dL (ref 30.0–36.0)
MCV: 92.1 fL (ref 80.0–100.0)
Platelets: 352 10*3/uL (ref 150–400)
RBC: 4.28 MIL/uL (ref 4.22–5.81)
RDW: 14.3 % (ref 11.5–15.5)
WBC: 17.3 10*3/uL — ABNORMAL HIGH (ref 4.0–10.5)
nRBC: 0 % (ref 0.0–0.2)

## 2019-02-04 LAB — CREATININE, SERUM
Creatinine, Ser: 4.06 mg/dL — ABNORMAL HIGH (ref 0.61–1.24)
GFR calc Af Amer: 15 mL/min — ABNORMAL LOW (ref >60)
GFR calc non Af Amer: 13 mL/min — ABNORMAL LOW (ref >60)

## 2019-02-04 LAB — LACTIC ACID, PLASMA
Lactic Acid, Venous: 2.5 mmol/L (ref 0.5–1.9)
Lactic Acid, Venous: 1.9 mmol/L (ref 0.5–1.9)

## 2019-02-04 LAB — FIBRIN DERIVATIVES D-DIMER (ARMC ONLY): Fibrin derivatives D-dimer (ARMC): 6034.16 ng/mL (FEU) — ABNORMAL HIGH (ref 0.00–499.00)

## 2019-02-04 LAB — LACTATE DEHYDROGENASE: LDH: 210 U/L — ABNORMAL HIGH (ref 98–192)

## 2019-02-04 LAB — MRSA PCR SCREENING: MRSA by PCR: NEGATIVE

## 2019-02-04 LAB — ABO/RH: ABO/RH(D): O POS

## 2019-02-04 LAB — C-REACTIVE PROTEIN: CRP: 6.7 mg/dL — ABNORMAL HIGH (ref <1.0)

## 2019-02-04 LAB — TRIGLYCERIDES: Triglycerides: 114 mg/dL (ref <150)

## 2019-02-04 LAB — TROPONIN I (HIGH SENSITIVITY): Troponin I (High Sensitivity): 15 ng/L (ref <18)

## 2019-02-04 LAB — FIBRINOGEN: Fibrinogen: >750 mg/dL — ABNORMAL HIGH (ref 210–475)

## 2019-02-04 LAB — FERRITIN: Ferritin: 2316 ng/mL — ABNORMAL HIGH (ref 24–336)

## 2019-02-04 LAB — GLUCOSE, CAPILLARY: Glucose-Capillary: 113 mg/dL — ABNORMAL HIGH (ref 70–99)

## 2019-02-04 LAB — PROCALCITONIN: Procalcitonin: 0.27 ng/mL

## 2019-02-04 MED ORDER — MIDAZOLAM HCL 2 MG/2ML IJ SOLN
INTRAMUSCULAR | Status: AC
  Administered 2019-02-04: 2 mg via INTRAVENOUS
  Filled 2019-02-04: qty 2

## 2019-02-04 MED ORDER — SODIUM BICARBONATE 8.4 % IV SOLN: 50.0000 meq | Freq: Once | INTRAVENOUS | Status: DC

## 2019-02-04 MED ORDER — LIDOCAINE HCL (PF) 1 % IJ SOLN
INTRAMUSCULAR | Status: AC
  Filled 2019-02-04: qty 10

## 2019-02-04 MED ORDER — ENOXAPARIN SODIUM 40 MG/0.4ML ~~LOC~~ SOLN
40.0000 mg | SUBCUTANEOUS | Status: DC
  Administered 2019-02-04: 40 mg via SUBCUTANEOUS
  Filled 2019-02-04: qty 0.4

## 2019-02-04 MED ORDER — LEVETIRACETAM 750 MG PO TABS
750.0000 mg | ORAL_TABLET | Freq: Two times a day (BID) | ORAL | Status: DC
  Filled 2019-02-04: qty 1

## 2019-02-04 MED ORDER — VANCOMYCIN HCL IN DEXTROSE 1-5 GM/200ML-% IV SOLN: 1000.0000 mg | Freq: Once | INTRAVENOUS | Status: DC

## 2019-02-04 MED ORDER — INSULIN ASPART 100 UNIT/ML IV SOLN
10.0000 [IU] | Freq: Once | INTRAVENOUS | Status: DC
  Filled 2019-02-04: qty 0.1

## 2019-02-04 MED ORDER — ENOXAPARIN SODIUM 40 MG/0.4ML ~~LOC~~ SOLN: 40.0000 mg | SUBCUTANEOUS | Status: DC

## 2019-02-04 MED ORDER — MIDAZOLAM HCL 2 MG/2ML IJ SOLN: 2.0000 mg | Freq: Once | INTRAMUSCULAR | Status: AC

## 2019-02-04 MED ORDER — PIPERACILLIN-TAZOBACTAM 3.375 G IVPB 30 MIN: 3.3750 g | Freq: Once | INTRAVENOUS | Status: DC

## 2019-02-04 MED ORDER — MORPHINE SULFATE (PF) 4 MG/ML IV SOLN
4.0000 mg | Freq: Once | INTRAVENOUS | Status: AC
  Administered 2019-02-04: 15:00:00 4 mg via INTRAVENOUS
  Filled 2019-02-04: qty 1

## 2019-02-04 MED ORDER — ASCORBIC ACID 500 MG PO TABS
500.0000 mg | ORAL_TABLET | Freq: Every day | ORAL | Status: DC
  Filled 2019-02-04: qty 1

## 2019-02-04 MED ORDER — ADULT MULTIVITAMIN W/MINERALS CH: 1.0000 | ORAL_TABLET | Freq: Every day | ORAL | Status: DC

## 2019-02-04 MED ORDER — FOLIC ACID 1 MG PO TABS: 1.0000 mg | ORAL_TABLET | Freq: Every day | ORAL | Status: DC

## 2019-02-04 MED ORDER — TAMSULOSIN HCL 0.4 MG PO CAPS: 0.4000 mg | ORAL_CAPSULE | Freq: Every day | ORAL | Status: DC

## 2019-02-04 MED ORDER — LAMOTRIGINE 25 MG PO TABS: 150.0000 mg | ORAL_TABLET | Freq: Two times a day (BID) | ORAL | Status: DC

## 2019-02-04 MED ORDER — MIDAZOLAM HCL 2 MG/2ML IJ SOLN
2.0000 mg | Freq: Once | INTRAMUSCULAR | Status: AC
  Administered 2019-02-04: 2 mg via INTRAVENOUS
  Filled 2019-02-04: qty 2

## 2019-02-04 MED ORDER — SODIUM CHLORIDE 0.9 % IV SOLN
750.0000 mg | Freq: Two times a day (BID) | INTRAVENOUS | Status: DC
  Administered 2019-02-04 – 2019-02-08 (×8): 750 mg via INTRAVENOUS
  Filled 2019-02-04: qty 7.5
  Filled 2019-02-04: qty 5
  Filled 2019-02-04 (×9): qty 7.5

## 2019-02-04 MED ORDER — VITAMIN B-1 100 MG PO TABS: 100.0000 mg | ORAL_TABLET | Freq: Every day | ORAL | Status: DC

## 2019-02-04 MED ORDER — SODIUM CHLORIDE 0.9 % IV SOLN
Freq: Once | INTRAVENOUS | Status: AC
  Administered 2019-02-04: 15:00:00 via INTRAVENOUS

## 2019-02-04 MED ORDER — DEXTROSE-NACL 5-0.45 % IV SOLN
INTRAVENOUS | Status: DC
  Administered 2019-02-04 – 2019-02-05 (×2): via INTRAVENOUS

## 2019-02-04 MED ORDER — SODIUM CHLORIDE 0.9% FLUSH
3.0000 mL | Freq: Two times a day (BID) | INTRAVENOUS | Status: DC
  Administered 2019-02-06 – 2019-02-14 (×17): 3 mL via INTRAVENOUS

## 2019-02-04 MED ORDER — LIDOCAINE-EPINEPHRINE (PF) 1 %-1:200000 IJ SOLN
30.0000 mL | Freq: Once | INTRAMUSCULAR | Status: AC
  Administered 2019-02-04: 30 mL
  Filled 2019-02-04: qty 30

## 2019-02-04 MED ORDER — ENOXAPARIN SODIUM 30 MG/0.3ML ~~LOC~~ SOLN: 30.0000 mg | SUBCUTANEOUS | Status: DC

## 2019-02-04 MED ORDER — ZINC SULFATE 220 (50 ZN) MG PO CAPS
220.0000 mg | ORAL_CAPSULE | Freq: Every day | ORAL | Status: DC
  Filled 2019-02-04: qty 1

## 2019-02-04 NOTE — Consult Note (Signed)
Patient ID: Robert Weeks, male   DOB: 12-04-1940, 78 y.o.   MRN: [id number]  Chief Complaint  Patient presents with  . Shortness of Breath    Referred By Dr. Lenise Arena Reason for Referral right-sided pneumothorax after small bore catheter placed  HPI Location, Quality, Duration, Severity, Timing, Context, Modifying Factors, Associated Signs and Symptoms.  Robert Weeks is a 78 y.o. male.  He was brought in from home today with complaints of shortness of breath and upon arrival in the emergency room was found to have a right-sided pneumothorax.  A small bore catheter was placed.  After the catheter was placed even with additional suction the lung was not able to be fully expanded and I was asked to see the patient.  The patient was stable and did not complain of any significant shortness of breath.  Examination of the chest tube system did not reveal any obvious abnormalities and the leak was coming from the patient after interrogation of all connectors.  I therefore elected to place a second chest tube.  A 14 French pigtail catheter was inserted using sterile technique just lateral to the previous chest tube.  The chest x-ray performed immediately afterwards showed that the lung was fully expanded.  I remove the small bore chest tube.  The patient tolerated procedure well throughout this procedure.  He was given some Versed.  He is Covid positive having tested positive approximately 12 days ago.   Past Medical History:  Diagnosis Date  . Asthma   . BPH (benign prostatic hyperplasia)   . Cancer [facility])    lymphom dx in 2011 in remission  . COPD (chronic obstructive pulmonary disease) ([place])   . Elevated PSA   . Erectile dysfunction   . Gout   . Hypertension   . Lymphoma ([place])   . Seizures ([place])     Past Surgical History:  Procedure Laterality Date  . COLONOSCOPY WITH PROPOFOL N/A 08/25/2014   Procedure: COLONOSCOPY WITH PROPOFOL;  Surgeon: Hulen Luster, MD;  Location: [facility]  ENDOSCOPY;  Service: Gastroenterology;  Laterality: N/A;  . COLONOSCOPY WITH PROPOFOL N/A 10/04/2018   Procedure: COLONOSCOPY WITH PROPOFOL;  Surgeon: Toledo, Benay Pike, MD;  Location: [facility] ENDOSCOPY;  Service: Gastroenterology;  Laterality: N/A;  . CYSTOSCOPY    . lymphectomy    . THORACENTESIS      Family History  Problem Relation Age of Onset  . Kidney disease Father 28  . Prostate cancer Neg Hx   . Bladder Cancer Neg Hx     Social History Social History   Tobacco Use  . Smoking status: Former Smoker    Types: Cigarettes    Quit date: 05/01/2009    Years since quitting: 9.7  . Smokeless tobacco: Current User    Types: Snuff  Substance Use Topics  . Alcohol use: Yes    Alcohol/week: 8.0 standard drinks    Types: 1 Cans of beer, 3 Shots of liquor, 4 Standard drinks or equivalent per week    Comment: 2-3 times weekly  . Drug use: No    Allergies  Allergen Reactions  . Finasteride     Current Facility-Administered Medications  Medication Dose Route Frequency Provider Last Rate Last Admin  . lidocaine (PF) (XYLOCAINE) 1 % injection           . piperacillin-tazobactam (ZOSYN) IVPB 3.375 g  3.375 g Intravenous Once Earleen Newport, MD      . vancomycin (VANCOCIN) IVPB 1000 mg/200 mL premix  1,000 mg  Intravenous Once Earleen Newport, MD       Current Outpatient Medications  Medication Sig Dispense Refill  . guaiFENesin (ROBITUSSIN) 100 MG/5ML SOLN Take 5 mLs (100 mg total) by mouth every 4 (four) hours as needed for cough or to loosen phlegm. 120 mL 0  . lamoTRIgine (LAMICTAL) 150 MG tablet Take 150 mg by mouth 2 (two) times daily.     Marland Kitchen levETIRAcetam (KEPPRA) 750 MG tablet Take 750 mg by mouth 2 (two) times daily.     Marland Kitchen lisinopril (PRINIVIL,ZESTRIL) 5 MG tablet Take 5 mg by mouth daily.     . potassium chloride SA (K-DUR,KLOR-CON) 20 MEQ tablet Take 20 mEq by mouth daily.    . tamsulosin (FLOMAX) 0.4 MG CAPS capsule Take 0.4 mg by mouth daily.        Review  of Systems A complete review of systems was asked and was negative except for the following positive findings unable to be obtained at this time  Blood pressure 97/77, pulse 95, temperature 98.6 F (37 C), temperature source Oral, resp. rate (!) 21, height 5\' 10"  (1.778 m), weight 86.2 kg, SpO2 100 %.  Physical Exam CONSTITUTIONAL:  Pleasant, well-developed, well-nourished, and in no acute distress. EARS, NOSE, MOUTH AND THROAT:  The oropharynx was clear.  Dentition is good repair.  Oral mucosa pink and moist. LYMPH NODES:  Lymph nodes in the neck and axillae were normal RESPIRATORY:  Lungs were distant bilaterally..  Normal respiratory effort without pathologic use of accessory muscles of respiration CARDIOVASCULAR: Heart was regular without murmurs.  There were no carotid bruits. GI: The abdomen was soft, nontender, and nondistended. There were no palpable masses. There was no hepatosplenomegaly. There were normal bowel sounds in all quadrants.  There was a small bore chest tube in place at approximately the fourth intercostal space on the right.  There was an active air leak.  Data Reviewed Chest x-rays  I have personally reviewed the patient's imaging, laboratory findings and medical records.    Assessment    After placement of his second chest tube his lung was reexpanded.  I removed the first chest tube.  Sterile dressings were applied.    Plan    The patient will be admitted to the medicine service or to the intensive care unit service.  Surgery has been consulted for management.  We will continue the patient on 20 cm of water suction.  We should repeat his chest x-ray tomorrow.       Nestor Lewandowsky, MD 01/26/2019, 5:25 PM

## 2019-02-04 NOTE — ED Triage Notes (Signed)
Patient from [facility], sis called ems due to patient feeling week and sob. Patient reports sob, sating low 80"s as per ems. Presents on 3lnc satings soft 90's in no obvious distress.

## 2019-02-04 NOTE — ED Notes (Signed)
Xray resulted pneumothorax. md at bedside to place chest tube. meds given as directed. Patient tolerated procedure well. Sating 100% on 4lnc. pluero vac to low wall suction. Awaiting xray t confirm placement.

## 2019-02-04 NOTE — ED Notes (Addendum)
First attempt to call report as per Nira Conn RN will have night shift nurse call for report.

## 2019-02-04 NOTE — ED Provider Notes (Signed)
[facility] Emergency Department Provider Note       Time seen: ----------------------------------------- 1:45 PM on 01/25/2019 ----------------------------------------- I have reviewed the triage vital signs and the nursing notes.  HISTORY   Chief Complaint Shortness of Breath   HPI Robert Weeks is a 78 y.o. male with a history of asthma, BPH, cancer, COPD, gout, hypertension, lymphoma, seizures who presents to the ED for dyspnea and weakness.  Sister called EMS due to same.  He reports some shortness of breath, oxygen saturations were in the low 80s.  He presents on 3 L nasal cannula with sats in the 90s without any obvious distress.  He was Covid +12 days ago.  Past Medical History:  Diagnosis Date  . Asthma   . BPH (benign prostatic hyperplasia)   . Cancer [facility])    lymphom dx in 2011 in remission  . COPD (chronic obstructive pulmonary disease) ([place])   . Elevated PSA   . Erectile dysfunction   . Gout   . Hypertension   . Lymphoma ([place])   . Seizures [facility])     Patient Active Problem List   Diagnosis Date Noted  . Tick bite 09/07/2018  . Tremor 11/11/2017  . AAA (abdominal aortic aneurysm) without rupture ([place]) 10/31/2016  . Hoarse 07/15/2016  . 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 Psychosis in elderly with behavioral disturbance ([place]) 07/24/2015  . Involuntary commitment 07/24/2015  . BPH with obstruction/lower urinary tract symptoms 01/23/2015  . Erectile dysfunction of organic origin 01/23/2015  . B12 deficiency 01/05/2015  . Anemia 01/01/2015  . Unsteady gait 12/16/2014  . BP (high blood pressure) 08/02/2014  . Lymphoma ([place]) 08/02/2014  . 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 Seizure ([place]) 08/03/2013  . Mechanical and motor problems with internal organs 08/03/2013  . Episode of syncope 08/03/2013  . CAFL (chronic airflow limitation) ([place]) 06/01/2013  . Benign essential HTN 06/01/2013    Past Surgical  History:  Procedure Laterality Date  . COLONOSCOPY WITH PROPOFOL N/A 08/25/2014   Procedure: COLONOSCOPY WITH PROPOFOL;  Surgeon: Hulen Luster, MD;  Location: [facility] ENDOSCOPY;  Service: Gastroenterology;  Laterality: N/A;  . COLONOSCOPY WITH PROPOFOL N/A 10/04/2018   Procedure: COLONOSCOPY WITH PROPOFOL;  Surgeon: Toledo, Benay Pike, MD;  Location: [facility] ENDOSCOPY;  Service: Gastroenterology;  Laterality: N/A;  . CYSTOSCOPY    . lymphectomy    . THORACENTESIS      Allergies Finasteride  Social History Social History   Tobacco Use  . Smoking status: Former Smoker    Types: Cigarettes    Quit date: 05/01/2009    Years since quitting: 9.7  . Smokeless tobacco: Current User    Types: Snuff  Substance Use Topics  . Alcohol use: Yes    Alcohol/week: 8.0 standard drinks    Types: 1 Cans of beer, 3 Shots of liquor, 4 Standard drinks or equivalent per week    Comment: 2-3 times weekly  . Drug use: No   Review of Systems Constitutional: Negative for fever. Cardiovascular: Negative for chest pain. Respiratory: Positive for shortness of breath Gastrointestinal: Negative for abdominal pain, vomiting and diarrhea. Musculoskeletal: Negative for back pain. Skin: Negative for rash. Neurological: Positive for generalized weakness  All systems negative/normal/unremarkable except as stated in the HPI  ____________________________________________   PHYSICAL EXAM:  VITAL SIGNS: ED Triage Vitals  Enc Vitals Group     BP 02/02/2019 1327 124/80     Pulse Rate 02/03/2019 1327 (!) 113     Resp  02/17/2019 1327 (!) 22     Temp 02/09/2019 1327 98.6 F (37 C)     Temp Source 02/11/2019 1327 Oral     SpO2 02/06/2019 1323 92 %     Weight 02/12/2019 1329 190 lb (86.2 kg)     Height 02/11/2019 1329 5\' 10"  (1.778 m)     Head Circumference --      Peak Flow --      Pain Score 02/13/2019 1328 0     Pain Loc --      Pain Edu? --      Excl. in [place]? --    Constitutional: Alert, Chronically ill-appearing, mild  distress Eyes: Conjunctivae are normal. Normal extraocular movements. Cardiovascular: Normal rate, regular rhythm. No murmurs, rubs, or gallops. Respiratory: Mild tachypnea with mostly clear breath sounds, perhaps diminished on the right side Gastrointestinal: Soft and nontender. Normal bowel sounds Musculoskeletal: Nontender with normal range of motion in extremities. No lower extremity tenderness nor edema. Neurologic:  Normal speech and language. No gross focal neurologic deficits are appreciated.  Skin:  Skin is warm, dry and intact. No rash noted. Psychiatric: Mood and affect are normal. Speech and behavior are normal.  ____________________________________________  ED COURSE:  As part of my medical decision making, I reviewed the following data within the [place] History obtained from family if available, nursing notes, old chart and ekg, as well as notes from prior ED visits. Patient presented for COVID-19 and hypoxia, we will assess with labs and imaging as indicated at this time.   CHEST TUBE INSERTION  Date/Time: 02/08/2019 3:42 PM Performed by: Earleen Newport, MD Authorized by: Earleen Newport, MD   Consent:    Consent obtained:  Emergent situation   Consent given by:  Patient Pre-procedure details:    Skin preparation:  ChloraPrep and alcohol Anesthesia (see MAR for exact dosages):    Anesthesia method:  Local infiltration   Local anesthetic:  Lidocaine 1% w/o epi Procedure details:    Placement location:  R anterior   Scalpel size:  11   Tube size (Fr):  8   Tension pneumothorax: no     Tube connected to:  Suction   Drainage characteristics:  Air only   Suture material:  0 silk   Dressing:  4x4 sterile gauze Post-procedure details:    Post-insertion x-ray findings: tube in good position     Patient tolerance of procedure:  Tolerated well, no immediate complications    Robert Weeks was evaluated in Emergency Department on  01/27/2019 for the symptoms described in the history of present illness. He was evaluated in the context of the global COVID-19 pandemic, which necessitated consideration that the patient might be at risk for infection with the SARS-CoV-2 virus that causes COVID-19. Institutional protocols and algorithms that pertain to the evaluation of patients at risk for COVID-19 are in a state of rapid change based on information released by regulatory bodies including the CDC and federal and state organizations. These policies and algorithms were followed during the patient's care in the ED.  ____________________________________________   LABS (pertinent positives/negatives)  Labs Reviewed  LACTIC ACID, PLASMA - Abnormal; Notable for the following components:      Result Value   Lactic Acid, Venous 2.5 (*)    All other components within normal limits  CBC WITH DIFFERENTIAL/PLATELET - Abnormal; Notable for the following components:   WBC 19.4 (*)    Platelets 405 (*)    Neutro Abs 16.6 (*)  Monocytes Absolute 1.5 (*)    Abs Immature Granulocytes 0.28 (*)    All other components within normal limits  COMPREHENSIVE METABOLIC PANEL - Abnormal; Notable for the following components:   Sodium 149 (*)    Potassium 5.4 (*)    Chloride 116 (*)    CO2 17 (*)    Glucose, Bld 128 (*)    BUN 97 (*)    Creatinine, Ser 3.95 (*)    Albumin 3.4 (*)    GFR calc non Af Amer 13 (*)    GFR calc Af Amer 16 (*)    Anion gap 16 (*)    All other components within normal limits  FIBRIN DERIVATIVES D-DIMER ([facility] ONLY) - Abnormal; Notable for the following components:   Fibrin derivatives D-dimer [facility]) 7,858.85 (*)    All other components within normal limits  LACTATE DEHYDROGENASE - Abnormal; Notable for the following components:   LDH 210 (*)    All other components within normal limits  FERRITIN - Abnormal; Notable for the following components:   Ferritin 2,316 (*)    All other components within normal limits   FIBRINOGEN - Abnormal; Notable for the following components:   Fibrinogen >750 (*)    All other components within normal limits  CULTURE, BLOOD (ROUTINE X 2)  CULTURE, BLOOD (ROUTINE X 2)  PROCALCITONIN  TRIGLYCERIDES  LACTIC ACID, PLASMA  C-REACTIVE PROTEIN    RADIOLOGY Images were viewed by me  Chest x-ray IMPRESSION: 1. Moderate-sized right pneumothorax. No evidence of tension pneumothorax. Critical Value/emergent results were called by telephone at the time of interpretation on 02/10/2019 at 2:06 pm to [facility] , who verbally acknowledged these results. 2. Thickened interstitial markings in the lung bases, increased from the prior studies. Interstitial infection or inflammation is Suspected. IMPRESSION: Right chest tube is noted with tip in the right upper chest. Moderate right pneumothorax is unchanged. Patchy opacity noted in left lung base unchanged.  ____________________________________________  CRITICAL CARE Performed by: Laurence Aly  Total critical care time: 30 minutes  Critical care time was exclusive of separately billable procedures and treating other patients.  Critical care was necessary to treat or prevent imminent or life-threatening deterioration.  Critical care was time spent personally by me on the following activities: development of treatment plan with patient and/or surrogate as well as nursing, discussions with consultants, evaluation of patient's response to treatment, examination of patient, obtaining history from patient or surrogate, ordering and performing treatments and interventions, ordering and review of laboratory studies, ordering and review of radiographic studies, pulse oximetry and re-evaluation of patient's condition.    DIFFERENTIAL DIAGNOSIS   COVID-19, hypoxia, pneumonia, PE, pneumothorax, MI  FINAL ASSESSMENT AND PLAN  COVID-19, hypoxia, pneumothorax   Plan: The patient had presented for hypoxia  with a recent diagnosis of COVID-19. Patient's labs do indicate significant leukocytosis, lactic acidosis and acute kidney injury.  Patient's been given IV fluids.  Patient's imaging revealed a pneumothorax for which chest tube was placed as dictated above.  Immediately after chest tube placement there was not significant reexpansion of the lung.  I have consulted thoracic surgery.  Otherwise I will discuss with the hospitalist for admission.   Laurence Aly, MD    Note: This note was generated in part or whole with voice recognition software. Voice recognition is usually quite accurate but there are transcription errors that can and very often do occur. I apologize for any typographical errors that were not detected and corrected.  Earleen Newport, MD 02/12/2019 [phone]

## 2019-02-04 NOTE — ED Notes (Signed)
Dr. Genevive Bi at bedside to replace right sided chest tube.  Dr. Jimmye Norman also in attendance.

## 2019-02-04 NOTE — H&P (Signed)
History and Physical    Robert Weeks JYN:[id number] DOB: [birth year] DOA: 02/24/2019  PCP: Baxter Hire, MD  Patient coming from: Independent living   Chief Complaint: confusion  HPI: Robert Weeks is a 78 y.o. male with medical history significant of BPH, lymphoma, COPD, hypertension, seizure disorder was brought in by EMS for confusion.  Please note patient was confused and unable to speak to me.  Information was given by ER attending Dr. Jimmye Norman and patient's sister over the phone.  Per sister patient was diagnosed with Covid 12 days ago and since then he has not been eating or drinking much.  She tried to get a hold of him on her way to see him but he was not answering his phone and that prompted her to call the Secondary school teacher.  The property manager told sister patient seemed confused.  At that point she called EMS.  Per ER note he had complained of shortness of breath and oxygen saturation was in the low 80s.  He presented to the ED on 3 L nasal cannula with saturation in the 90s without any obvious distress.  ED Course: In the ER he was found with right-sided pneumothorax and a small bore catheter was placed.  After catheter was placed even with additional suction the lung was not able to be fully expanded and cardiothoracic surgery was consulted to see the patient.  They had elected to place a second chest tube.  Chest x-ray performed immediately after showed lung was fully expanded.  The small bore chest tube was removed.  He was given Versed.  Upon my arrival patient was unable to communicate with sleeping and would not even open his eyes.  Review of Systems: All systems reviewed and otherwise negative.    Past Medical History:  Diagnosis Date  . Asthma   . BPH (benign prostatic hyperplasia)   . Cancer [facility])    lymphom dx in 2011 in remission  . COPD (chronic obstructive pulmonary disease) ([place])   . Elevated PSA   . Erectile dysfunction   . Gout   . Hypertension   .  Lymphoma ([place])   . Seizures ([place])     Past Surgical History:  Procedure Laterality Date  . COLONOSCOPY WITH PROPOFOL N/A 08/25/2014   Procedure: COLONOSCOPY WITH PROPOFOL;  Surgeon: Hulen Luster, MD;  Location: [facility] ENDOSCOPY;  Service: Gastroenterology;  Laterality: N/A;  . COLONOSCOPY WITH PROPOFOL N/A 10/04/2018   Procedure: COLONOSCOPY WITH PROPOFOL;  Surgeon: Toledo, Benay Pike, MD;  Location: [facility] ENDOSCOPY;  Service: Gastroenterology;  Laterality: N/A;  . CYSTOSCOPY    . lymphectomy    . THORACENTESIS       reports that he quit smoking about 9 years ago. His smoking use included cigarettes. His smokeless tobacco use includes snuff. He reports current alcohol use of about 8.0 standard drinks of alcohol per week. He reports that he does not use drugs.  Allergies  Allergen Reactions  . Finasteride     Family History  Problem Relation Age of Onset  . Kidney disease Father 49  . Prostate cancer Neg Hx   . Bladder Cancer Neg Hx      Prior to Admission medications   Medication Sig Start Date End Date Taking? Authorizing Provider  guaiFENesin (ROBITUSSIN) 100 MG/5ML SOLN Take 5 mLs (100 mg total) by mouth every 4 (four) hours as needed for cough or to loosen phlegm. 01/23/19  Yes Carrie Mew, MD  lamoTRIgine (LAMICTAL) 150 MG tablet Take 150 mg  by mouth 2 (two) times daily.  03/29/15  Yes [provider]  levETIRAcetam (KEPPRA) 750 MG tablet Take 750 mg by mouth 2 (two) times daily.  06/28/14  Yes [provider]  lisinopril (PRINIVIL,ZESTRIL) 5 MG tablet Take 5 mg by mouth daily.  07/10/14  Yes [provider]  potassium chloride SA (K-DUR,KLOR-CON) 20 MEQ tablet Take 20 mEq by mouth daily.   Yes [provider]  tamsulosin (FLOMAX) 0.4 MG CAPS capsule Take 0.4 mg by mouth daily. 01/06/19  Yes [provider]    Physical Exam: Vitals:   02/11/2019 1327 02/05/2019 1329 01/28/2019 1545 01/28/2019 1600  BP: 124/80  94/60 97/77  Pulse: (!) 113   95   Resp: (!) 22  (!) 25 (!) 21  Temp: 98.6 F (37 C)     TempSrc: Oral     SpO2:   100%   Weight:  86.2 kg    Height:  5\' 10"  (1.778 m)      Constitutional: NAD, calm, comfortable Vitals:   02/07/2019 1327 02/16/2019 1329 01/31/2019 1545 02/20/2019 1600  BP: 124/80  94/60 97/77  Pulse: (!) 113  95   Resp: (!) 22  (!) 25 (!) 21  Temp: 98.6 F (37 C)     TempSrc: Oral     SpO2:   100%   Weight:  86.2 kg    Height:  5\' 10"  (1.778 m)     Gen::sleeping, doesn't open eyes or cooperate with exam. Confused.  ENMT: eyes shut. Mask on  Neck: normal, supple, no masses, no thyromegaly Respiratory: poor respiratory effort , decreased breath sounds at bases no wheezing  cardiovascular: Regular rate and rhythm, no murmurs / rubs / gallops. No extremity edema. Abdomen: Soft, NT/ND  +bs decreased Musculoskeletal: No edema. Mildly mottled. Decrease pedal pulses, cool skin.  Skin: no obvious lesions noted Neurologic: unable to assess Psychiatric:unable to assess   Labs on Admission: I have personally reviewed following labs and imaging studies  CBC: Recent Labs  Lab 02/22/2019 1330  WBC 19.4*  NEUTROABS 16.6*  HGB 14.8  HCT 42.3  MCV 88.7  PLT 166*   Basic Metabolic Panel: Recent Labs  Lab 02/09/2019 1330  NA 149*  K 5.4*  CL 116*  CO2 17*  GLUCOSE 128*  BUN 97*  CREATININE 3.95*  CALCIUM 9.8   GFR: Estimated Creatinine Clearance: 15.4 mL/min (A) (by C-G formula based on SCr of 3.95 mg/dL (H)). Liver Function Tests: Recent Labs  Lab 01/26/2019 1330  AST 16  ALT 12  ALKPHOS 97  BILITOT 1.2  PROT 8.0  ALBUMIN 3.4*   No results for input(s): LIPASE, AMYLASE in the last 168 hours. No results for input(s): AMMONIA in the last 168 hours. Coagulation Profile: No results for input(s): INR, PROTIME in the last 168 hours. Cardiac Enzymes: No results for input(s): CKTOTAL, CKMB, CKMBINDEX, TROPONINI in the last 168 hours. BNP (last 3 results) No results for input(s): PROBNP in  the last 8760 hours. HbA1C: No results for input(s): HGBA1C in the last 72 hours. CBG: No results for input(s): GLUCAP in the last 168 hours. Lipid Profile: Recent Labs    02/18/2019 1330  TRIG 114   Thyroid Function Tests: No results for input(s): TSH, T4TOTAL, FREET4, T3FREE, THYROIDAB in the last 72 hours. Anemia Panel: Recent Labs    02/01/2019 1330  FERRITIN 2,316*   Urine analysis:    Component Value Date/Time   COLORURINE YELLOW (A) 07/24/2015 0520   [place] (  A) 07/24/2015 0520   APPEARANCEUR Clear 08/08/2012 2120   LABSPEC 1.010 07/24/2015 0520   LABSPEC 1.010 08/08/2012 2120   PHURINE 6.0 07/24/2015 0520   GLUCOSEU NEGATIVE 07/24/2015 0520   GLUCOSEU Negative 08/08/2012 2120   HGBUR 2+ (A) 07/24/2015 0520   BILIRUBINUR NEGATIVE 07/24/2015 0520   BILIRUBINUR Negative 08/08/2012 2120   KETONESUR NEGATIVE 07/24/2015 0520   PROTEINUR NEGATIVE 07/24/2015 0520   NITRITE NEGATIVE 07/24/2015 0520   LEUKOCYTESUR TRACE (A) 07/24/2015 0520   LEUKOCYTESUR Negative 08/08/2012 2120    Radiological Exams on Admission: DG Chest 1 View  Result Date: 02/12/2019 CLINICAL DATA:  Status post right chest tube for a right pneumothorax. EXAM: CHEST  1 VIEW COMPARISON:  02/12/2019 at 4:12 p.m. FINDINGS: New pigtail chest tube has placed, extending across the lower hemithorax, curled tip projecting in the medial right hemithorax 3-4 cm below the level of the carina. No change in the smaller caliber chest tube with its tip projecting over the right upper lung. No residual right pneumothorax. Interstitial and hazy airspace lung opacities at the bases, most evident on the left, are unchanged from the earlier exam. No new lung abnormalities. IMPRESSION: 1. Re-expansion of the right lung following the placement of a second chest tube. No residual pneumothorax noted. Electronically Signed   By: Lajean Manes M.D.   On: 02/13/2019 17:24   DG Chest 1 View  Result Date:  01/26/2019 CLINICAL DATA:  Right pneumothorax. Chest tube placement. EXAM: CHEST  1 VIEW 4:30 p.m. COMPARISON:  Radiographs dated 02/11/2019 at 2:53 p.m. and 1:35 p.m. and 01/23/2019 FINDINGS: There has been a slight decrease in the pneumothorax at the right lung base. This is minimal. Chest tube is unchanged in position. No shift of the mediastinal structures to the left to suggest tension. Hazy infiltrate at the left base is slightly more prominent than on the prior study. Heart size and vascularity are normal. IMPRESSION: 1. Slight decrease in the large right pneumothorax at the right lung base. 2. Increased hazy infiltrate at the left base. 3. No change in the right chest tube. Electronically Signed   By: Lorriane Shire M.D.   On: 01/28/2019 16:47   DG Chest 1 View  Result Date: 02/22/2019 CLINICAL DATA:  Right chest tube placement for right pneumothorax. EXAM: CHEST  1 VIEW COMPARISON:  February 04, 2019 FINDINGS: Right chest tube is noted with tip in the right upper chest. Moderate right pneumothorax is unchanged. Patchy opacity noted in left lung base unchanged. The mediastinal contour and cardiac silhouetteare normal. IMPRESSION: Right chest tube is noted with tip in the right upper chest. Moderate right pneumothorax is unchanged. Patchy opacity noted in left lung base unchanged. Electronically Signed   By: Abelardo Diesel M.D.   On: 01/27/2019 15:22   DG Chest Port 1 View  Result Date: 02/14/2019 CLINICAL DATA:  Patient from [facility], sis called ems due to patient feeling week and sob. Patient reports sob, sating low 80"s as per ems. Presents on 3lnc satings soft 90's in no obvious distress. Covid+, diagnosed on 11/29 EXAM: PORTABLE CHEST 1 VIEW COMPARISON:  01/23/2019 and older studies. FINDINGS: Moderate-sized right pneumothorax. Interstitial thickening is noted in the lower lungs, increased when compared to the prior chest radiographs. Remainder of the lungs is clear. Cardiac  silhouette is normal in size. No mediastinal or hilar masses. Skeletal structures are grossly intact. IMPRESSION: 1. Moderate-sized right pneumothorax. No evidence of tension pneumothorax. Critical Value/emergent results were called by telephone at  the time of interpretation on 02/03/2019 at 2:06 pm to [facility] , who verbally acknowledged these results. 2. Thickened interstitial markings in the lung bases, increased from the prior studies. Interstitial infection or inflammation is suspected. Electronically Signed   By: Lajean Manes M.D.   On: 02/05/2019 14:07      Assessment/Plan Active Problems:   Pneumothorax    1.  Pneumothorax-status post chest tube placement. Chest x-ray post chest tube placement lung is expanded We will admit to [facility]U Critical care consulted CTS following   2. acute on chronic kidney disease-likely prerenal due to patient's decreased p.o. intake We will hydrate monitor labs If does not improve will consider nephrology consultation Labs closely  3.  Recent Covid positive infection-12 days ago Will place on precautions droplet/airborne Ferritin, LDH, and fibrin are all elevated Prolactin 0.27 May need CT of the chest but unable to do due to renal insufficiency, will discuss with critical care  4.  Leukocytosis-possibly reactive/stress Given Zosyn and Vanco in ED We will hold antibiotics for now unless becomes febrile  5.  Seizure disorder-continue Keppra and Lamictal  6.  Hypertension-hold lisinopril as patient has hyperkalemia and renal insufficiency acutely Monitor closely. If need to will add amlodipine  7.  Hyperkalemia-mild Ck EKG Insulin/bicarb iv Hold home potassium supplement and lisinopril   DVT prophylaxis: Lovenox Code Status: Full- spoke to daughter - she will discuss this with aunt and brother before making a family decision , for now full code  Family Communication: discussed with daughter and sister  Disposition Plan:  Will be admitted as inpatient as he requires more than 2 midnight stays Consults called: CTS and critical care Admission status: Inpatient   Time spent 62min  Teyton Pattillo MD [facility] Pager [phone]   If 7PM-7AM, please contact night-coverage [email or web address] Password [facility]  02/10/2019, 6:14 PM

## 2019-02-04 NOTE — Procedures (Signed)
I was asked to see this patient on a somewhat urgent basis after chest tube insertion.  A chest x-ray obtained revealed a moderate sized pneumothorax.  I therefore elected to place a second tube.  Dr. Jimmye Norman had previously obtained informed consent and we proceeded forth.  2 mg of intravenous Versed were given for sedation.  The patient was prepped and draped in usual sterile fashion.  1% lidocaine was used as a local anesthetic.  I placed a second tube just posterior to the first tube.  I placed a needle into the pleural space just above the rib and inserted a wire followed by a dilator and finally by the [place] pneumothorax catheter.  The catheter was secured to the chest wall with #1 silk.  Multiple other sutures were placed to hold the catheter in place.  It was then secured and connected to a atrium at 20 cm of water suction.  The previous catheter was turned off and a second chest x-ray is obtained.  This revealed the lung to be expanded.  The first catheter was then removed.  Sterile dressings were applied.  The patient tolerated procedure well without any complications.

## 2019-02-05 ENCOUNTER — Inpatient Hospital Stay: Payer: Medicare Other

## 2019-02-05 ENCOUNTER — Inpatient Hospital Stay (HOSPITAL_COMMUNITY): Payer: Medicare Other

## 2019-02-05 DIAGNOSIS — U071 COVID-19: Secondary | ICD-10-CM

## 2019-02-05 LAB — CBC
HCT: 39.5 % (ref 39.0–52.0)
Hemoglobin: 13.6 g/dL (ref 13.0–17.0)
MCH: 30.9 pg (ref 26.0–34.0)
MCHC: 34.4 g/dL (ref 30.0–36.0)
MCV: 89.8 fL (ref 80.0–100.0)
Platelets: 331 10*3/uL (ref 150–400)
RBC: 4.4 MIL/uL (ref 4.22–5.81)
RDW: 14.5 % (ref 11.5–15.5)
WBC: 15.7 10*3/uL — ABNORMAL HIGH (ref 4.0–10.5)
nRBC: 0 % (ref 0.0–0.2)

## 2019-02-05 LAB — BASIC METABOLIC PANEL
Anion gap: 11 (ref 5–15)
BUN: 98 mg/dL — ABNORMAL HIGH (ref 8–23)
CO2: 17 mmol/L — ABNORMAL LOW (ref 22–32)
Calcium: 8.8 mg/dL — ABNORMAL LOW (ref 8.9–10.3)
Chloride: 123 mmol/L — ABNORMAL HIGH (ref 98–111)
Creatinine, Ser: 3.77 mg/dL — ABNORMAL HIGH (ref 0.61–1.24)
GFR calc Af Amer: 16 mL/min — ABNORMAL LOW (ref >60)
GFR calc non Af Amer: 14 mL/min — ABNORMAL LOW (ref >60)
Glucose, Bld: 138 mg/dL — ABNORMAL HIGH (ref 70–99)
Potassium: 5.1 mmol/L (ref 3.5–5.1)
Sodium: 151 mmol/L — ABNORMAL HIGH (ref 135–145)

## 2019-02-05 LAB — FOLATE: Folate: 5.1 ng/mL — ABNORMAL LOW (ref >5.9)

## 2019-02-05 LAB — SODIUM
Sodium: 154 mmol/L — ABNORMAL HIGH (ref 135–145)
Sodium: 153 mmol/L — ABNORMAL HIGH (ref 135–145)
Sodium: 152 mmol/L — ABNORMAL HIGH (ref 135–145)

## 2019-02-05 LAB — TROPONIN I (HIGH SENSITIVITY): Troponin I (High Sensitivity): 18 ng/L — ABNORMAL HIGH (ref <18)

## 2019-02-05 MED ORDER — SODIUM CHLORIDE 0.9 % IV SOLN
100.0000 mg | Freq: Every day | INTRAVENOUS | Status: DC
  Administered 2019-02-06 – 2019-02-07 (×2): 100 mg via INTRAVENOUS
  Filled 2019-02-05: qty 20
  Filled 2019-02-05 (×2): qty 100

## 2019-02-05 MED ORDER — AZITHROMYCIN 500 MG PO TABS
500.0000 mg | ORAL_TABLET | Freq: Every day | ORAL | Status: DC
  Filled 2019-02-05: qty 1

## 2019-02-05 MED ORDER — HEPARIN SODIUM (PORCINE) 5000 UNIT/ML IJ SOLN
5000.0000 [IU] | Freq: Three times a day (TID) | INTRAMUSCULAR | Status: DC
  Administered 2019-02-05 – 2019-02-13 (×24): 5000 [IU] via SUBCUTANEOUS
  Filled 2019-02-05 (×23): qty 1

## 2019-02-05 MED ORDER — DEXAMETHASONE SODIUM PHOSPHATE 10 MG/ML IJ SOLN
6.0000 mg | Freq: Once | INTRAMUSCULAR | Status: AC
  Administered 2019-02-05: 6 mg via INTRAVENOUS
  Filled 2019-02-05: qty 0.6

## 2019-02-05 MED ORDER — DEXMEDETOMIDINE HCL IN NACL 400 MCG/100ML IV SOLN
0.4000 ug/kg/h | INTRAVENOUS | Status: DC
  Administered 2019-02-05: 0.4 ug/kg/h via INTRAVENOUS
  Administered 2019-02-06 (×3): 0.5 ug/kg/h via INTRAVENOUS
  Administered 2019-02-07 – 2019-02-08 (×2): 0.4 ug/kg/h via INTRAVENOUS
  Administered 2019-02-08: 0.5 ug/kg/h via INTRAVENOUS
  Administered 2019-02-09: 0.6 ug/kg/h via INTRAVENOUS
  Administered 2019-02-09 (×2): 0.8 ug/kg/h via INTRAVENOUS
  Administered 2019-02-09: 0.7 ug/kg/h via INTRAVENOUS
  Administered 2019-02-09 – 2019-02-10 (×2): 1.2 ug/kg/h via INTRAVENOUS
  Administered 2019-02-10: 0.6 ug/kg/h via INTRAVENOUS
  Administered 2019-02-10: 0.5 ug/kg/h via INTRAVENOUS
  Administered 2019-02-11: 1.2 ug/kg/h via INTRAVENOUS
  Administered 2019-02-11: 0.928 ug/kg/h via INTRAVENOUS
  Administered 2019-02-11 – 2019-02-12 (×5): 1.2 ug/kg/h via INTRAVENOUS
  Administered 2019-02-12: 1 ug/kg/h via INTRAVENOUS
  Administered 2019-02-12 (×3): 1.2 ug/kg/h via INTRAVENOUS
  Administered 2019-02-13 – 2019-02-14 (×4): 0.4 ug/kg/h via INTRAVENOUS
  Filled 2019-02-05 (×30): qty 100

## 2019-02-05 MED ORDER — SODIUM CHLORIDE 0.9 % IV SOLN
500.0000 mg | INTRAVENOUS | Status: DC
  Administered 2019-02-05 – 2019-02-08 (×4): 500 mg via INTRAVENOUS
  Filled 2019-02-05 (×5): qty 500

## 2019-02-05 MED ORDER — SODIUM CHLORIDE 0.9 % IV SOLN
200.0000 mg | Freq: Once | INTRAVENOUS | Status: AC
  Administered 2019-02-05: 200 mg via INTRAVENOUS
  Filled 2019-02-05: qty 200

## 2019-02-05 MED ORDER — CHLORHEXIDINE GLUCONATE CLOTH 2 % EX PADS
6.0000 | MEDICATED_PAD | Freq: Every day | CUTANEOUS | Status: DC
  Administered 2019-02-05 – 2019-02-15 (×5): 6 via TOPICAL

## 2019-02-05 MED ORDER — DEXTROSE 5 % IV SOLN
INTRAVENOUS | Status: DC
  Administered 2019-02-05: 14:00:00 via INTRAVENOUS
  Administered 2019-02-09: 100 mL/h via INTRAVENOUS

## 2019-02-05 MED ORDER — MORPHINE SULFATE (PF) 2 MG/ML IV SOLN
2.0000 mg | INTRAVENOUS | Status: DC | PRN
  Administered 2019-02-05 – 2019-02-07 (×6): 2 mg via INTRAVENOUS
  Filled 2019-02-05 (×6): qty 1

## 2019-02-05 MED ORDER — SODIUM CHLORIDE 0.9 % IV SOLN
1.0000 g | INTRAVENOUS | Status: DC
  Administered 2019-02-05 – 2019-02-09 (×5): 1 g via INTRAVENOUS
  Filled 2019-02-05 (×5): qty 1

## 2019-02-05 NOTE — Progress Notes (Signed)
Patient ID: Robert Weeks, male   DOB: 10-Jul-1940, 78 y.o.   MRN: [id number]  Chief Complaint  Patient presents with  . Shortness of Breath    Referred By Dr. Lenise Arena Reason for Referral right-sided pneumothorax after small bore catheter placed  HPI Location, Quality, Duration, Severity, Timing, Context, Modifying Factors, Associated Signs and Symptoms.  Robert Weeks is a 78 y.o. male.  Follow-up after second small bore catheter was placed for persisting right-sided pneumothorax.  There continues to be an air leak.  The patient has been combative.  Examination of the chest tube system did not reveal any obvious abnormalities and the leak is coming from the patient after interrogation of all connectors.  He is Covid positive having tested positive approximately 12 days ago. Although today's chest x-ray report is not available yet, there appears to be a degree of collapse of the right lower lobe, compared with yesterday's chest x-ray.   Past Medical History:  Diagnosis Date  . Asthma   . BPH (benign prostatic hyperplasia)   . Cancer [facility])    lymphom dx in 2011 in remission  . COPD (chronic obstructive pulmonary disease) ([place])   . Elevated PSA   . Erectile dysfunction   . Gout   . Hypertension   . Lymphoma ([place])   . Seizures ([place])     Past Surgical History:  Procedure Laterality Date  . COLONOSCOPY WITH PROPOFOL N/A 08/25/2014   Procedure: COLONOSCOPY WITH PROPOFOL;  Surgeon: Hulen Luster, MD;  Location: [facility] ENDOSCOPY;  Service: Gastroenterology;  Laterality: N/A;  . COLONOSCOPY WITH PROPOFOL N/A 10/04/2018   Procedure: COLONOSCOPY WITH PROPOFOL;  Surgeon: Toledo, Benay Pike, MD;  Location: [facility] ENDOSCOPY;  Service: Gastroenterology;  Laterality: N/A;  . CYSTOSCOPY    . lymphectomy    . THORACENTESIS      Family History  Problem Relation Age of Onset  . Kidney disease Father 31  . Prostate cancer Neg Hx   . Bladder Cancer Neg Hx     Social History Social  History   Tobacco Use  . Smoking status: Former Smoker    Types: Cigarettes    Quit date: 05/01/2009    Years since quitting: 9.7  . Smokeless tobacco: Current User    Types: Snuff  Substance Use Topics  . Alcohol use: Yes    Alcohol/week: 8.0 standard drinks    Types: 1 Cans of beer, 3 Shots of liquor, 4 Standard drinks or equivalent per week    Comment: 2-3 times weekly  . Drug use: No    Allergies  Allergen Reactions  . Finasteride     Current Facility-Administered Medications  Medication Dose Route Frequency Provider Last Rate Last Admin  . Chlorhexidine Gluconate Cloth 2 % PADS 6 each  6 each Topical Daily Amery, Sahar, MD      . dextrose 5 %-0.45 % sodium chloride infusion   Intravenous Continuous Nolberto Hanlon, MD 100 mL/hr at 02/05/19 0522 New Bag at 02/05/19 0522  . enoxaparin (LOVENOX) injection 40 mg  40 mg Subcutaneous Q24H Lu Duffel, RPH   40 mg at 60/73/71 0626  . folic acid (FOLVITE) tablet 1 mg  1 mg Oral Daily Nolberto Hanlon, MD      . insulin aspart (novoLOG) injection 10 Units  10 Units Intravenous Once Nolberto Hanlon, MD      . lamoTRIgine (LAMICTAL) tablet 150 mg  150 mg Oral BID Nolberto Hanlon, MD      . levETIRAcetam (KEPPRA) 750 mg in  sodium chloride 0.9 % 100 mL IVPB  750 mg Intravenous Q12H Nolberto Hanlon, MD 430 mL/hr at 02/07/2019 2222 750 mg at 01/26/2019 2222  . morphine 2 MG/ML injection 2 mg  2 mg Intravenous Q4H PRN Lang Snow, NP   2 mg at 02/05/19 0210  . multivitamin with minerals tablet 1 tablet  1 tablet Oral Daily Nolberto Hanlon, MD      . sodium bicarbonate injection 50 mEq  50 mEq Intravenous Once Nolberto Hanlon, MD      . sodium chloride flush (NS) 0.9 % injection 3 mL  3 mL Intravenous Q12H Nolberto Hanlon, MD      . tamsulosin (FLOMAX) capsule 0.4 mg  0.4 mg Oral Daily Nolberto Hanlon, MD      . thiamine (VITAMIN B-1) tablet 100 mg  100 mg Oral Daily Nolberto Hanlon, MD      . vitamin C (ASCORBIC ACID) tablet 500 mg  500 mg Oral Daily  Nolberto Hanlon, MD      . zinc sulfate capsule 220 mg  220 mg Oral Daily Nolberto Hanlon, MD          Blood pressure 117/84, pulse 96, temperature 98 F (36.7 C), temperature source Axillary, resp. rate (!) 32, height 5\' 10"  (1.778 m), weight 86.2 kg, SpO2 96 %.  Physical Exam CONSTITUTIONAL:  No acute distress, recently sedated.  RESPIRATORY:  Lungs were distant bilaterally.  Minimal right-sided crepitus present.  Minimally increased respiratory effort without pathologic use of accessory muscles of respiration. There is a chest tube in place on the right.  There is active air leak. CARDIOVASCULAR: Heart was regular. GI: The abdomen is soft, nontender, and not significantly distended.   Data Reviewed Chest x-rays: Images reviewed as noted above, radiologist report pending. I have personally reviewed the patient's imaging, laboratory findings and medical records.    Assessment   Persistent air leak, following second right chest tube placement.  Minimal degree of collapse without evidence of any remarkable compromise of respiratory status.     Plan   Surgery will continue CT management.  We will continue the patient on 20 cm of water suction.  We should repeat another chest x-ray tomorrow.       Ronny Bacon, MD 02/05/2019, 9:57 AM

## 2019-02-05 NOTE — Progress Notes (Signed)
Brief Description:  78 yo hx lymphoma, copd, htn, seizures, BIBEMS with AMS confused, family is +covid patient had contact with them and was diagnosed COVID+ on 01/23/19.  In ED he was hypoxemic but improved nasal canula at 3L/min.  Patient found to have R 4.5 cm pneumothorax, s/p chest tube 41F placement initially at Right with no re-expansion noted on post procedure CXR.  Following this a CT surgery consultation was placed and patient had second chest tube placed 14 F inferoposterior to first one and subsequent CXR confirmed re-expansion. Patient had chest tube placed on -20cm water suction. He was admitted to hospitalist service. Patient is now under critical care management as of 02/05/2019.  Hospitalist service is peripherally following.  Subjective: Sleepy this morning  Objective: Vital signs in last 24 hours: Temp:  [96.1 F (35.6 C)-98 F (36.7 C)] 97.9 F (36.6 C) (12/12 0800) Pulse Rate:  [44-106] 102 (12/12 1100) Resp:  [17-40] 40 (12/12 1100) BP: (94-145)/(60-132) 105/85 (12/12 1100) SpO2:  [88 %-100 %] 97 % (12/12 1100)  Intake/Output from previous day: 12/11 0701 - 12/12 0700 In: 134.2 [I.V.:134.2] Out: 300 [Urine:300] Intake/Output this shift: Total I/O In: -  Out: 15 [Chest Tube:15]  Gen::sleeping, doesn't open eyes or cooperate with exam. Confused.  ENMT: eyes shut. Mask on  Neck: normal, supple, no masses, no thyromegaly Respiratory:  Right chest tube x2 cardiovascular: Regular rate and rhythm, no murmurs / rubs / gallops.    Results for orders placed or performed during the hospital encounter of 01/25/2019 (from the past 24 hour(s))  Blood Culture (routine x 2)     Status: None (Preliminary result)   Collection Time: 02/07/2019  4:10 PM   Specimen: BLOOD  Result Value Ref Range   Specimen Description BLOOD BLOOD RIGHT FOREARM    Special Requests      BOTTLES DRAWN AEROBIC AND ANAEROBIC Blood Culture results may not be optimal due to an inadequate volume of blood  received in culture bottles   Culture      NO GROWTH < 24 HOURS Performed at [facility], [address] [place], [place] [address]    Report Status PENDING   C-reactive protein     Status: Abnormal   Collection Time: 02/09/2019  4:10 PM  Result Value Ref Range   CRP 6.7 (H) <1.0 mg/dL  Lactic acid, plasma     Status: None   Collection Time: 02/07/2019  4:11 PM  Result Value Ref Range   Lactic Acid, Venous 1.9 0.5 - 1.9 mmol/L  Blood Culture (routine x 2)     Status: None (Preliminary result)   Collection Time: 02/03/2019  4:11 PM   Specimen: BLOOD  Result Value Ref Range   Specimen Description BLOOD RIGHT ANTECUBITAL    Special Requests      BOTTLES DRAWN AEROBIC AND ANAEROBIC Blood Culture adequate volume   Culture      NO GROWTH < 24 HOURS Performed at [facility], [address], [place], [place] [address]    Report Status PENDING   ABO/Rh     Status: None   Collection Time: 02/12/2019  4:20 PM  Result Value Ref Range   ABO/RH(D)      O POS Performed at [facility], [address] [place], [place] [address]   Glucose, capillary     Status: Abnormal   Collection Time: 01/25/2019  8:19 PM  Result Value Ref Range   Glucose-Capillary 113 (H) 70 - 99 mg/dL  MRSA  PCR Screening     Status: None   Collection Time: 02/05/2019  8:28 PM   Specimen: Nasal Mucosa; Nasopharyngeal  Result Value Ref Range   MRSA by PCR NEGATIVE NEGATIVE  CBC     Status: Abnormal   Collection Time: 01/29/2019  8:42 PM  Result Value Ref Range   WBC 17.3 (H) 4.0 - 10.5 K/uL   RBC 4.28 4.22 - 5.81 MIL/uL   Hemoglobin 13.3 13.0 - 17.0 g/dL   HCT 39.4 39.0 - 52.0 %   MCV 92.1 80.0 - 100.0 fL   MCH 31.1 26.0 - 34.0 pg   MCHC 33.8 30.0 - 36.0 g/dL   RDW 14.3 11.5 - 15.5 %   Platelets 352 150 - 400 K/uL   nRBC 0.0 0.0 - 0.2 %  Creatinine, serum     Status: Abnormal   Collection Time: 02/08/2019  8:42 PM  Result Value Ref Range   Creatinine, Ser 4.06 (H) 0.61 - 1.24 mg/dL   GFR  calc non Af Amer 13 (L) >60 mL/min   GFR calc Af Amer 15 (L) >60 mL/min  Troponin I (High Sensitivity)     Status: None   Collection Time: 02/07/2019  8:43 PM  Result Value Ref Range   Troponin I (High Sensitivity) 15 <18 ng/L  Basic metabolic panel     Status: Abnormal   Collection Time: 02/05/19  6:00 AM  Result Value Ref Range   Sodium 151 (H) 135 - 145 mmol/L   Potassium 5.1 3.5 - 5.1 mmol/L   Chloride 123 (H) 98 - 111 mmol/L   CO2 17 (L) 22 - 32 mmol/L   Glucose, Bld 138 (H) 70 - 99 mg/dL   BUN 98 (H) 8 - 23 mg/dL   Creatinine, Ser 3.77 (H) 0.61 - 1.24 mg/dL   Calcium 8.8 (L) 8.9 - 10.3 mg/dL   GFR calc non Af Amer 14 (L) >60 mL/min   GFR calc Af Amer 16 (L) >60 mL/min   Anion gap 11 5 - 15  CBC     Status: Abnormal   Collection Time: 02/05/19  6:00 AM  Result Value Ref Range   WBC 15.7 (H) 4.0 - 10.5 K/uL   RBC 4.40 4.22 - 5.81 MIL/uL   Hemoglobin 13.6 13.0 - 17.0 g/dL   HCT 39.5 39.0 - 52.0 %   MCV 89.8 80.0 - 100.0 fL   MCH 30.9 26.0 - 34.0 pg   MCHC 34.4 30.0 - 36.0 g/dL   RDW 14.5 11.5 - 15.5 %   Platelets 331 150 - 400 K/uL   nRBC 0.0 0.0 - 0.2 %  Troponin I (High Sensitivity)     Status: Abnormal   Collection Time: 02/05/19  6:00 AM  Result Value Ref Range   Troponin I (High Sensitivity) 18 (H) <18 ng/L  Folate     Status: Abnormal   Collection Time: 02/05/19 10:45 AM  Result Value Ref Range   Folate 5.1 (L) >5.9 ng/mL    Studies/Results: DG Chest 1 View  Result Date: 02/05/2019 CLINICAL DATA:  Status post right chest tube for a right pneumothorax. EXAM: CHEST  1 VIEW COMPARISON:  02/14/2019 at 4:12 p.m. FINDINGS: New pigtail chest tube has placed, extending across the lower hemithorax, curled tip projecting in the medial right hemithorax 3-4 cm below the level of the carina. No change in the smaller caliber chest tube with its tip projecting over the right upper lung. No residual right pneumothorax. Interstitial and hazy airspace lung  opacities at the bases,  most evident on the left, are unchanged from the earlier exam. No new lung abnormalities. IMPRESSION: 1. Re-expansion of the right lung following the placement of a second chest tube. No residual pneumothorax noted. Electronically Signed   By: Lajean Manes M.D.   On: 02/11/2019 17:24   DG Chest 1 View  Result Date: 02/05/2019 CLINICAL DATA:  Right pneumothorax. Chest tube placement. EXAM: CHEST  1 VIEW 4:30 p.m. COMPARISON:  Radiographs dated 02/20/2019 at 2:53 p.m. and 1:35 p.m. and 01/23/2019 FINDINGS: There has been a slight decrease in the pneumothorax at the right lung base. This is minimal. Chest tube is unchanged in position. No shift of the mediastinal structures to the left to suggest tension. Hazy infiltrate at the left base is slightly more prominent than on the prior study. Heart size and vascularity are normal. IMPRESSION: 1. Slight decrease in the large right pneumothorax at the right lung base. 2. Increased hazy infiltrate at the left base. 3. No change in the right chest tube. Electronically Signed   By: Lorriane Shire M.D.   On: 02/17/2019 16:47   DG Chest 1 View  Result Date: 02/03/2019 CLINICAL DATA:  Right chest tube placement for right pneumothorax. EXAM: CHEST  1 VIEW COMPARISON:  February 04, 2019 FINDINGS: Right chest tube is noted with tip in the right upper chest. Moderate right pneumothorax is unchanged. Patchy opacity noted in left lung base unchanged. The mediastinal contour and cardiac silhouetteare normal. IMPRESSION: Right chest tube is noted with tip in the right upper chest. Moderate right pneumothorax is unchanged. Patchy opacity noted in left lung base unchanged. Electronically Signed   By: Abelardo Diesel M.D.   On: 02/14/2019 15:22   DG Chest 2 View  Result Date: 01/23/2019 CLINICAL DATA:  Productive cough and shortness of breath. EXAM: CHEST - 2 VIEW COMPARISON:  07/24/2015 chest x-ray. CT of the chest on 07/09/2016 FINDINGS: Stable heart size and aortic  tortuosity. Interval removal of Port-A-Cath. Mild density of both lung bases most likely represents atelectasis. Subtle early pneumonia cannot be entirely excluded. There is no evidence of pulmonary edema, pneumothorax, nodule or pleural fluid. IMPRESSION: Mild density of both lung bases most likely represents atelectasis. Subtle early pneumonia cannot be entirely excluded. Electronically Signed   By: Aletta Edouard M.D.   On: 01/23/2019 09:08   DG CHEST PORT 1 VIEW  Result Date: 02/05/2019 CLINICAL DATA:  78 year old male with right chest tube for pneumothorax. Positive COVID-19 last my. EXAM: PORTABLE CHEST 1 VIEW COMPARISON:  02/14/2019 and earlier. FINDINGS: Portable AP upright views at 1026 hours. Pigtail right chest tube courses to the midline as before. However, recurrent small pneumothorax is visible at the right lung base today. Pleural edge is best seen at the lateral right 7th, 8th rib level. Stable lung volumes and mediastinal contours. No confluent opacity on the right. Patchy left lung base opacity has not significantly changed. Visualized tracheal air column is within normal limits. Negative visible bowel gas pattern. IMPRESSION: 1. Recurrent small right basilar pneumothorax. Right chest tube remains in place. 2. Stable patchy left lung base opacity. Electronically Signed   By: Genevie Ann M.D.   On: 02/05/2019 12:29   DG Chest Port 1 View  Result Date: 02/11/2019 CLINICAL DATA:  Patient from [facility], sis called ems due to patient feeling week and sob. Patient reports sob, sating low 80"s as per ems. Presents on 3lnc satings soft 90's in no obvious distress. Covid+, diagnosed  on 11/29 EXAM: PORTABLE CHEST 1 VIEW COMPARISON:  01/23/2019 and older studies. FINDINGS: Moderate-sized right pneumothorax. Interstitial thickening is noted in the lower lungs, increased when compared to the prior chest radiographs. Remainder of the lungs is clear. Cardiac silhouette is normal in size. No  mediastinal or hilar masses. Skeletal structures are grossly intact. IMPRESSION: 1. Moderate-sized right pneumothorax. No evidence of tension pneumothorax. Critical Value/emergent results were called by telephone at the time of interpretation on 01/25/2019 at 2:06 pm to [facility] , who verbally acknowledged these results. 2. Thickened interstitial markings in the lung bases, increased from the prior studies. Interstitial infection or inflammation is suspected. Electronically Signed   By: Lajean Manes M.D.   On: 02/01/2019 14:07    Scheduled Meds: . Chlorhexidine Gluconate Cloth  6 each Topical Daily  . enoxaparin (LOVENOX) injection  40 mg Subcutaneous Q24H  . folic acid  1 mg Oral Daily  . insulin aspart  10 Units Intravenous Once  . lamoTRIgine  150 mg Oral BID  . multivitamin with minerals  1 tablet Oral Daily  . sodium bicarbonate  50 mEq Intravenous Once  . sodium chloride flush  3 mL Intravenous Q12H  . tamsulosin  0.4 mg Oral Daily  . thiamine  100 mg Oral Daily  . vitamin C  500 mg Oral Daily  . zinc sulfate  220 mg Oral Daily   Continuous Infusions: . azithromycin 500 mg (02/05/19 1100)  . cefTRIAXone (ROCEPHIN)  IV 1 g (02/05/19 1100)  . dextrose 100 mL/hr at 02/05/19 1357  . levETIRAcetam 750 mg (01/28/2019 2222)  . [START ON 02/06/2019] remdesivir 100 mg in NS 100 mL     PRN Meds:morphine injection  Assessment/Plan: Chart reviewed.  Continue treatment as planned per critical care.   Acute Hypoxic Respiratory Failure -Admitted day 12 of COVID infection with abnormal CXR, hypoxemia and lethargy -s/p chest tube x 2 for pneumothorax - Cont Decadron 6 daily and remdesevir per protocol  -currenlty on 3L/min   -surgery on case - appreciate input  -mental status still with confusion    Alcoholism   - CIWA protocol   - thiamine and folate repletion   -banana bag  - monitor for withdrawal     Chronic COPD without exacerbation   - continue COPD  care path with nuonebs   - already on decadron 6 for covid and empiric abx    Acute on Chronic Renal Failure-most likely due to ATN -follow chem 7 -follow UO -continue Foley Catheter-assess need daily   ID -continue IV abx as prescibed -follow up cultures  GI/Nutrition GI PROPHYLAXIS as indicated DIET-->TF's as tolerated Constipation protocol as indicated  ENDO - ICU hypoglycemic\Hyperglycemia protocol -check FSBS per protocol   ELECTROLYTES -follow labs as needed -replace as needed -pharmacy consultation   DVT/GI PRX ordered -SCDs  TRANSFUSIONS AS NEEDED MONITOR FSBS ASSESS the need for LABS as needed      LOS: 1 day   Amarea Macdowell [place] Dakin

## 2019-02-05 NOTE — Consult Note (Signed)
CRITICAL CARE PROGRESS NOTE    Name: Robert Weeks MRN: [id number] DOB: Nov 26, 1940     LOS: 1   SUBJECTIVE FINDINGS & SIGNIFICANT EVENTS   Patient description:  78 yo hx lymphoma, copd, htn, seizures, BIBEMS with AMS confused, family is +covid patient had contact with them and was diagnosed COVID+ on 01/23/19.  In ED he was hypoxemic but improved nasal canula at 3L/min.  Patient found to have R 4.5 cm pneumothorax, s/p chest tube 79F placement initially at Right with no re-expansion noted on post procedure CXR.  Following this a CT surgery consultation was placed and patient had second chest tube placed 14 F inferoposterior to first one and subsequent CXR confirmed re-expansion. Patient had chest tube placed on -20cm water suction. He was admitted to hospitalist service with critical care consultation placed for further management of above.    Lines / Drains: PIVx3  Cultures / Sepsis markers: -COVID+  Antibiotics: -Vanco/zosyn - stopped now on zithromax/rocephin -Remdesevir  Overnight: No acute events   PAST MEDICAL HISTORY   Past Medical History:  Diagnosis Date  . Asthma   . BPH (benign prostatic hyperplasia)   . Cancer [facility])    lymphom dx in 2011 in remission  . COPD (chronic obstructive pulmonary disease) ([place])   . Elevated PSA   . Erectile dysfunction   . Gout   . Hypertension   . Lymphoma ([place])   . Seizures ([place])      SURGICAL HISTORY   Past Surgical History:  Procedure Laterality Date  . COLONOSCOPY WITH PROPOFOL N/A 08/25/2014   Procedure: COLONOSCOPY WITH PROPOFOL;  Surgeon: Hulen Luster, MD;  Location: [facility] ENDOSCOPY;  Service: Gastroenterology;  Laterality: N/A;  . COLONOSCOPY WITH PROPOFOL N/A 10/04/2018   Procedure: COLONOSCOPY WITH PROPOFOL;  Surgeon: Toledo, Benay Pike, MD;   Location: [facility] ENDOSCOPY;  Service: Gastroenterology;  Laterality: N/A;  . CYSTOSCOPY    . lymphectomy    . THORACENTESIS       FAMILY HISTORY   Family History  Problem Relation Age of Onset  . Kidney disease Father 63  . Prostate cancer Neg Hx   . Bladder Cancer Neg Hx      SOCIAL HISTORY   Social History   Tobacco Use  . Smoking status: Former Smoker    Types: Cigarettes    Quit date: 05/01/2009    Years since quitting: 9.7  . Smokeless tobacco: Current User    Types: Snuff  Substance Use Topics  . Alcohol use: Yes    Alcohol/week: 8.0 standard drinks    Types: 1 Cans of beer, 3 Shots of liquor, 4 Standard drinks or equivalent per week    Comment: 2-3 times weekly  . Drug use: No     MEDICATIONS   Current Medication:  Current Facility-Administered Medications:  .  Chlorhexidine Gluconate Cloth 2 % PADS 6 each, 6 each, Topical, Daily, Amery, Sahar, MD .  dextrose 5 %-0.45 % sodium chloride infusion, , Intravenous, Continuous, Amery, Sahar, MD, Last Rate: 100 mL/hr at 02/05/19 0522, New Bag at 02/05/19 0522 .  enoxaparin (LOVENOX) injection 40 mg, 40 mg, Subcutaneous, Q24H, Shanlever, Pierce Crane, RPH, 40 mg at 40/98/11 9147 .  folic acid (FOLVITE) tablet 1 mg, 1 mg, Oral, Daily, Amery, Sahar, MD .  insulin aspart (novoLOG) injection 10 Units, 10 Units, Intravenous, Once, Nolberto Hanlon, MD .  lamoTRIgine (LAMICTAL) tablet 150 mg, 150 mg, Oral, BID, Amery, Sahar, MD .  levETIRAcetam (KEPPRA) 750 mg in sodium chloride  0.9 % 100 mL IVPB, 750 mg, Intravenous, Q12H, Amery, Sahar, MD, Last Rate: 430 mL/hr at 02/12/2019 2222, 750 mg at 02/16/2019 2222 .  morphine 2 MG/ML injection 2 mg, 2 mg, Intravenous, Q4H PRN, Lang Snow, NP, 2 mg at 02/05/19 0210 .  multivitamin with minerals tablet 1 tablet, 1 tablet, Oral, Daily, Kurtis Bushman, Sahar, MD .  piperacillin-tazobactam (ZOSYN) IVPB 3.375 g, 3.375 g, Intravenous, Once, Earleen Newport, MD .  sodium bicarbonate injection  50 mEq, 50 mEq, Intravenous, Once, Nolberto Hanlon, MD .  sodium chloride flush (NS) 0.9 % injection 3 mL, 3 mL, Intravenous, Q12H, Kurtis Bushman, Sahar, MD .  tamsulosin (FLOMAX) capsule 0.4 mg, 0.4 mg, Oral, Daily, Kurtis Bushman, Gwynneth Albright, MD .  thiamine (VITAMIN B-1) tablet 100 mg, 100 mg, Oral, Daily, Kurtis Bushman, Gwynneth Albright, MD .  vancomycin (VANCOCIN) IVPB 1000 mg/200 mL premix, 1,000 mg, Intravenous, Once, Earleen Newport, MD .  vitamin C (ASCORBIC ACID) tablet 500 mg, 500 mg, Oral, Daily, Kurtis Bushman, Sahar, MD .  zinc sulfate capsule 220 mg, 220 mg, Oral, Daily, Nolberto Hanlon, MD    ALLERGIES   Finasteride    REVIEW OF SYSTEMS     Unable to obtain   PHYSICAL EXAMINATION   Vital Signs: Temp:  [96.1 F (35.6 C)-98.6 F (37 C)] 98 F (36.7 C) (12/12 0200) Pulse Rate:  [44-113] 96 (12/12 0600) Resp:  [17-32] 32 (12/12 0600) BP: (94-126)/(60-107) 117/84 (12/12 0600) SpO2:  [88 %-100 %] 96 % (12/12 0600) Weight:  [86.2 kg] 86.2 kg (12/11 1329)  GENERAL:age appropriate chronically ill appearing HEAD: Normocephalic, atraumatic.  EYES: Pupils equal, round, reactive to light.  No scleral icterus.  MOUTH: Moist mucosal membrane. NECK: Supple. No thyromegaly. No nodules. No JVD.  PULMONARY: mild rhonchi bilaterally without wheezing. +R chest tube x 2  CARDIOVASCULAR: S1 and S2. Regular rate and rhythm. No murmurs, rubs, or gallops.  GASTROINTESTINAL: Soft, nontender, non-distended. No masses. Positive bowel sounds. No hepatosplenomegaly.  MUSCULOSKELETAL: No swelling, clubbing, or edema.  NEUROLOGIC: Mild distress due to acute illness SKIN:intact,warm,dry   PERTINENT DATA     Infusions: . dextrose 5 % and 0.45% NaCl 100 mL/hr at 02/05/19 0522  . levETIRAcetam 750 mg (02/23/2019 2222)  . piperacillin-tazobactam    . vancomycin     Scheduled Medications: . Chlorhexidine Gluconate Cloth  6 each Topical Daily  . enoxaparin (LOVENOX) injection  40 mg Subcutaneous Q24H  . folic acid  1 mg Oral Daily    . insulin aspart  10 Units Intravenous Once  . lamoTRIgine  150 mg Oral BID  . multivitamin with minerals  1 tablet Oral Daily  . sodium bicarbonate  50 mEq Intravenous Once  . sodium chloride flush  3 mL Intravenous Q12H  . tamsulosin  0.4 mg Oral Daily  . thiamine  100 mg Oral Daily  . vitamin C  500 mg Oral Daily  . zinc sulfate  220 mg Oral Daily   PRN Medications: morphine injection Hemodynamic parameters:   Intake/Output: 12/11 0701 - 12/12 0700 In: 134.2 [I.V.:134.2] Out: 300 [Urine:300]  Ventilator  Settings:     LAB RESULTS:  Basic Metabolic Panel: Recent Labs  Lab 01/28/2019 1330 02/18/2019 2042 02/05/19 0600  NA 149*  --  151*  K 5.4*  --  5.1  CL 116*  --  123*  CO2 17*  --  17*  GLUCOSE 128*  --  138*  BUN 97*  --  98*  CREATININE 3.95* 4.06* 3.77*  CALCIUM 9.8  --  8.8*   Liver Function Tests: Recent Labs  Lab 02/20/2019 1330  AST 16  ALT 12  ALKPHOS 97  BILITOT 1.2  PROT 8.0  ALBUMIN 3.4*   No results for input(s): LIPASE, AMYLASE in the last 168 hours. No results for input(s): AMMONIA in the last 168 hours. CBC: Recent Labs  Lab 02/18/2019 1330 02/12/2019 2042 02/05/19 0600  WBC 19.4* 17.3* 15.7*  NEUTROABS 16.6*  --   --   HGB 14.8 13.3 13.6  HCT 42.3 39.4 39.5  MCV 88.7 92.1 89.8  PLT 405* 352 331   Cardiac Enzymes: No results for input(s): CKTOTAL, CKMB, CKMBINDEX, TROPONINI in the last 168 hours. BNP: Invalid input(s): POCBNP CBG: Recent Labs  Lab 02/09/2019 2019  GLUCAP 113*     IMAGING RESULTS:  Imaging: DG Chest 1 View  Result Date: 01/25/2019 CLINICAL DATA:  Status post right chest tube for a right pneumothorax. EXAM: CHEST  1 VIEW COMPARISON:  02/18/2019 at 4:12 p.m. FINDINGS: New pigtail chest tube has placed, extending across the lower hemithorax, curled tip projecting in the medial right hemithorax 3-4 cm below the level of the carina. No change in the smaller caliber chest tube with its tip projecting over the right  upper lung. No residual right pneumothorax. Interstitial and hazy airspace lung opacities at the bases, most evident on the left, are unchanged from the earlier exam. No new lung abnormalities. IMPRESSION: 1. Re-expansion of the right lung following the placement of a second chest tube. No residual pneumothorax noted. Electronically Signed   By: Lajean Manes M.D.   On: 02/12/2019 17:24   DG Chest 1 View  Result Date: 02/20/2019 CLINICAL DATA:  Right pneumothorax. Chest tube placement. EXAM: CHEST  1 VIEW 4:30 p.m. COMPARISON:  Radiographs dated 02/01/2019 at 2:53 p.m. and 1:35 p.m. and 01/23/2019 FINDINGS: There has been a slight decrease in the pneumothorax at the right lung base. This is minimal. Chest tube is unchanged in position. No shift of the mediastinal structures to the left to suggest tension. Hazy infiltrate at the left base is slightly more prominent than on the prior study. Heart size and vascularity are normal. IMPRESSION: 1. Slight decrease in the large right pneumothorax at the right lung base. 2. Increased hazy infiltrate at the left base. 3. No change in the right chest tube. Electronically Signed   By: Lorriane Shire M.D.   On: 01/25/2019 16:47   DG Chest 1 View  Result Date: 02/21/2019 CLINICAL DATA:  Right chest tube placement for right pneumothorax. EXAM: CHEST  1 VIEW COMPARISON:  February 04, 2019 FINDINGS: Right chest tube is noted with tip in the right upper chest. Moderate right pneumothorax is unchanged. Patchy opacity noted in left lung base unchanged. The mediastinal contour and cardiac silhouetteare normal. IMPRESSION: Right chest tube is noted with tip in the right upper chest. Moderate right pneumothorax is unchanged. Patchy opacity noted in left lung base unchanged. Electronically Signed   By: Abelardo Diesel M.D.   On: 02/23/2019 15:22   DG Chest Port 1 View  Result Date: 01/27/2019 CLINICAL DATA:  Patient from [facility], sis called ems due to patient  feeling week and sob. Patient reports sob, sating low 80"s as per ems. Presents on 3lnc satings soft 90's in no obvious distress. Covid+, diagnosed on 11/29 EXAM: PORTABLE CHEST 1 VIEW COMPARISON:  01/23/2019 and older studies. FINDINGS: Moderate-sized right pneumothorax. Interstitial thickening is noted in the lower lungs, increased when compared to the prior chest  radiographs. Remainder of the lungs is clear. Cardiac silhouette is normal in size. No mediastinal or hilar masses. Skeletal structures are grossly intact. IMPRESSION: 1. Moderate-sized right pneumothorax. No evidence of tension pneumothorax. Critical Value/emergent results were called by telephone at the time of interpretation on 02/05/2019 at 2:06 pm to [facility] , who verbally acknowledged these results. 2. Thickened interstitial markings in the lung bases, increased from the prior studies. Interstitial infection or inflammation is suspected. Electronically Signed   By: Lajean Manes M.D.   On: 01/25/2019 14:07          ASSESSMENT AND PLAN      Acute Hypoxic Respiratory Failure -Admitted day 12 of COVID infection with abnormal CXR, hypoxemia and lethargy -s/p chest tube x 2 for neumothorax -starting Decadron 6 daily and remdesevir per protocol  -currenlty on 3L/min [place]  -surgery on case - appreciate input  -mental status still with confusion    Alcoholism   - CIWA protocol   - thiamine and folate repletion   -banana bag  - monitor for withdrawal     Chronic COPD without exacerbation   - continue COPD care path with nuonebs   - already on decadron 6 for covid and empiric abx    Acute on Chronic Renal Failure-most likely due to ATN -follow chem 7 -follow UO -continue Foley Catheter-assess need daily   ID -continue IV abx as prescibed -follow up cultures  GI/Nutrition GI PROPHYLAXIS as indicated DIET-->TF's as tolerated Constipation protocol as indicated  ENDO - ICU  hypoglycemic\Hyperglycemia protocol -check FSBS per protocol   ELECTROLYTES -follow labs as needed -replace as needed -pharmacy consultation   DVT/GI PRX ordered -SCDs  TRANSFUSIONS AS NEEDED MONITOR FSBS ASSESS the need for LABS as needed   Critical care provider statement:    Critical care time (minutes):  33   Critical care time was exclusive of:  Separately billable procedures and treating other patients   Critical care was necessary to treat or prevent imminent or life-threatening deterioration of the following conditions:  COVID19 infection, acute hypoxemic respiratory failure, acute on chronic renal failure   Critical care was time spent personally by me on the following activities:  Development of treatment plan with patient or surrogate, discussions with consultants, evaluation of patient's response to treatment, examination of patient, obtaining history from patient or surrogate, ordering and performing treatments and interventions, ordering and review of laboratory studies and re-evaluation of patient's condition.  I assumed direction of critical care for this patient from another provider in my specialty: no    This document was prepared using Dragon voice recognition software and may include unintentional dictation errors.    Ottie Glazier, M.D.  Division of [place]

## 2019-02-05 NOTE — Progress Notes (Signed)
Remdesivir - Pharmacy Brief Note   O:  ALT: 12 CXR: Moderate-sized right pneumothorax; interstitial infection or inflammation  SpO2: required O2 on presentation to ED  A/P:  Remdesivir 200 mg IVPB once followed by 100 mg IVPB daily x 4 days.    Rocky Morel 02/05/2019 10:13 AM

## 2019-02-05 NOTE — Progress Notes (Signed)
eLink Physician-Brief Progress Note Patient Name: Robert Weeks DOB: Jun 09, 1940 MRN: [id number]   Date of Service  02/05/2019  HPI/Events of Note  Reviewed CXR. Small residual R basilar pneumothorax. Likely slight decrease in size of pneumothorax relative to prior film. Chest tube position is similar.  eICU Interventions  No action at this time.     Intervention Category Intermediate Interventions: Diagnostic test evaluation  Marily Lente Latoya Diskin 02/05/2019, 11:41 PM

## 2019-02-05 NOTE — Progress Notes (Signed)
eLink Physician-Brief Progress Note Patient Name: Shemar Plemmons DOB: 1940-08-12 MRN: [id number]   Date of Service  02/05/2019  HPI/Events of Note  Patient is confused. May represent delirium vs EtOH withdrawal or component of both. Has mitten restraints on now but trying to take them off. Also somewhat more hypoxemic per RN, with concomitant HR 100-110 and RR in high 30s.  eICU Interventions  Will start Precedex for agitation. Also will get CXR to re-evaluate size of R pneumothorax given brisk air leak and worsened SpO2.     Intervention Category Major Interventions: Respiratory failure - evaluation and management;Delirium, psychosis, severe agitation - evaluation and management  Charlott Rakes 02/05/2019, 9:23 PM

## 2019-02-05 NOTE — Progress Notes (Addendum)
PHARMACY CONSULT NOTE - FOLLOW UP  Pharmacy Consult for Electrolyte Monitoring and Replacement   Recent Labs: Potassium (mmol/L)  Date Value  02/05/2019 5.1  02/09/2014 4.0   Magnesium (mg/dL)  Date Value  08/09/2012 1.5 (L)   Calcium (mg/dL)  Date Value  02/05/2019 8.8 (L)   Calcium, Total (mg/dL)  Date Value  02/09/2014 8.5   Albumin (g/dL)  Date Value  02/03/2019 3.4 (L)  02/09/2014 3.6   Sodium (mmol/L)  Date Value  02/05/2019 151 (H)  02/09/2014 141     Assessment: 78 year old male admitted with COVID. Patient's sodium elevated. Pharmacy to monitor electrolytes.  Goal of Therapy:  Electrolytes WNL  Correction of Na ~ 6 mEq/L in 24 hr  Plan:  Patient on D5 1/2 NS. Na trended up slightly, now 151. Per MD, switch to D5W and monitor Na q4h with correction goal as above. Will monitor to ensure Na does not correct too quickly.  No other electrolyte replacement indicated at this time. Will f/u BMP with am labs.  Tawnya Crook ,PharmD Clinical Pharmacist 02/05/2019 1:14 PM

## 2019-02-06 ENCOUNTER — Other Ambulatory Visit: Payer: Self-pay

## 2019-02-06 LAB — HEPATIC FUNCTION PANEL
ALT: 13 U/L (ref 0–44)
AST: 19 U/L (ref 15–41)
Albumin: 2.3 g/dL — ABNORMAL LOW (ref 3.5–5.0)
Alkaline Phosphatase: 67 U/L (ref 38–126)
Bilirubin, Direct: 0.1 mg/dL (ref 0.0–0.2)
Indirect Bilirubin: 0.3 mg/dL (ref 0.3–0.9)
Total Bilirubin: 0.4 mg/dL (ref 0.3–1.2)
Total Protein: 6 g/dL — ABNORMAL LOW (ref 6.5–8.1)

## 2019-02-06 LAB — SODIUM
Sodium: 148 mmol/L — ABNORMAL HIGH (ref 135–145)
Sodium: 149 mmol/L — ABNORMAL HIGH (ref 135–145)
Sodium: 150 mmol/L — ABNORMAL HIGH (ref 135–145)

## 2019-02-06 LAB — BASIC METABOLIC PANEL
Anion gap: 9 (ref 5–15)
BUN: 97 mg/dL — ABNORMAL HIGH (ref 8–23)
CO2: 17 mmol/L — ABNORMAL LOW (ref 22–32)
Calcium: 8.6 mg/dL — ABNORMAL LOW (ref 8.9–10.3)
Chloride: 126 mmol/L — ABNORMAL HIGH (ref 98–111)
Creatinine, Ser: 3.27 mg/dL — ABNORMAL HIGH (ref 0.61–1.24)
GFR calc Af Amer: 20 mL/min — ABNORMAL LOW (ref >60)
GFR calc non Af Amer: 17 mL/min — ABNORMAL LOW (ref >60)
Glucose, Bld: 167 mg/dL — ABNORMAL HIGH (ref 70–99)
Potassium: 5 mmol/L (ref 3.5–5.1)
Sodium: 152 mmol/L — ABNORMAL HIGH (ref 135–145)

## 2019-02-06 LAB — GLUCOSE, CAPILLARY: Glucose-Capillary: 137 mg/dL — ABNORMAL HIGH (ref 70–99)

## 2019-02-06 MED ORDER — LACTATED RINGERS IV BOLUS
500.0000 mL | Freq: Once | INTRAVENOUS | Status: AC
  Administered 2019-02-06: 500 mL via INTRAVENOUS

## 2019-02-06 MED ORDER — SODIUM CHLORIDE 0.9 % IV SOLN
250.0000 mL | INTRAVENOUS | Status: DC
  Administered 2019-02-06 – 2019-02-14 (×3): 250 mL via INTRAVENOUS

## 2019-02-06 MED ORDER — NOREPINEPHRINE BITARTRATE 1 MG/ML IV SOLN
2.0000 ug/min | INTRAVENOUS | Status: DC
  Filled 2019-02-06: qty 4

## 2019-02-06 NOTE — Progress Notes (Signed)
PHARMACY CONSULT NOTE - FOLLOW UP  Pharmacy Consult for Electrolyte Monitoring and Replacement   Recent Labs: Potassium (mmol/L)  Date Value  02/06/2019 5.0  02/09/2014 4.0   Magnesium (mg/dL)  Date Value  08/09/2012 1.5 (L)   Calcium (mg/dL)  Date Value  02/06/2019 8.6 (L)   Calcium, Total (mg/dL)  Date Value  02/09/2014 8.5   Albumin (g/dL)  Date Value  02/07/2019 3.4 (L)  02/09/2014 3.6   Sodium (mmol/L)  Date Value  02/06/2019 148 (H)  02/09/2014 141     Assessment: 78 year old male admitted with COVID. Patient's sodium elevated. Pharmacy to monitor electrolytes.  Goal of Therapy:  Electrolytes WNL  Correction of Na ~ 6 mEq/L in 24 hr  Plan:  Patient switched to D5W 12/12 for Na 151 trending up. Per MD, monitor Na q4h with correction goal as above. Na continues to trend down, now 148. Discussed with MD. Will continue D5W for now and decrease rate to 50 ml/hr. Na monitoring to q6h (next draw today at 1400).  No other electrolyte replacement indicated at this time. Will f/u BMP with am labs.  Tawnya Crook ,PharmD Clinical Pharmacist 02/06/2019 10:23 AM

## 2019-02-06 NOTE — Progress Notes (Signed)
PHARMACY CONSULT NOTE - FOLLOW UP  Pharmacy Consult for Electrolyte Monitoring and Replacement   Recent Labs: Potassium (mmol/L)  Date Value  02/06/2019 5.0  02/09/2014 4.0   Magnesium (mg/dL)  Date Value  08/09/2012 1.5 (L)   Calcium (mg/dL)  Date Value  02/06/2019 8.6 (L)   Calcium, Total (mg/dL)  Date Value  02/09/2014 8.5   Albumin (g/dL)  Date Value  02/01/2019 3.4 (L)  02/09/2014 3.6   Sodium (mmol/L)  Date Value  02/06/2019 152 (H)  02/09/2014 141   Assessment: 78 year old male admitted with COVID. Patient's sodium elevated. Pharmacy to monitor electrolytes.  Goal of Therapy:  Electrolytes WNL  Correction of Na ~ 6 mEq/L in 24 hr  Plan:  Patient on D5 1/2 NS. Na now 152 @ 0153.  Will monitor Na q4h with correction goal as above. Will monitor to ensure Na does not correct too quickly.  No other electrolyte replacement indicated at this time. Will f/u BMP with am labs.  Ena Dawley ,PharmD Clinical Pharmacist 02/06/2019 4:24 AM

## 2019-02-06 NOTE — Progress Notes (Signed)
Patient ID: Robert Weeks, male   DOB: 07-07-1940, 78 y.o.   MRN: [id number]  Chief Complaint  Patient presents with   Shortness of Breath    Referred By Dr. Lenise Arena Reason for Referral right-sided pneumothorax after small bore catheter placed  HPI Location, Quality, Duration, Severity, Timing, Context, Modifying Factors, Associated Signs and Symptoms.  Robert Weeks is a 78 y.o. male.  Follow-up after second small bore catheter was placed for persisting right-sided pneumothorax.  There continues to be a slight air leak, significantly diminished.  The patient remains combative.  Examination of the chest tube system did not reveal any obvious abnormalities.  He is Covid positive having tested positive approximately 12 days ago. Repeat CXR from last night is stable, there appears to be a small persistent PTX better than yesterday's AM chest x-ray.   Past Medical History:  Diagnosis Date   Asthma    BPH (benign prostatic hyperplasia)    Cancer ([facility])    lymphom dx in 2011 in remission   COPD (chronic obstructive pulmonary disease) ([facility])    Elevated PSA    Erectile dysfunction    Gout    Hypertension    Lymphoma ([place])    Seizures ([place])     Past Surgical History:  Procedure Laterality Date   COLONOSCOPY WITH PROPOFOL N/A 08/25/2014   Procedure: COLONOSCOPY WITH PROPOFOL;  Surgeon: Hulen Luster, MD;  Location[facility] ENDOSCOPY;  Service: Gastroenterology;  Laterality: N/A;   COLONOSCOPY WITH PROPOFOL N/A 10/04/2018   Procedure: COLONOSCOPY WITH PROPOFOL;  Surgeon: Toledo, Benay Pike, MD;  Location: [facility] ENDOSCOPY;  Service: Gastroenterology;  Laterality: N/A;   CYSTOSCOPY     lymphectomy     THORACENTESIS      Family History  Problem Relation Age of Onset   Kidney disease Father 57   Prostate cancer Neg Hx    Bladder Cancer Neg Hx     Social History Social History   Tobacco Use   Smoking status: Former Smoker    Types: Cigarettes    Quit date: 05/01/2009    Years  since quitting: 9.7   Smokeless tobacco: Current User    Types: Snuff  Substance Use Topics   Alcohol use: Yes    Alcohol/week: 8.0 standard drinks    Types: 1 Cans of beer, 3 Shots of liquor, 4 Standard drinks or equivalent per week    Comment: 2-3 times weekly   Drug use: No    Allergies  Allergen Reactions   Finasteride     Current Facility-Administered Medications  Medication Dose Route Frequency Provider Last Rate Last Admin   0.9 %  sodium chloride infusion  250 mL Intravenous Continuous Stretch, Marily Lente, MD 10 mL/hr at 02/06/19 0900 250 mL at 02/06/19 0900   azithromycin (ZITHROMAX) 500 mg in sodium chloride 0.9 % 250 mL IVPB  500 mg Intravenous Q24H Ottie Glazier, MD 250 mL/hr at 02/06/19 1101 500 mg at 02/06/19 1101   cefTRIAXone (ROCEPHIN) 1 g in sodium chloride 0.9 % 100 mL IVPB  1 g Intravenous Q24H Aleskerov, Fuad, MD 200 mL/hr at 02/06/19 1006 1 g at 02/06/19 1006   Chlorhexidine Gluconate Cloth 2 % PADS 6 each  6 each Topical Daily Nolberto Hanlon, MD   6 each at 02/06/19 1000   dexmedetomidine (PRECEDEX) 400 MCG/100ML (4 mcg/mL) infusion  0.4-1.2 mcg/kg/hr Intravenous Titrated Charlott Rakes, MD 10.78 mL/hr at 02/06/19 0626 0.5 mcg/kg/hr at 02/06/19 0626   dextrose 5 % solution   Intravenous Continuous  Ottie Glazier, MD 50 mL/hr at 02/06/19 1104 Rate Change at 74/12/87 8676   folic acid (FOLVITE) tablet 1 mg  1 mg Oral Daily Nolberto Hanlon, MD       heparin injection 5,000 Units  5,000 Units Subcutaneous Q8H Thornell Mule, MD   5,000 Units at 02/06/19 0534   insulin aspart (novoLOG) injection 10 Units  10 Units Intravenous Once Nolberto Hanlon, MD       lamoTRIgine (LAMICTAL) tablet 150 mg  150 mg Oral BID Nolberto Hanlon, MD       levETIRAcetam (KEPPRA) 750 mg in sodium chloride 0.9 % 100 mL IVPB  750 mg Intravenous Q12H Nolberto Hanlon, MD 430 mL/hr at 02/06/19 1035 750 mg at 02/06/19 1035   morphine 2 MG/ML injection 2 mg  2 mg Intravenous Q4H PRN Lang Snow,  NP   2 mg at 02/06/19 7209   multivitamin with minerals tablet 1 tablet  1 tablet Oral Daily Nolberto Hanlon, MD       norepinephrine (LEVOPHED) 4 mg in dextrose 5 % 250 mL (0.016 mg/mL) infusion  2-10 mcg/min Intravenous Titrated Stretch, Marily Lente, MD       remdesivir 100 mg in sodium chloride 0.9 % 100 mL IVPB  100 mg Intravenous Daily Rocky Morel, RPH       sodium bicarbonate injection 50 mEq  50 mEq Intravenous Once Nolberto Hanlon, MD       sodium chloride flush (NS) 0.9 % injection 3 mL  3 mL Intravenous Q12H Nolberto Hanlon, MD       tamsulosin (FLOMAX) capsule 0.4 mg  0.4 mg Oral Daily Nolberto Hanlon, MD       thiamine (VITAMIN B-1) tablet 100 mg  100 mg Oral Daily Nolberto Hanlon, MD       vitamin C (ASCORBIC ACID) tablet 500 mg  500 mg Oral Daily Nolberto Hanlon, MD       zinc sulfate capsule 220 mg  220 mg Oral Daily Nolberto Hanlon, MD          Blood pressure (!) 73/61, pulse 76, temperature 97.7 F (36.5 C), temperature source Axillary, resp. rate 14, height 5\' 10"  (1.778 m), weight 86.2 kg, SpO2 96 %.  Physical Exam CONSTITUTIONAL:  No acute distress, recently sedated.  RESPIRATORY:  Lungs were distant bilaterally.  Minimal right-sided crepitus present.  Minimally increased respiratory effort without pathologic use of accessory muscles of respiration. There is a chest tube in place on the right.  There is active air leak. CARDIOVASCULAR: Heart was regular. GI: The abdomen is soft, nontender, and not significantly distended.   Data Reviewed Chest x-rays: Images reviewed as noted above.  I have personally reviewed the patient's imaging, laboratory findings and medical records.    Assessment   diminished air leak.  no evidence of any remarkable compromise of respiratory status.     Plan   Surgery will continue CT management.  We will continue the patient on 20 cm of water suction.  We should repeat another chest x-ray tomorrow.       Ronny Bacon, MD 02/06/2019, 11:05 AM

## 2019-02-06 NOTE — Progress Notes (Signed)
[place] for Electrolyte Monitoring and Replacement   Recent Labs: Potassium (mmol/L)  Date Value  02/06/2019 5.0  02/09/2014 4.0   Magnesium (mg/dL)  Date Value  08/09/2012 1.5 (L)   Calcium (mg/dL)  Date Value  02/06/2019 8.6 (L)   Calcium, Total (mg/dL)  Date Value  02/09/2014 8.5   Albumin (g/dL)  Date Value  02/06/2019 2.3 (L)  02/09/2014 3.6   Sodium (mmol/L)  Date Value  02/06/2019 149 (H)  02/09/2014 141     Assessment: 78 year old male admitted with COVID. Patient's sodium elevated. Pharmacy to monitor electrolytes.  Goal of Therapy:  Electrolytes WNL  Correction of Na ~ 6 mEq/L in 24 hr  Plan:  Na previously trending down. Rate of D5W decreased to 50 ml/hr. Na monitoring to q6h.  12/13 1422 Na 149. Will continue to monitor. May have to adjust rate if Na continues to trend back up.  No other electrolyte replacement indicated at this time. Will f/u BMP with am labs.  Tawnya Crook ,PharmD Clinical Pharmacist 02/06/2019 3:25 PM

## 2019-02-06 NOTE — Progress Notes (Signed)
PHARMACY CONSULT NOTE - FOLLOW UP  Pharmacy Consult for Electrolyte Monitoring and Replacement   Recent Labs: Potassium (mmol/L)  Date Value  02/05/2019 5.1  02/09/2014 4.0   Magnesium (mg/dL)  Date Value  08/09/2012 1.5 (L)   Calcium (mg/dL)  Date Value  02/05/2019 8.8 (L)   Calcium, Total (mg/dL)  Date Value  02/09/2014 8.5   Albumin (g/dL)  Date Value  02/05/2019 3.4 (L)  02/09/2014 3.6   Sodium (mmol/L)  Date Value  02/05/2019 152 (H)  02/09/2014 141   Assessment: 78 year old male admitted with COVID. Patient's sodium elevated. Pharmacy to monitor electrolytes.  Goal of Therapy:  Electrolytes WNL  Correction of Na ~ 6 mEq/L in 24 hr  Plan:  Patient on D5 1/2 NS. Na now 152 @ 2225. Will monitor Na q4h with correction goal as above. Will monitor to ensure Na does not correct too quickly.  No other electrolyte replacement indicated at this time. Will f/u BMP with am labs.  Ena Dawley ,PharmD Clinical Pharmacist 02/06/2019 12:26 AM

## 2019-02-06 NOTE — Consult Note (Signed)
CRITICAL CARE PROGRESS NOTE    Name: Robert Weeks MRN: [id number] DOB: 1940/03/12     LOS: 2   SUBJECTIVE FINDINGS & SIGNIFICANT EVENTS   Patient description:  78 yo hx lymphoma, copd, htn, seizures, BIBEMS with AMS confused, family is +covid patient had contact with them and was diagnosed COVID+ on 01/23/19.  In ED he was hypoxemic but improved nasal canula at 3L/min.  Patient found to have R 4.5 cm pneumothorax, s/p chest tube 25F placement initially at Right with no re-expansion noted on post procedure CXR.  Following this a CT surgery consultation was placed and patient had second chest tube placed 14 F inferoposterior to first one and subsequent CXR confirmed re-expansion. Patient had chest tube placed on -20cm water suction. He was admitted to hospitalist service with critical care consultation placed for further management of above.    Lines / Drains: PIVx3  Cultures / Sepsis markers: -COVID+  Antibiotics: -Vanco/zosyn - stopped now on zithromax/rocephin -Remdesevir  12/13- remains on low dose precedex with good response for aggitation and withdrawal.   PAST MEDICAL HISTORY   Past Medical History:  Diagnosis Date  . Asthma   . BPH (benign prostatic hyperplasia)   . Cancer [facility])    lymphom dx in 2011 in remission  . COPD (chronic obstructive pulmonary disease) ([place])   . Elevated PSA   . Erectile dysfunction   . Gout   . Hypertension   . Lymphoma ([place])   . Seizures ([place])      SURGICAL HISTORY   Past Surgical History:  Procedure Laterality Date  . COLONOSCOPY WITH PROPOFOL N/A 08/25/2014   Procedure: COLONOSCOPY WITH PROPOFOL;  Surgeon: Hulen Luster, MD;  Location: [facility] ENDOSCOPY;  Service: Gastroenterology;  Laterality: N/A;  . COLONOSCOPY WITH PROPOFOL N/A 10/04/2018   Procedure:  COLONOSCOPY WITH PROPOFOL;  Surgeon: Toledo, Benay Pike, MD;  Location: [facility] ENDOSCOPY;  Service: Gastroenterology;  Laterality: N/A;  . CYSTOSCOPY    . lymphectomy    . THORACENTESIS       FAMILY HISTORY   Family History  Problem Relation Age of Onset  . Kidney disease Father 62  . Prostate cancer Neg Hx   . Bladder Cancer Neg Hx      SOCIAL HISTORY   Social History   Tobacco Use  . Smoking status: Former Smoker    Types: Cigarettes    Quit date: 05/01/2009    Years since quitting: 9.7  . Smokeless tobacco: Current User    Types: Snuff  Substance Use Topics  . Alcohol use: Yes    Alcohol/week: 8.0 standard drinks    Types: 1 Cans of beer, 3 Shots of liquor, 4 Standard drinks or equivalent per week    Comment: 2-3 times weekly  . Drug use: No     MEDICATIONS   Current Medication:  Current Facility-Administered Medications:  .  0.9 %  sodium chloride infusion, 250 mL, Intravenous, Continuous, Stretch, Marily Lente, MD, Last Rate: 10 mL/hr at 02/06/19 0900, 250 mL at 02/06/19 0900 .  azithromycin (ZITHROMAX) 500 mg in sodium chloride 0.9 % 250 mL IVPB, 500 mg, Intravenous, Q24H, Chetara Kropp, MD, Last Rate: 250 mL/hr at 02/06/19 1101, 500 mg at 02/06/19 1101 .  cefTRIAXone (ROCEPHIN) 1 g in sodium chloride 0.9 % 100 mL IVPB, 1 g, Intravenous, Q24H, Ethne Jeon, MD, Last Rate: 200 mL/hr at 02/06/19 1006, 1 g at 02/06/19 1006 .  Chlorhexidine Gluconate Cloth 2 % PADS 6 each, 6 each, Topical, Daily,  Nolberto Hanlon, MD, 6 each at 02/06/19 1000 .  dexmedetomidine (PRECEDEX) 400 MCG/100ML (4 mcg/mL) infusion, 0.4-1.2 mcg/kg/hr, Intravenous, Titrated, Stretch, Marily Lente, MD, Last Rate: 10.78 mL/hr at 02/06/19 0626, 0.5 mcg/kg/hr at 02/06/19 0626 .  dextrose 5 % solution, , Intravenous, Continuous, Jamelle Noy, MD, Last Rate: 50 mL/hr at 02/06/19 1104, Rate Change at 02/06/19 1104 .  folic acid (FOLVITE) tablet 1 mg, 1 mg, Oral, Daily, Amery, Sahar, MD .  heparin injection  5,000 Units, 5,000 Units, Subcutaneous, Q8H, Thornell Mule, MD, 5,000 Units at 02/06/19 0534 .  insulin aspart (novoLOG) injection 10 Units, 10 Units, Intravenous, Once, Nolberto Hanlon, MD .  lamoTRIgine (LAMICTAL) tablet 150 mg, 150 mg, Oral, BID, Kurtis Bushman, Sahar, MD .  levETIRAcetam (KEPPRA) 750 mg in sodium chloride 0.9 % 100 mL IVPB, 750 mg, Intravenous, Q12H, Amery, Sahar, MD, Last Rate: 430 mL/hr at 02/06/19 1035, 750 mg at 02/06/19 1035 .  morphine 2 MG/ML injection 2 mg, 2 mg, Intravenous, Q4H PRN, Lang Snow, NP, 2 mg at 02/06/19 0520 .  multivitamin with minerals tablet 1 tablet, 1 tablet, Oral, Daily, Amery, Sahar, MD .  norepinephrine (LEVOPHED) 4 mg in dextrose 5 % 250 mL (0.016 mg/mL) infusion, 2-10 mcg/min, Intravenous, Titrated, Stretch, Marily Lente, MD .  Margrett Rud remdesivir 200 mg in sodium chloride 0.9% 250 mL IVPB, 200 mg, Intravenous, Once, Stopped at 02/05/19 1230 **FOLLOWED BY** remdesivir 100 mg in sodium chloride 0.9 % 100 mL IVPB, 100 mg, Intravenous, Daily, Rocky Morel, RPH, Last Rate: 200 mL/hr at 02/06/19 1200, 100 mg at 02/06/19 1200 .  sodium bicarbonate injection 50 mEq, 50 mEq, Intravenous, Once, Amery, Sahar, MD .  sodium chloride flush (NS) 0.9 % injection 3 mL, 3 mL, Intravenous, Q12H, Amery, Sahar, MD .  tamsulosin (FLOMAX) capsule 0.4 mg, 0.4 mg, Oral, Daily, Kurtis Bushman, Sahar, MD .  thiamine (VITAMIN B-1) tablet 100 mg, 100 mg, Oral, Daily, Kurtis Bushman, Sahar, MD .  vitamin C (ASCORBIC ACID) tablet 500 mg, 500 mg, Oral, Daily, Kurtis Bushman, Sahar, MD .  zinc sulfate capsule 220 mg, 220 mg, Oral, Daily, Kurtis Bushman, Sahar, MD    ALLERGIES   Finasteride    REVIEW OF SYSTEMS     Unable to obtain   PHYSICAL EXAMINATION   Vital Signs: Temp:  [97.5 F (36.4 C)-97.8 F (36.6 C)] 97.7 F (36.5 C) (12/13 0800) Pulse Rate:  [72-111] 76 (12/13 0900) Resp:  [13-39] 14 (12/13 0900) BP: (67-122)/(57-83) 73/61 (12/13 0900) SpO2:  [91 %-100 %] 96 % (12/13  0900)  GENERAL:age appropriate chronically ill appearing HEAD: Normocephalic, atraumatic.  EYES: Pupils equal, round, reactive to light.  No scleral icterus.  MOUTH: Moist mucosal membrane. NECK: Supple. No thyromegaly. No nodules. No JVD.  PULMONARY: mild rhonchi bilaterally without wheezing. +R chest tube x 2  CARDIOVASCULAR: S1 and S2. Regular rate and rhythm. No murmurs, rubs, or gallops.  GASTROINTESTINAL: Soft, nontender, non-distended. No masses. Positive bowel sounds. No hepatosplenomegaly.  MUSCULOSKELETAL: No swelling, clubbing, or edema.  NEUROLOGIC: Mild distress due to acute illness SKIN:intact,warm,dry   PERTINENT DATA     Infusions: . sodium chloride 250 mL (02/06/19 0900)  . azithromycin 500 mg (02/06/19 1101)  . cefTRIAXone (ROCEPHIN)  IV 1 g (02/06/19 1006)  . dexmedetomidine (PRECEDEX) IV infusion 0.5 mcg/kg/hr (02/06/19 0626)  . dextrose 50 mL/hr at 02/06/19 1104  . levETIRAcetam 750 mg (02/06/19 1035)  . norepinephrine (LEVOPHED) Adult infusion    . remdesivir 100 mg in NS 100 mL 100 mg (02/06/19 1200)  Scheduled Medications: . Chlorhexidine Gluconate Cloth  6 each Topical Daily  . folic acid  1 mg Oral Daily  . heparin injection (subcutaneous)  5,000 Units Subcutaneous Q8H  . insulin aspart  10 Units Intravenous Once  . lamoTRIgine  150 mg Oral BID  . multivitamin with minerals  1 tablet Oral Daily  . sodium bicarbonate  50 mEq Intravenous Once  . sodium chloride flush  3 mL Intravenous Q12H  . tamsulosin  0.4 mg Oral Daily  . thiamine  100 mg Oral Daily  . vitamin C  500 mg Oral Daily  . zinc sulfate  220 mg Oral Daily   PRN Medications: morphine injection Hemodynamic parameters:   Intake/Output: 12/12 0701 - 12/13 0700 In: 2072.1 [I.V.:1503.4; IV Piggyback:568.7] Out: 5284 [Urine:1150; Chest Tube:15]  Ventilator  Settings:     LAB RESULTS:  Basic Metabolic Panel: Recent Labs  Lab 01/30/2019 1330 02/01/2019 2042 02/05/19 0600  02/05/19 1501 02/05/19 1835 02/05/19 2225 02/06/19 0153 02/06/19 0655  NA 149*  --  151* 154* 153* 152* 152* 148*  K 5.4*  --  5.1  --   --   --  5.0  --   CL 116*  --  123*  --   --   --  126*  --   CO2 17*  --  17*  --   --   --  17*  --   GLUCOSE 128*  --  138*  --   --   --  167*  --   BUN 97*  --  98*  --   --   --  97*  --   CREATININE 3.95* 4.06* 3.77*  --   --   --  3.27*  --   CALCIUM 9.8  --  8.8*  --   --   --  8.6*  --    Liver Function Tests: Recent Labs  Lab 02/21/2019 1330  AST 16  ALT 12  ALKPHOS 97  BILITOT 1.2  PROT 8.0  ALBUMIN 3.4*   No results for input(s): LIPASE, AMYLASE in the last 168 hours. No results for input(s): AMMONIA in the last 168 hours. CBC: Recent Labs  Lab 02/24/2019 1330 01/30/2019 2042 02/05/19 0600  WBC 19.4* 17.3* 15.7*  NEUTROABS 16.6*  --   --   HGB 14.8 13.3 13.6  HCT 42.3 39.4 39.5  MCV 88.7 92.1 89.8  PLT 405* 352 331   Cardiac Enzymes: No results for input(s): CKTOTAL, CKMB, CKMBINDEX, TROPONINI in the last 168 hours. BNP: Invalid input(s): POCBNP CBG: Recent Labs  Lab 02/13/2019 2019 02/06/19 0146  GLUCAP 113* 137*     IMAGING RESULTS:  Imaging: DG Chest 1 View  Result Date: 02/07/2019 CLINICAL DATA:  Status post right chest tube for a right pneumothorax. EXAM: CHEST  1 VIEW COMPARISON:  02/03/2019 at 4:12 p.m. FINDINGS: New pigtail chest tube has placed, extending across the lower hemithorax, curled tip projecting in the medial right hemithorax 3-4 cm below the level of the carina. No change in the smaller caliber chest tube with its tip projecting over the right upper lung. No residual right pneumothorax. Interstitial and hazy airspace lung opacities at the bases, most evident on the left, are unchanged from the earlier exam. No new lung abnormalities. IMPRESSION: 1. Re-expansion of the right lung following the placement of a second chest tube. No residual pneumothorax noted. Electronically Signed   By: Lajean Manes M.D.   On: 01/26/2019 17:24  DG Chest 1 View  Result Date: 02/03/2019 CLINICAL DATA:  Right pneumothorax. Chest tube placement. EXAM: CHEST  1 VIEW 4:30 p.m. COMPARISON:  Radiographs dated 02/10/2019 at 2:53 p.m. and 1:35 p.m. and 01/23/2019 FINDINGS: There has been a slight decrease in the pneumothorax at the right lung base. This is minimal. Chest tube is unchanged in position. No shift of the mediastinal structures to the left to suggest tension. Hazy infiltrate at the left base is slightly more prominent than on the prior study. Heart size and vascularity are normal. IMPRESSION: 1. Slight decrease in the large right pneumothorax at the right lung base. 2. Increased hazy infiltrate at the left base. 3. No change in the right chest tube. Electronically Signed   By: Lorriane Shire M.D.   On: 02/03/2019 16:47   DG Chest 1 View  Result Date: 02/14/2019 CLINICAL DATA:  Right chest tube placement for right pneumothorax. EXAM: CHEST  1 VIEW COMPARISON:  February 04, 2019 FINDINGS: Right chest tube is noted with tip in the right upper chest. Moderate right pneumothorax is unchanged. Patchy opacity noted in left lung base unchanged. The mediastinal contour and cardiac silhouetteare normal. IMPRESSION: Right chest tube is noted with tip in the right upper chest. Moderate right pneumothorax is unchanged. Patchy opacity noted in left lung base unchanged. Electronically Signed   By: Abelardo Diesel M.D.   On: 02/24/2019 15:22   DG Chest Port 1 View  Result Date: 02/05/2019 CLINICAL DATA:  Pneumothorax EXAM: PORTABLE CHEST 1 VIEW COMPARISON:  February 05, 2019 FINDINGS: There is a right-sided chest tube in place. There is a persistent small right-sided pneumothorax. There is a persistent opacity at the left lung base. The heart size is unchanged from prior study. There is no left-sided pneumothorax. There is no displaced fracture. IMPRESSION: 1. Stable small right-sided pneumothorax. Unchanged positioning  of the right-sided chest tube. 2. Persistent opacity at the left lung base. Electronically Signed   By: Constance Holster M.D.   On: 02/05/2019 22:12   DG CHEST PORT 1 VIEW  Result Date: 02/05/2019 CLINICAL DATA:  78 year old male with right chest tube for pneumothorax. Positive COVID-19 last my. EXAM: PORTABLE CHEST 1 VIEW COMPARISON:  02/23/2019 and earlier. FINDINGS: Portable AP upright views at 1026 hours. Pigtail right chest tube courses to the midline as before. However, recurrent small pneumothorax is visible at the right lung base today. Pleural edge is best seen at the lateral right 7th, 8th rib level. Stable lung volumes and mediastinal contours. No confluent opacity on the right. Patchy left lung base opacity has not significantly changed. Visualized tracheal air column is within normal limits. Negative visible bowel gas pattern. IMPRESSION: 1. Recurrent small right basilar pneumothorax. Right chest tube remains in place. 2. Stable patchy left lung base opacity. Electronically Signed   By: Genevie Ann M.D.   On: 02/05/2019 12:29   DG Chest Port 1 View  Result Date: 02/21/2019 CLINICAL DATA:  Patient from [facility], sis called ems due to patient feeling week and sob. Patient reports sob, sating low 80"s as per ems. Presents on 3lnc satings soft 90's in no obvious distress. Covid+, diagnosed on 11/29 EXAM: PORTABLE CHEST 1 VIEW COMPARISON:  01/23/2019 and older studies. FINDINGS: Moderate-sized right pneumothorax. Interstitial thickening is noted in the lower lungs, increased when compared to the prior chest radiographs. Remainder of the lungs is clear. Cardiac silhouette is normal in size. No mediastinal or hilar masses. Skeletal structures are grossly intact. IMPRESSION: 1. Moderate-sized  right pneumothorax. No evidence of tension pneumothorax. Critical Value/emergent results were called by telephone at the time of interpretation on 02/14/2019 at 2:06 pm to [facility] ,  who verbally acknowledged these results. 2. Thickened interstitial markings in the lung bases, increased from the prior studies. Interstitial infection or inflammation is suspected. Electronically Signed   By: Lajean Manes M.D.   On: 02/13/2019 14:07              ASSESSMENT AND PLAN      Acute Hypoxic Respiratory Failure -Admitted day 12 of COVID infection with abnormal CXR, hypoxemia and lethargy -currently on day 2 remdesevir -s/p chest tube x 2 for neumothorax -starting Decadron 6 daily and remdesevir per protocol  -currenlty on 3L/min [place]  -surgery on case - appreciate input  -mental status still with confusion  -hepatic function panel today    Alcoholism   - CIWA protocol   - thiamine and folate repletion   -banana bag  - monitor for withdrawal     Chronic COPD without exacerbation   - continue COPD care path with nuonebs   - already on decadron 6 for covid and empiric abx    Acute on Chronic Renal Failure-most likely due to ATN -follow chem 7 -follow UO -continue Foley Catheter-assess need daily -improved GFR this am     ID -continue IV abx as prescibed -follow up cultures  GI/Nutrition GI PROPHYLAXIS as indicated DIET-->TF's as tolerated Constipation protocol as indicated  ENDO - ICU hypoglycemic\Hyperglycemia protocol -check FSBS per protocol   ELECTROLYTES -follow labs as needed -replace as needed -pharmacy consultation   DVT/GI PRX ordered -SCDs  TRANSFUSIONS AS NEEDED MONITOR FSBS ASSESS the need for LABS as needed   Critical care provider statement:    Critical care time (minutes):  33   Critical care time was exclusive of:  Separately billable procedures and treating other patients   Critical care was necessary to treat or prevent imminent or life-threatening deterioration of the following conditions:  COVID19 infection, acute hypoxemic respiratory failure, acute on chronic renal failure   Critical care was time spent  personally by me on the following activities:  Development of treatment plan with patient or surrogate, discussions with consultants, evaluation of patient's response to treatment, examination of patient, obtaining history from patient or surrogate, ordering and performing treatments and interventions, ordering and review of laboratory studies and re-evaluation of patient's condition.  I assumed direction of critical care for this patient from another provider in my specialty: no    This document was prepared using Dragon voice recognition software and may include unintentional dictation errors.    Ottie Glazier, M.D.  Division of [place]

## 2019-02-06 NOTE — Progress Notes (Signed)
Attempted to do patients bath patient refused and became agitated . Patient began pulling and grabbing at wires while being uncooperative. CHG wipe down given will continue to monitor patient.

## 2019-02-06 NOTE — Progress Notes (Signed)
eLink Physician-Brief Progress Note Patient Name: Robert Weeks DOB: 1940-11-16 MRN: [id number]   Date of Service  02/06/2019  HPI/Events of Note  Hypotension to 67/57 (MAP 65).  eICU Interventions  Will give 500cc LR. Will also order peripheral levophed infusion in case he does not respond. Will need to be cautious with IVF administration given COVID+ and risk of ARDS.     Intervention Category Major Interventions: Hypotension - evaluation and management  Marily Lente Dozier Berkovich 02/06/2019, 2:01 AM

## 2019-02-06 NOTE — Progress Notes (Signed)
CRITICAL VALUE ALERT  Critical Value:  EKG  Date & Time Notied: 0840  Provider Notified: DR. Dionisio David  Orders Received/Actions taken: None at this time

## 2019-02-07 ENCOUNTER — Inpatient Hospital Stay: Payer: Medicare Other

## 2019-02-07 DIAGNOSIS — J930 Spontaneous tension pneumothorax: Secondary | ICD-10-CM

## 2019-02-07 LAB — BASIC METABOLIC PANEL
Anion gap: 8 (ref 5–15)
BUN: 84 mg/dL — ABNORMAL HIGH (ref 8–23)
CO2: 18 mmol/L — ABNORMAL LOW (ref 22–32)
Calcium: 8.6 mg/dL — ABNORMAL LOW (ref 8.9–10.3)
Chloride: 124 mmol/L — ABNORMAL HIGH (ref 98–111)
Creatinine, Ser: 2.83 mg/dL — ABNORMAL HIGH (ref 0.61–1.24)
GFR calc Af Amer: 23 mL/min — ABNORMAL LOW (ref >60)
GFR calc non Af Amer: 20 mL/min — ABNORMAL LOW (ref >60)
Glucose, Bld: 129 mg/dL — ABNORMAL HIGH (ref 70–99)
Potassium: 5.6 mmol/L — ABNORMAL HIGH (ref 3.5–5.1)
Sodium: 150 mmol/L — ABNORMAL HIGH (ref 135–145)

## 2019-02-07 LAB — SODIUM
Sodium: 151 mmol/L — ABNORMAL HIGH (ref 135–145)
Sodium: 153 mmol/L — ABNORMAL HIGH (ref 135–145)
Sodium: 152 mmol/L — ABNORMAL HIGH (ref 135–145)

## 2019-02-07 NOTE — Progress Notes (Signed)
[facility] for Electrolyte Monitoring and Replacement   Recent Labs: Potassium (mmol/L)  Date Value  02/07/2019 5.6 (H)  02/09/2014 4.0   Magnesium (mg/dL)  Date Value  08/09/2012 1.5 (L)   Calcium (mg/dL)  Date Value  02/07/2019 8.6 (L)   Calcium, Total (mg/dL)  Date Value  02/09/2014 8.5   Albumin (g/dL)  Date Value  02/06/2019 2.3 (L)  02/09/2014 3.6   Sodium (mmol/L)  Date Value  02/07/2019 153 (H)  02/09/2014 141     Assessment: 78 year old male admitted with COVID. Patient's sodium elevated. Pharmacy to monitor electrolytes.  Goal of Therapy:  Electrolytes WNL  Correction of Na ~ 6 mEq/L in 24 hr  Plan:  D5 increased to 175mL/hr.   Electrolytes with am labs.   Pharmacy will continue to monitor and adjust per consult.   Gayle Martinez L  02/07/2019 6:06 PM

## 2019-02-07 NOTE — Progress Notes (Signed)
Patient with small but near continuous air leak.  Independent review of his chest xray perhaps shows small pneumothorax  I decreased the suction to 10 cm of water and the air leak is now only with expiration  Will leave chest tube to 10 cm of water suction for now.    [employer].

## 2019-02-07 NOTE — Progress Notes (Signed)
[facility] for Electrolyte Monitoring and Replacement   Recent Labs: Potassium (mmol/L)  Date Value  02/07/2019 5.6 (H)  02/09/2014 4.0   Magnesium (mg/dL)  Date Value  08/09/2012 1.5 (L)   Calcium (mg/dL)  Date Value  02/07/2019 8.6 (L)   Calcium, Total (mg/dL)  Date Value  02/09/2014 8.5   Albumin (g/dL)  Date Value  02/06/2019 2.3 (L)  02/09/2014 3.6   Sodium (mmol/L)  Date Value  02/07/2019 150 (H)  02/09/2014 141   Assessment: 78 year old male admitted with COVID. Patient's sodium elevated. Pharmacy to monitor electrolytes.  Goal of Therapy:  Electrolytes WNL  Correction of Na ~ 6 mEq/L in 24 hr  Plan:  Na previously trending down. Rate of D5W decreased to 50 ml/hr. Na monitoring to q6h.  12/13 1422 Na 149. Will continue to monitor. May have to adjust rate if Na continues to trend back up. 12/13 @ 2007 Na 150 12/14 @ 0200 Na 150   No other electrolyte replacement indicated at this time. Will f/u BMP with am labs.  Ena Dawley ,PharmD Clinical Pharmacist 02/07/2019 2:47 AM

## 2019-02-07 NOTE — Consult Note (Signed)
CRITICAL CARE PROGRESS NOTE    Name: Robert Weeks MRN: [id number] DOB: [birth year]     LOS: 3   SUBJECTIVE FINDINGS & SIGNIFICANT EVENTS   Patient description:  78 yo hx lymphoma, copd, htn, seizures, BIBEMS with AMS confused, family is +covid patient had contact with them and was diagnosed COVID+ on 01/23/19.  In ED he was hypoxemic but improved nasal canula at 3L/min.  Patient found to have R 4.5 cm pneumothorax, s/p chest tube 74F placement initially at Right with no re-expansion noted on post procedure CXR.  Following this a CT surgery consultation was placed and patient had second chest tube placed 14 F inferoposterior to first one and subsequent CXR confirmed re-expansion. Patient had chest tube placed on -20cm water suction. He was admitted to hospitalist service with critical care consultation placed for further management of above.    Lines / Drains: PIVx3  Cultures / Sepsis markers: -COVID+  Antibiotics: -Vanco/zosyn - stopped now on zithromax/rocephin -Remdesevir  12/13- remains on low dose precedex with good response for aggitation and withdrawal. 12/14 Chest tube in place PTX,     CC follow up resp failure HPI Remains encephalopathic Remains on precedex Chest tube in place Small PTX remains, air leak noted  COVID-19 DISASTER DECLARATION:   FULL CONTACT PHYSICAL EXAMINATION WAS NOT POSSIBLE DUE TO TREATMENT OF COVID-19 AND   CONSERVATION OF PERSONAL PROTECTIVE EQUIPMENT, LIMITED EXAM FINDINGS INCLUDE-   Patient assessed or the symptoms described in the history of present illness.   In the context of the Global COVID-19 pandemic, which necessitated consideration that the patient might be at risk for infection with the SARS-CoV-2 virus that causes COVID-19, Institutional protocols and  algorithms that pertain to the evaluation of patients at risk for COVID-19 are in a state of rapid change based on information released by regulatory bodies including the CDC and federal and state organizations. These policies and algorithms were followed during the patient's care while in hospital.     PAST MEDICAL HISTORY   Past Medical History:  Diagnosis Date  . Asthma   . BPH (benign prostatic hyperplasia)   . Cancer [facility])    lymphom dx in 2011 in remission  . COPD (chronic obstructive pulmonary disease) ([place])   . Elevated PSA   . Erectile dysfunction   . Gout   . Hypertension   . Lymphoma ([place])   . Seizures ([place])      SURGICAL HISTORY   Past Surgical History:  Procedure Laterality Date  . COLONOSCOPY WITH PROPOFOL N/A 08/25/2014   Procedure: COLONOSCOPY WITH PROPOFOL;  Surgeon: Hulen Luster, MD;  Location: [facility] ENDOSCOPY;  Service: Gastroenterology;  Laterality: N/A;  . COLONOSCOPY WITH PROPOFOL N/A 10/04/2018   Procedure: COLONOSCOPY WITH PROPOFOL;  Surgeon: Toledo, Benay Pike, MD;  Location: [facility] ENDOSCOPY;  Service: Gastroenterology;  Laterality: N/A;  . CYSTOSCOPY    . lymphectomy    . THORACENTESIS       FAMILY HISTORY   Family History  Problem Relation Age of Onset  . Kidney disease Father 42  . Prostate cancer Neg Hx   . Bladder Cancer Neg Hx      SOCIAL HISTORY   Social History   Tobacco Use  . Smoking status: Former Smoker    Types: Cigarettes    Quit date: 05/01/2009    Years since quitting: 9.7  . Smokeless tobacco: Current User    Types: Snuff  Substance Use Topics  . Alcohol use: Yes    Alcohol/week:  8.0 standard drinks    Types: 1 Cans of beer, 3 Shots of liquor, 4 Standard drinks or equivalent per week    Comment: 2-3 times weekly  . Drug use: No     MEDICATIONS   Current Medication:  Current Facility-Administered Medications:  .  0.9 %  sodium chloride infusion, 250 mL, Intravenous, Continuous, Stretch, Marily Lente, MD, Last Rate: 10  mL/hr at 02/07/19 0600, Rate Verify at 02/07/19 0600 .  azithromycin (ZITHROMAX) 500 mg in sodium chloride 0.9 % 250 mL IVPB, 500 mg, Intravenous, Q24H, Aleskerov, Fuad, MD, Last Rate: 250 mL/hr at 02/07/19 1009, 500 mg at 02/07/19 1009 .  cefTRIAXone (ROCEPHIN) 1 g in sodium chloride 0.9 % 100 mL IVPB, 1 g, Intravenous, Q24H, Aleskerov, Fuad, MD, Last Rate: 200 mL/hr at 02/07/19 0938, 1 g at 02/07/19 0938 .  Chlorhexidine Gluconate Cloth 2 % PADS 6 each, 6 each, Topical, Daily, Nolberto Hanlon, MD, 6 each at 02/07/19 [phone] .  dexmedetomidine (PRECEDEX) 400 MCG/100ML (4 mcg/mL) infusion, 0.4-1.2 mcg/kg/hr, Intravenous, Titrated, Stretch, Marily Lente, MD, Last Rate: 8.62 mL/hr at 02/07/19 0900, 0.4 mcg/kg/hr at 02/07/19 0900 .  dextrose 5 % solution, , Intravenous, Continuous, Carel Schnee, MD, Last Rate: 50 mL/hr at 02/07/19 0600, Rate Verify at 02/07/19 0600 .  folic acid (FOLVITE) tablet 1 mg, 1 mg, Oral, Daily, Amery, Sahar, MD .  heparin injection 5,000 Units, 5,000 Units, Subcutaneous, Q8H, Thornell Mule, MD, 5,000 Units at 02/07/19 0615 .  insulin aspart (novoLOG) injection 10 Units, 10 Units, Intravenous, Once, Nolberto Hanlon, MD .  lamoTRIgine (LAMICTAL) tablet 150 mg, 150 mg, Oral, BID, Kurtis Bushman, Sahar, MD .  levETIRAcetam (KEPPRA) 750 mg in sodium chloride 0.9 % 100 mL IVPB, 750 mg, Intravenous, Q12H, Amery, Sahar, MD, Last Rate: 430 mL/hr at 02/07/19 0935, 750 mg at 02/07/19 0935 .  morphine 2 MG/ML injection 2 mg, 2 mg, Intravenous, Q4H PRN, Lang Snow, NP, 2 mg at 02/06/19 0520 .  multivitamin with minerals tablet 1 tablet, 1 tablet, Oral, Daily, Amery, Sahar, MD .  sodium chloride flush (NS) 0.9 % injection 3 mL, 3 mL, Intravenous, Q12H, Kurtis Bushman, Sahar, MD, 3 mL at 02/07/19 0940 .  tamsulosin (FLOMAX) capsule 0.4 mg, 0.4 mg, Oral, Daily, Kurtis Bushman, Sahar, MD .  thiamine (VITAMIN B-1) tablet 100 mg, 100 mg, Oral, Daily, Kurtis Bushman, Sahar, MD .  vitamin C (ASCORBIC ACID) tablet 500 mg, 500 mg,  Oral, Daily, Kurtis Bushman, Sahar, MD .  zinc sulfate capsule 220 mg, 220 mg, Oral, Daily, Kurtis Bushman, Sahar, MD    ALLERGIES   Finasteride    REVIEW OF SYSTEMS     Unable to obtain   PHYSICAL EXAMINATION   Vital Signs: Temp:  [97.6 F (36.4 C)-98.6 F (37 C)] 98.6 F (37 C) (12/14 1200) Pulse Rate:  [52-94] 61 (12/14 1200) Resp:  [12-29] 20 (12/14 1200) BP: (78-116)/(61-87) 104/77 (12/14 1200) SpO2:  [84 %-100 %] 95 % (12/14 1200)     PERTINENT DATA     Infusions: . sodium chloride 10 mL/hr at 02/07/19 0600  . azithromycin 500 mg (02/07/19 1009)  . cefTRIAXone (ROCEPHIN)  IV 1 g (02/07/19 1191)  . dexmedetomidine (PRECEDEX) IV infusion 0.4 mcg/kg/hr (02/07/19 0900)  . dextrose 50 mL/hr at 02/07/19 0600  . levETIRAcetam 750 mg (02/07/19 0935)   Scheduled Medications: . Chlorhexidine Gluconate Cloth  6 each Topical Daily  . folic acid  1 mg Oral Daily  . heparin injection (subcutaneous)  5,000 Units Subcutaneous Q8H  . insulin aspart  10 Units Intravenous Once  . lamoTRIgine  150 mg Oral BID  . multivitamin with minerals  1 tablet Oral Daily  . sodium chloride flush  3 mL Intravenous Q12H  . tamsulosin  0.4 mg Oral Daily  . thiamine  100 mg Oral Daily  . vitamin C  500 mg Oral Daily  . zinc sulfate  220 mg Oral Daily   PRN Medications: morphine injection Hemodynamic parameters:   Intake/Output: 12/13 0701 - 12/14 0700 In: 2431.7 [I.V.:1845.9; IV Piggyback:585.8] Out: 6045 [WUJWJ:1914]  Ventilator  Settings:     LAB RESULTS:  Basic Metabolic Panel: Recent Labs  Lab 02/01/2019 1330 02/23/2019 2042 02/05/19 0600 02/06/19 0153 02/06/19 0655 02/06/19 1422 02/06/19 2007 02/07/19 0201 02/07/19 0753  NA 149*  --  151* 152* 148* 149* 150* 150* 151*  K 5.4*  --  5.1 5.0  --   --   --  5.6*  --   CL 116*  --  123* 126*  --   --   --  124*  --   CO2 17*  --  17* 17*  --   --   --  18*  --   GLUCOSE 128*  --  138* 167*  --   --   --  129*  --   BUN 97*  --   98* 97*  --   --   --  84*  --   CREATININE 3.95* 4.06* 3.77* 3.27*  --   --   --  2.83*  --   CALCIUM 9.8  --  8.8* 8.6*  --   --   --  8.6*  --    Liver Function Tests: Recent Labs  Lab 02/14/2019 1330 02/06/19 1422  AST 16 19  ALT 12 13  ALKPHOS 97 67  BILITOT 1.2 0.4  PROT 8.0 6.0*  ALBUMIN 3.4* 2.3*   No results for input(s): LIPASE, AMYLASE in the last 168 hours. No results for input(s): AMMONIA in the last 168 hours. CBC: Recent Labs  Lab 01/28/2019 1330 02/01/2019 2042 02/05/19 0600  WBC 19.4* 17.3* 15.7*  NEUTROABS 16.6*  --   --   HGB 14.8 13.3 13.6  HCT 42.3 39.4 39.5  MCV 88.7 92.1 89.8  PLT 405* 352 331   Cardiac Enzymes: No results for input(s): CKTOTAL, CKMB, CKMBINDEX, TROPONINI in the last 168 hours. BNP: Invalid input(s): POCBNP CBG: Recent Labs  Lab 02/08/2019 2019 02/06/19 0146  GLUCAP 113* 137*     IMAGING RESULTS:  Imaging: DG CHEST PORT 1 VIEW  Result Date: 02/07/2019 CLINICAL DATA:  Pneumothorax EXAM: PORTABLE CHEST 1 VIEW COMPARISON:  02/05/2019 FINDINGS: Right chest tube is again noted. Similar small right pneumothorax. Similar patchy density at the lung bases and trace pleural effusions. Stable cardiomediastinal contours. IMPRESSION: Similar small right pneumothorax with chest tube unchanged in position. Similar patchy bibasilar opacities and trace pleural effusions. Electronically Signed   By: Macy Mis M.D.   On: 02/07/2019 08:35   DG Chest Port 1 View  Result Date: 02/05/2019 CLINICAL DATA:  Pneumothorax EXAM: PORTABLE CHEST 1 VIEW COMPARISON:  February 05, 2019 FINDINGS: There is a right-sided chest tube in place. There is a persistent small right-sided pneumothorax. There is a persistent opacity at the left lung base. The heart size is unchanged from prior study. There is no left-sided pneumothorax. There is no displaced fracture. IMPRESSION: 1. Stable small right-sided pneumothorax. Unchanged positioning of the right-sided chest  tube. 2. Persistent opacity at the left  lung base. Electronically Signed   By: Constance Holster M.D.   On: 02/05/2019 22:12     ASSESSMENT AND PLAN    SEVERE COPD EXACERBATION -continue IV steroids as prescribed -morphine as needed -wean fio2 as needed and tolerated   COVID 19 infection pneumonia Oxygen as needed Continue IV abx   SEVERE ALCOHOL WITHDRAWAL -Therapy with Thiamine and MVI -CIWA Protocol -Precedex as needed -High risk for intubation  -high risk for aspiration  ELECTROLYTES -follow labs as needed -replace as needed -pharmacy consultation and following    DVT/GI PRX ordered TRANSFUSIONS AS NEEDED MONITOR FSBS ASSESS the need for LABS as needed  Remains SD status  Corrin Parker, M.D.  [facility]  Medical Director [place] Director [facility] Cardio-Pulmonary Department

## 2019-02-08 ENCOUNTER — Inpatient Hospital Stay: Payer: Medicare Other

## 2019-02-08 LAB — CBC WITH DIFFERENTIAL/PLATELET
Abs Immature Granulocytes: 0.21 10*3/uL — ABNORMAL HIGH (ref 0.00–0.07)
Basophils Absolute: 0 10*3/uL (ref 0.0–0.1)
Basophils Relative: 0 %
Eosinophils Absolute: 0 10*3/uL (ref 0.0–0.5)
Eosinophils Relative: 0 %
HCT: 35.3 % — ABNORMAL LOW (ref 39.0–52.0)
Hemoglobin: 12.2 g/dL — ABNORMAL LOW (ref 13.0–17.0)
Immature Granulocytes: 1 %
Lymphocytes Relative: 4 %
Lymphs Abs: 0.6 10*3/uL — ABNORMAL LOW (ref 0.7–4.0)
MCH: 30.7 pg (ref 26.0–34.0)
MCHC: 34.6 g/dL (ref 30.0–36.0)
MCV: 88.9 fL (ref 80.0–100.0)
Monocytes Absolute: 1.5 10*3/uL — ABNORMAL HIGH (ref 0.1–1.0)
Monocytes Relative: 10 %
Neutro Abs: 12.4 10*3/uL — ABNORMAL HIGH (ref 1.7–7.7)
Neutrophils Relative %: 85 %
Platelets: 212 10*3/uL (ref 150–400)
RBC: 3.97 MIL/uL — ABNORMAL LOW (ref 4.22–5.81)
RDW: 14.8 % (ref 11.5–15.5)
WBC: 14.8 10*3/uL — ABNORMAL HIGH (ref 4.0–10.5)
nRBC: 0 % (ref 0.0–0.2)

## 2019-02-08 LAB — COMPREHENSIVE METABOLIC PANEL
ALT: 13 U/L (ref 0–44)
AST: 18 U/L (ref 15–41)
Albumin: 2.2 g/dL — ABNORMAL LOW (ref 3.5–5.0)
Alkaline Phosphatase: 70 U/L (ref 38–126)
Anion gap: 7 (ref 5–15)
BUN: 72 mg/dL — ABNORMAL HIGH (ref 8–23)
CO2: 18 mmol/L — ABNORMAL LOW (ref 22–32)
Calcium: 8.3 mg/dL — ABNORMAL LOW (ref 8.9–10.3)
Chloride: 123 mmol/L — ABNORMAL HIGH (ref 98–111)
Creatinine, Ser: 2.42 mg/dL — ABNORMAL HIGH (ref 0.61–1.24)
GFR calc Af Amer: 28 mL/min — ABNORMAL LOW (ref >60)
GFR calc non Af Amer: 24 mL/min — ABNORMAL LOW (ref >60)
Glucose, Bld: 127 mg/dL — ABNORMAL HIGH (ref 70–99)
Potassium: 5 mmol/L (ref 3.5–5.1)
Sodium: 148 mmol/L — ABNORMAL HIGH (ref 135–145)
Total Bilirubin: 1 mg/dL (ref 0.3–1.2)
Total Protein: 5.6 g/dL — ABNORMAL LOW (ref 6.5–8.1)

## 2019-02-08 LAB — AMMONIA: Ammonia: <9 umol/L — ABNORMAL LOW (ref 9–35)

## 2019-02-08 LAB — MAGNESIUM: Magnesium: 2.5 mg/dL — ABNORMAL HIGH (ref 1.7–2.4)

## 2019-02-08 LAB — BASIC METABOLIC PANEL
Anion gap: 6 (ref 5–15)
BUN: 66 mg/dL — ABNORMAL HIGH (ref 8–23)
CO2: 18 mmol/L — ABNORMAL LOW (ref 22–32)
Calcium: 8.3 mg/dL — ABNORMAL LOW (ref 8.9–10.3)
Chloride: 124 mmol/L — ABNORMAL HIGH (ref 98–111)
Creatinine, Ser: 2.13 mg/dL — ABNORMAL HIGH (ref 0.61–1.24)
GFR calc Af Amer: 33 mL/min — ABNORMAL LOW (ref >60)
GFR calc non Af Amer: 28 mL/min — ABNORMAL LOW (ref >60)
Glucose, Bld: 141 mg/dL — ABNORMAL HIGH (ref 70–99)
Potassium: 4.6 mmol/L (ref 3.5–5.1)
Sodium: 148 mmol/L — ABNORMAL HIGH (ref 135–145)

## 2019-02-08 LAB — SODIUM: Sodium: 150 mmol/L — ABNORMAL HIGH (ref 135–145)

## 2019-02-08 LAB — PHOSPHORUS: Phosphorus: 3.7 mg/dL (ref 2.5–4.6)

## 2019-02-08 MED ORDER — THIAMINE HCL 100 MG/ML IJ SOLN
100.0000 mg | Freq: Every day | INTRAMUSCULAR | Status: DC
  Administered 2019-02-08 – 2019-02-13 (×6): 100 mg via INTRAVENOUS
  Filled 2019-02-08 (×6): qty 2

## 2019-02-08 MED ORDER — FOLIC ACID 5 MG/ML IJ SOLN
1.0000 mg | Freq: Every day | INTRAMUSCULAR | Status: DC
  Administered 2019-02-09 – 2019-02-13 (×5): 1 mg via INTRAVENOUS
  Filled 2019-02-08 (×8): qty 0.2

## 2019-02-08 MED ORDER — LEVETIRACETAM IN NACL 500 MG/100ML IV SOLN
750.0000 mg | Freq: Two times a day (BID) | INTRAVENOUS | Status: DC
  Filled 2019-02-08: qty 200

## 2019-02-08 MED ORDER — SODIUM CHLORIDE 0.9 % IV SOLN
750.0000 mg | Freq: Two times a day (BID) | INTRAVENOUS | Status: DC
  Filled 2019-02-08 (×2): qty 7.5

## 2019-02-08 MED ORDER — SODIUM CHLORIDE 0.9 % IV SOLN
750.0000 mg | Freq: Two times a day (BID) | INTRAVENOUS | Status: DC
  Filled 2019-02-08: qty 7.5

## 2019-02-08 MED ORDER — SODIUM CHLORIDE 0.9 % IV SOLN
750.0000 mg | Freq: Two times a day (BID) | INTRAVENOUS | Status: DC
  Administered 2019-02-08 – 2019-02-13 (×10): 750 mg via INTRAVENOUS
  Filled 2019-02-08 (×11): qty 7.5

## 2019-02-08 NOTE — Progress Notes (Signed)
  S:  Remains somewhat lethargic and difficult to engage in conversation.  O:  Afebrile with sats in the mid 90s on supplemental oxygen       Lungs are decreased bilaterally.      Heart regular  A:  Small air leak still present on expiration from spontaneous pneumothorax  P:  Will repeat his chest xray today.  Depending on results of chest xray, may place tube to water seal.  [employer].

## 2019-02-08 NOTE — Progress Notes (Signed)
[place] for Electrolyte Monitoring and Replacement   Recent Labs: Potassium (mmol/L)  Date Value  02/08/2019 4.6  02/09/2014 4.0   Magnesium (mg/dL)  Date Value  02/08/2019 2.5 (H)  08/09/2012 1.5 (L)   Calcium (mg/dL)  Date Value  02/08/2019 8.3 (L)   Calcium, Total (mg/dL)  Date Value  02/09/2014 8.5   Albumin (g/dL)  Date Value  02/08/2019 2.2 (L)  02/09/2014 3.6   Phosphorus (mg/dL)  Date Value  02/08/2019 3.7   Sodium (mmol/L)  Date Value  02/08/2019 148 (H)  02/09/2014 141     Assessment: 78 year old male admitted with COVID. Patient's sodium elevated. Pharmacy to monitor electrolytes.  Goal of Therapy:  Electrolytes WNL  Plan:  D5 increased to 149mL/hr.   Electrolytes with am labs.   Pharmacy will continue to monitor and adjust per consult.   Bethzaida Boord L  02/08/2019 5:48 PM

## 2019-02-09 LAB — CBC
HCT: 35.3 % — ABNORMAL LOW (ref 39.0–52.0)
Hemoglobin: 12.3 g/dL — ABNORMAL LOW (ref 13.0–17.0)
MCH: 30.8 pg (ref 26.0–34.0)
MCHC: 34.8 g/dL (ref 30.0–36.0)
MCV: 88.5 fL (ref 80.0–100.0)
Platelets: 212 10*3/uL (ref 150–400)
RBC: 3.99 MIL/uL — ABNORMAL LOW (ref 4.22–5.81)
RDW: 14.7 % (ref 11.5–15.5)
WBC: 14.1 10*3/uL — ABNORMAL HIGH (ref 4.0–10.5)
nRBC: 0 % (ref 0.0–0.2)

## 2019-02-09 LAB — BASIC METABOLIC PANEL
Anion gap: 7 (ref 5–15)
BUN: 57 mg/dL — ABNORMAL HIGH (ref 8–23)
CO2: 18 mmol/L — ABNORMAL LOW (ref 22–32)
Calcium: 8.2 mg/dL — ABNORMAL LOW (ref 8.9–10.3)
Chloride: 122 mmol/L — ABNORMAL HIGH (ref 98–111)
Creatinine, Ser: 1.84 mg/dL — ABNORMAL HIGH (ref 0.61–1.24)
GFR calc Af Amer: 39 mL/min — ABNORMAL LOW (ref >60)
GFR calc non Af Amer: 34 mL/min — ABNORMAL LOW (ref >60)
Glucose, Bld: 157 mg/dL — ABNORMAL HIGH (ref 70–99)
Potassium: 4.7 mmol/L (ref 3.5–5.1)
Sodium: 147 mmol/L — ABNORMAL HIGH (ref 135–145)

## 2019-02-09 LAB — CULTURE, BLOOD (ROUTINE X 2)
Culture: NO GROWTH
Culture: NO GROWTH
Special Requests: ADEQUATE

## 2019-02-09 LAB — PROCALCITONIN: Procalcitonin: <0.1 ng/mL

## 2019-02-09 MED ORDER — MORPHINE SULFATE (PF) 2 MG/ML IV SOLN
2.0000 mg | INTRAVENOUS | Status: DC | PRN
  Administered 2019-02-09 – 2019-02-13 (×11): 2 mg via INTRAVENOUS
  Filled 2019-02-09 (×11): qty 1

## 2019-02-09 MED ORDER — MIDAZOLAM HCL 2 MG/2ML IJ SOLN
2.0000 mg | Freq: Once | INTRAMUSCULAR | Status: AC
  Administered 2019-02-09: 2 mg via INTRAVENOUS

## 2019-02-09 MED ORDER — MIDAZOLAM HCL 2 MG/2ML IJ SOLN
INTRAMUSCULAR | Status: AC
  Filled 2019-02-09: qty 2

## 2019-02-09 NOTE — Progress Notes (Signed)
Initial Nutrition Assessment  DOCUMENTATION CODES:   Not applicable  INTERVENTION:  Patient is now day 5 of NPO status and is unsafe for PO intake. Per discussion in rounds he will not be able to have NGT placed as he would pull it out.   If diet still unable to be advanced recommend initiation of TPN by 12/18. Once central line placed and confirmed recommend initiating Clinimix E 5/15 at 40 mL/hr x 24 hrs + 20% ILE (lipids) 250 mL over 12 hrs.  After 24 hours if electrolytes, CBGs, and triglycerides are within acceptable range advance to goal regimen of Clinimix E 5/15 at 83 mL/hr x 24 hrs + 20% ILE (lipids) 250 mL over 12 hrs. Provides 1914 kcal, 100 grams of protein, 1992 mL from Clinimix daily.  Provide trace elements daily in TPN and adult MVI per protocol in setting of shortage. Also recommend adding thiamine 222 mg and folic acid 1 mg daily to TPN bag.  Monitor magnesium, potassium, and phosphorus daily for at least 3 days, MD to replete as needed, as pt is at risk for refeeding syndrome.  If mentation changes and he would be able to have NGT placement for tube feeds that would of course be preferred over initiating TPN.  NUTRITION DIAGNOSIS:   Inadequate oral intake related to inability to eat as evidenced by NPO status.  GOAL:   Patient will meet greater than or equal to 90% of their needs  MONITOR:   Diet advancement, Labs, Weight trends, I & O's  REASON FOR ASSESSMENT:   Consult Assessment of nutrition requirement/status  ASSESSMENT:   78 year old male with PMHx of lymphoma in remission, asthma, HTN, COPD, seizures, gout, BPH admitted with severe COPD exacerbation, COVID-19 PNA, severe EtOH withdrawal, pneumothorax s/p placement of 2 chest tubes.   Patient is COVID+ on airborne/contact precautions so RD unable to enter room. Patient has been encephalopathic requiring Precedex gtt. He has now been NPO for 5 days. Per discussion in rounds he would not be able to have  NGT placed as he would pull out the tube. Since patient is unable to provide any history unable to determine his nutrition intake PTA.  According to chart patient has been 66-70 kg the past year. Suspect current weight entered of 86.2 kg (190 lbs) is incorrect. Patient does not appear to weigh 190 lbs through the window.  IV Access: two PIVs  Medications reviewed and include: folic acid 1 mg daily IV, Flomax, thiamine 100 mg daily IV, vitamin C 500 mg daily (unable to take), zinc sulfate 220 mg daily (unable to take), MVI daily (unable to take), Precedex gtt, D5W at 100 mL/hr, Keppra.  Labs reviewed: Sodium 147, CO2 18, BUN 57, Creatinine 1.84.  Patient is at risk for malnutrition. Unable to tell if he meets criteria without full nutrition/weight history or NFPE.  NUTRITION - FOCUSED PHYSICAL EXAM:  Unable to complete at this time.  Diet Order:   Diet Order            Diet NPO time specified  Diet effective now             EDUCATION NEEDS:   No education needs have been identified at this time  Skin:  Skin Assessment: Reviewed RN Assessment  Last BM:  Unknown  Height:   Ht Readings from Last 1 Encounters:  02/10/2019 5\' 10"  (1.778 m)   Weight:   Wt Readings from Last 1 Encounters:  02/06/2019 86.2 kg  Ideal Body Weight:  75.5 kg  BMI:  Body mass index is 27.26 kg/m.  Estimated Nutritional Needs:   Kcal:  2000-2200  Protein:  100-110 grams  Fluid:  2-2.2 L/day  Jacklynn Barnacle, MS, RD, LDN Office: [phone] Pager: [phone] After Hours/Weekend Pager: [phone]

## 2019-02-09 NOTE — Progress Notes (Signed)
Video call with daughter complete.

## 2019-02-09 NOTE — Progress Notes (Addendum)
[place] for Electrolyte Monitoring and Replacement   Recent Labs: Potassium (mmol/L)  Date Value  02/09/2019 4.7  02/09/2014 4.0   Magnesium (mg/dL)  Date Value  02/08/2019 2.5 (H)  08/09/2012 1.5 (L)   Calcium (mg/dL)  Date Value  02/09/2019 8.2 (L)   Calcium, Total (mg/dL)  Date Value  02/09/2014 8.5   Albumin (g/dL)  Date Value  02/08/2019 2.2 (L)  02/09/2014 3.6   Phosphorus (mg/dL)  Date Value  02/08/2019 3.7   Sodium (mmol/L)  Date Value  02/09/2019 147 (H)  02/09/2014 141     Assessment: 78 year old male admitted with COVID. Patient's sodium elevated. Pharmacy to monitor electrolytes.  Goal of Therapy:  Electrolytes WNL  Plan:  Continue D5 at 160mL/hr.   Electrolytes with am labs.   Pharmacy will continue to monitor and adjust per consult.   Elya Diloreto L  02/09/2019 2:29 PM

## 2019-02-10 DIAGNOSIS — J9311 Primary spontaneous pneumothorax: Secondary | ICD-10-CM

## 2019-02-10 DIAGNOSIS — F10231 Alcohol dependence with withdrawal delirium: Secondary | ICD-10-CM

## 2019-02-10 DIAGNOSIS — J441 Chronic obstructive pulmonary disease with (acute) exacerbation: Secondary | ICD-10-CM

## 2019-02-10 LAB — CBC WITH DIFFERENTIAL/PLATELET
Abs Immature Granulocytes: 0.21 10*3/uL — ABNORMAL HIGH (ref 0.00–0.07)
Basophils Absolute: 0 10*3/uL (ref 0.0–0.1)
Basophils Relative: 0 %
Eosinophils Absolute: 0.1 10*3/uL (ref 0.0–0.5)
Eosinophils Relative: 1 %
HCT: 36 % — ABNORMAL LOW (ref 39.0–52.0)
Hemoglobin: 11.9 g/dL — ABNORMAL LOW (ref 13.0–17.0)
Immature Granulocytes: 2 %
Lymphocytes Relative: 6 %
Lymphs Abs: 0.8 10*3/uL (ref 0.7–4.0)
MCH: 30.3 pg (ref 26.0–34.0)
MCHC: 33.1 g/dL (ref 30.0–36.0)
MCV: 91.6 fL (ref 80.0–100.0)
Monocytes Absolute: 1.3 10*3/uL — ABNORMAL HIGH (ref 0.1–1.0)
Monocytes Relative: 10 %
Neutro Abs: 10.1 10*3/uL — ABNORMAL HIGH (ref 1.7–7.7)
Neutrophils Relative %: 81 %
Platelets: 190 10*3/uL (ref 150–400)
RBC: 3.93 MIL/uL — ABNORMAL LOW (ref 4.22–5.81)
RDW: 14.8 % (ref 11.5–15.5)
Smear Review: NORMAL
WBC: 12.5 10*3/uL — ABNORMAL HIGH (ref 4.0–10.5)
nRBC: 0 % (ref 0.0–0.2)

## 2019-02-10 LAB — BASIC METABOLIC PANEL
Anion gap: 8 (ref 5–15)
BUN: 43 mg/dL — ABNORMAL HIGH (ref 8–23)
CO2: 16 mmol/L — ABNORMAL LOW (ref 22–32)
Calcium: 8 mg/dL — ABNORMAL LOW (ref 8.9–10.3)
Chloride: 118 mmol/L — ABNORMAL HIGH (ref 98–111)
Creatinine, Ser: 1.57 mg/dL — ABNORMAL HIGH (ref 0.61–1.24)
GFR calc Af Amer: 48 mL/min — ABNORMAL LOW (ref >60)
GFR calc non Af Amer: 41 mL/min — ABNORMAL LOW (ref >60)
Glucose, Bld: 141 mg/dL — ABNORMAL HIGH (ref 70–99)
Potassium: 5.2 mmol/L — ABNORMAL HIGH (ref 3.5–5.1)
Sodium: 142 mmol/L (ref 135–145)

## 2019-02-10 LAB — POTASSIUM: Potassium: 4.1 mmol/L (ref 3.5–5.1)

## 2019-02-10 MED ORDER — LORAZEPAM 2 MG/ML IJ SOLN
INTRAMUSCULAR | Status: AC
  Filled 2019-02-10: qty 1

## 2019-02-10 MED ORDER — LORAZEPAM 2 MG/ML IJ SOLN
2.0000 mg | Freq: Once | INTRAMUSCULAR | Status: AC
  Administered 2019-02-10: 2 mg via INTRAVENOUS

## 2019-02-10 MED ORDER — LORAZEPAM 2 MG/ML IJ SOLN
2.0000 mg | Freq: Once | INTRAMUSCULAR | Status: AC
  Administered 2019-02-12: 2 mg via INTRAVENOUS
  Filled 2019-02-10: qty 1

## 2019-02-10 MED ORDER — SODIUM CHLORIDE 0.9 % IV SOLN
0.0000 ug/min | INTRAVENOUS | Status: DC
  Administered 2019-02-11: 5 ug/min via INTRAVENOUS
  Administered 2019-02-11: 10 ug/min via INTRAVENOUS
  Administered 2019-02-11: 30 ug/min via INTRAVENOUS
  Administered 2019-02-12 (×3): 20 ug/min via INTRAVENOUS
  Administered 2019-02-13: 34 ug/min via INTRAVENOUS
  Administered 2019-02-13: 20 ug/min via INTRAVENOUS
  Filled 2019-02-10 (×3): qty 10
  Filled 2019-02-10: qty 1
  Filled 2019-02-10 (×2): qty 10
  Filled 2019-02-10: qty 1
  Filled 2019-02-10: qty 10

## 2019-02-10 MED ORDER — HALOPERIDOL LACTATE 5 MG/ML IJ SOLN
5.0000 mg | Freq: Once | INTRAMUSCULAR | Status: AC
  Administered 2019-02-10: 5 mg via INTRAMUSCULAR
  Filled 2019-02-10: qty 1

## 2019-02-10 MED ORDER — SODIUM CHLORIDE 0.9 % IV BOLUS
250.0000 mL | Freq: Once | INTRAVENOUS | Status: AC
  Administered 2019-02-10: 250 mL via INTRAVENOUS

## 2019-02-10 NOTE — Progress Notes (Signed)
Clinical status relayed to family  Updated and notified of patients medical condition-  Progressive multiorgan failure with very low chance of meaningful recovery.  Patient is in slow dying  Process.  Family understands the situation.  They have consented and agreed to DNR/DNI  Will continue medical and surgical care as tolerated.  Family are satisfied with Plan of action and management. All questions answered  Corrin Parker, M.D.  [facility]  Medical Director [place] Director [facility] Cardio-Pulmonary Department

## 2019-02-10 NOTE — Progress Notes (Signed)
[place] for Electrolyte Monitoring and Replacement   Recent Labs: Potassium (mmol/L)  Date Value  02/10/2019 5.2 (H)  02/09/2014 4.0   Magnesium (mg/dL)  Date Value  02/08/2019 2.5 (H)  08/09/2012 1.5 (L)   Calcium (mg/dL)  Date Value  02/10/2019 8.0 (L)   Calcium, Total (mg/dL)  Date Value  02/09/2014 8.5   Albumin (g/dL)  Date Value  02/08/2019 2.2 (L)  02/09/2014 3.6   Phosphorus (mg/dL)  Date Value  02/08/2019 3.7   Sodium (mmol/L)  Date Value  02/10/2019 142  02/09/2014 141     Assessment: 78 year old male admitted with COVID.  Patient has medical history significant for BPH, lymphoma, COPD, hypertension, and seizure disorder He was found to have R 4.5 cm pneumothorax, s/p 2 chest tubes with subsequent CXR confirming re-expansion.  This admission, he completed antibiotic therapy for pneumonia with azithromycin and cefepime.  Patient's sodium has been consistently elevated, and currently on D5 drip. Pharmacy to monitor electrolytes.   Goal of Therapy:  Electrolytes WNL  Plan:  Sodium is back WNL (142), but patient is currently NPO for several days.  Per rounds, he will likely pull out NGT if placed.   Continue D5 at 147mL/hr.  Electrolytes with am labs.   Pharmacy will continue to monitor and adjust per consult.   Gerald Dexter  02/10/2019 11:05 AM

## 2019-02-10 NOTE — Consult Note (Signed)
CRITICAL CARE PROGRESS NOTE    Name: Robert Weeks MRN: [id number] DOB: [birth year]     LOS: 6   SUBJECTIVE FINDINGS & SIGNIFICANT EVENTS   Patient description:  78 yo hx lymphoma, copd, htn, seizures, BIBEMS with AMS confused, family is +covid patient had contact with them and was diagnosed COVID+ on 01/23/19.  In ED he was hypoxemic but improved nasal canula at 3L/min.  Patient found to have R 4.5 cm pneumothorax, s/p chest tube 5F placement initially at Right with no re-expansion noted on post procedure CXR.  Following this a CT surgery consultation was placed and patient had second chest tube placed 14 F inferoposterior to first one and subsequent CXR confirmed re-expansion. Patient had chest tube placed on -20cm water suction. He was admitted to hospitalist service with critical care consultation placed for further management of above.    Lines / Drains: PIVx3  Cultures / Sepsis markers: -COVID+  Antibiotics: -Vanco/zosyn - stopped now on zithromax/rocephin -Remdesevir  12/13- remains on low dose precedex with good response for aggitation and withdrawal. 12/14 Chest tube in place PTX,  12/15-12/16 remains delirious on precedex, chest tube in palce Small air leak still present on expiration from spontaneous pneumothorax   CC  Follow up resp failure  HPI Remains encephalopathic On precedex Low BP Given IVF's Prognosis is very poor Small air leak still present on expiration from spontaneous pneumothorax  COVID-19 DISASTER DECLARATION:   FULL CONTACT PHYSICAL EXAMINATION WAS NOT POSSIBLE DUE TO TREATMENT OF COVID-19 AND   CONSERVATION OF PERSONAL PROTECTIVE EQUIPMENT, LIMITED EXAM FINDINGS INCLUDE-   Patient assessed or the symptoms described in the history of present illness.   In the  context of the Global COVID-19 pandemic, which necessitated consideration that the patient might be at risk for infection with the SARS-CoV-2 virus that causes COVID-19, Institutional protocols and algorithms that pertain to the evaluation of patients at risk for COVID-19 are in a state of rapid change based on information released by regulatory bodies including the CDC and federal and state organizations. These policies and algorithms were followed during the patient's care while in hospital.        PAST MEDICAL HISTORY   Past Medical History:  Diagnosis Date  . Asthma   . BPH (benign prostatic hyperplasia)   . Cancer [facility])    lymphom dx in 2011 in remission  . COPD (chronic obstructive pulmonary disease) ([place])   . Elevated PSA   . Erectile dysfunction   . Gout   . Hypertension   . Lymphoma ([place])   . Seizures ([place])      SURGICAL HISTORY   Past Surgical History:  Procedure Laterality Date  . COLONOSCOPY WITH PROPOFOL N/A 08/25/2014   Procedure: COLONOSCOPY WITH PROPOFOL;  Surgeon: Hulen Luster, MD;  Location: [facility] ENDOSCOPY;  Service: Gastroenterology;  Laterality: N/A;  . COLONOSCOPY WITH PROPOFOL N/A 10/04/2018   Procedure: COLONOSCOPY WITH PROPOFOL;  Surgeon: Toledo, Benay Pike, MD;  Location: [facility] ENDOSCOPY;  Service: Gastroenterology;  Laterality: N/A;  . CYSTOSCOPY    . lymphectomy    . THORACENTESIS       FAMILY HISTORY   Family History  Problem Relation Age of Onset  . Kidney disease Father 87  . Prostate cancer Neg Hx   . Bladder Cancer Neg Hx      SOCIAL HISTORY   Social History   Tobacco Use  . Smoking status: Former Smoker    Types: Cigarettes    Quit date: 05/01/2009  Years since quitting: 9.7  . Smokeless tobacco: Current User    Types: Snuff  Substance Use Topics  . Alcohol use: Yes    Alcohol/week: 8.0 standard drinks    Types: 1 Cans of beer, 3 Shots of liquor, 4 Standard drinks or equivalent per week    Comment: 2-3 times weekly  . Drug use:  No     MEDICATIONS   Current Medication:  Current Facility-Administered Medications:  .  0.9 %  sodium chloride infusion, 250 mL, Intravenous, Continuous, Stretch, Marily Lente, MD, Last Rate: 5 mL/hr at 02/10/19 0820, Rate Verify at 02/10/19 0820 .  Chlorhexidine Gluconate Cloth 2 % PADS 6 each, 6 each, Topical, Daily, Nolberto Hanlon, MD, 6 each at 02/07/19 [phone] .  dexmedetomidine (PRECEDEX) 400 MCG/100ML (4 mcg/mL) infusion, 0.4-1.2 mcg/kg/hr, Intravenous, Titrated, Stretch, Marily Lente, MD, Last Rate: 10.78 mL/hr at 02/10/19 0820, 0.5 mcg/kg/hr at 02/10/19 0820 .  dextrose 5 % solution, , Intravenous, Continuous, Markies Mowatt, MD, Last Rate: 100 mL/hr at 02/10/19 0820, Rate Verify at 02/10/19 0820 .  folic acid injection 1 mg, 1 mg, Intravenous, Daily, Amina Menchaca, MD, 1 mg at 02/09/19 0944 .  heparin injection 5,000 Units, 5,000 Units, Subcutaneous, Q8H, Thornell Mule, MD, 5,000 Units at 02/10/19 0511 .  lamoTRIgine (LAMICTAL) tablet 150 mg, 150 mg, Oral, BID, Kurtis Bushman, Sahar, MD .  levETIRAcetam (KEPPRA) 750 mg in sodium chloride 0.9 % 100 mL IVPB, 750 mg, Intravenous, Q12H, Flora Lipps, MD, Stopped at 02/09/19 2304 .  LORazepam (ATIVAN) 2 MG/ML injection, , , ,  .  morphine 2 MG/ML injection 2 mg, 2 mg, Intravenous, Q4H PRN, Flora Lipps, MD, 2 mg at 02/10/19 0019 .  multivitamin with minerals tablet 1 tablet, 1 tablet, Oral, Daily, Amery, Sahar, MD .  sodium chloride flush (NS) 0.9 % injection 3 mL, 3 mL, Intravenous, Q12H, Amery, Sahar, MD, 3 mL at 02/09/19 2251 .  tamsulosin (FLOMAX) capsule 0.4 mg, 0.4 mg, Oral, Daily, Amery, Sahar, MD .  thiamine (B-1) injection 100 mg, 100 mg, Intravenous, Daily, Leiann Sporer, MD, 100 mg at 02/09/19 0944 .  vitamin C (ASCORBIC ACID) tablet 500 mg, 500 mg, Oral, Daily, Kurtis Bushman, Sahar, MD .  zinc sulfate capsule 220 mg, 220 mg, Oral, Daily, Kurtis Bushman, Sahar, MD    ALLERGIES   Finasteride  REVIEW OF SYSTEMS  PATIENT IS UNABLE TO PROVIDE COMPLETE REVIEW OF  SYSTEM S DUE TO SEVERE CRITICAL ILLNESS AND ENCEPHALOPATHY   PHYSICAL EXAMINATION   Vital Signs: Temp:  [97.4 F (36.3 C)-97.8 F (36.6 C)] 97.6 F (36.4 C) (12/17 0800) Pulse Rate:  [44-67] 62 (12/17 0800) Resp:  [11-29] 20 (12/17 0800) BP: (75-111)/(51-83) 106/74 (12/17 0800) SpO2:  [88 %-99 %] 88 % (12/17 0800)     PERTINENT DATA     Infusions: . sodium chloride 5 mL/hr at 02/10/19 0820  . dexmedetomidine (PRECEDEX) IV infusion 0.5 mcg/kg/hr (02/10/19 0820)  . dextrose 100 mL/hr at 02/10/19 0820  . levETIRAcetam Stopped (02/09/19 2304)   Scheduled Medications: . Chlorhexidine Gluconate Cloth  6 each Topical Daily  . folic acid  1 mg Intravenous Daily  . heparin injection (subcutaneous)  5,000 Units Subcutaneous Q8H  . lamoTRIgine  150 mg Oral BID  . LORazepam      . multivitamin with minerals  1 tablet Oral Daily  . sodium chloride flush  3 mL Intravenous Q12H  . tamsulosin  0.4 mg Oral Daily  . thiamine injection  100 mg Intravenous Daily  . vitamin C  500 mg Oral Daily  . zinc sulfate  220 mg Oral Daily   PRN Medications: morphine injection Hemodynamic parameters:   Intake/Output: 12/16 0701 - 12/17 0700 In: 3673.4 [I.V.:3467.7; IV Piggyback:205.8] Out: 2295 [Urine:2225; Chest Tube:70]  Ventilator  Settings:     LAB RESULTS:  Basic Metabolic Panel: Recent Labs  Lab 02/07/19 0201 02/08/19 0200 02/08/19 0958 02/08/19 1509 02/09/19 0404 02/10/19 0546  NA 150* 150* 148* 148* 147* 142  K 5.6*  --  5.0 4.6 4.7 5.2*  CL 124*  --  123* 124* 122* 118*  CO2 18*  --  18* 18* 18* 16*  GLUCOSE 129*  --  127* 141* 157* 141*  BUN 84*  --  72* 66* 57* 43*  CREATININE 2.83*  --  2.42* 2.13* 1.84* 1.57*  CALCIUM 8.6*  --  8.3* 8.3* 8.2* 8.0*  MG  --   --  2.5*  --   --   --   PHOS  --   --  3.7  --   --   --    Liver Function Tests: Recent Labs  Lab 02/21/2019 1330 02/06/19 1422 02/08/19 0958  AST 16 19 18   ALT 12 13 13   ALKPHOS 97 67 70  BILITOT  1.2 0.4 1.0  PROT 8.0 6.0* 5.6*  ALBUMIN 3.4* 2.3* 2.2*   No results for input(s): LIPASE, AMYLASE in the last 168 hours. Recent Labs  Lab 02/08/19 1059  AMMONIA <9*   CBC: Recent Labs  Lab 02/20/2019 1330 02/10/2019 2042 02/05/19 0600 02/08/19 0958 02/09/19 0404 02/10/19 0546  WBC 19.4* 17.3* 15.7* 14.8* 14.1* 12.5*  NEUTROABS 16.6*  --   --  12.4*  --  10.1*  HGB 14.8 13.3 13.6 12.2* 12.3* 11.9*  HCT 42.3 39.4 39.5 35.3* 35.3* 36.0*  MCV 88.7 92.1 89.8 88.9 88.5 91.6  PLT 405* 352 331 212 212 190   Cardiac Enzymes: No results for input(s): CKTOTAL, CKMB, CKMBINDEX, TROPONINI in the last 168 hours. BNP: Invalid input(s): POCBNP CBG: Recent Labs  Lab 02/03/2019 2019 02/06/19 0146  GLUCAP 113* 137*     IMAGING RESULTS:  Imaging: DG Chest Port 1 View  Result Date: 02/08/2019 CLINICAL DATA:  Pneumothorax EXAM: PORTABLE CHEST 1 VIEW COMPARISON:  Portable exam at 1123 hours compared to 02/07/2019 FINDINGS: Pigtail RIGHT thoracostomy tube again identified with pigtail at midline. Enlargement of cardiac silhouette. Atherosclerotic calcification aorta. Mediastinal contours and pulmonary vascularity normal. Bibasilar atelectasis. Small loculated RIGHT basilar and minimal RIGHT apical pneumothorax. Remaining lungs clear. No significant pleural effusion. Bones demineralized. IMPRESSION: Persistent RIGHT apical and basilar pneumothorax despite thoracostomy tube. Bibasilar atelectasis. Electronically Signed   By: Lavonia Dana M.D.   On: 02/08/2019 11:30     ASSESSMENT AND PLAN   SEVERE COPD EXACERBATION -continue IV steroids as prescribed -continue NEB THERAPY as prescribed -morphine as needed -wean fio2 as needed and tolerated   COVID 19 infection PNEUMONIA Oxygen as needed IV abx prescribed  SEVERE ALCOHOL WITHDRAWAL -Therapy with Thiamine and MVI -CIWA Protocol -Precedex as needed -High risk for intubation  -high risk for aspiration   ELECTROLYTES -follow labs as  needed -replace as needed -pharmacy consultation and following    DVT/GI PRX ordered TRANSFUSIONS AS NEEDED MONITOR FSBS ASSESS the need for LABS as needed      Critical Care Time devoted to patient care services described in this note is 32 minutes.   Overall, patient is critically ill, prognosis is guarded.  Patient with Multiorgan failure and at  high risk for cardiac arrest and death.    Corrin Parker, M.D.  [facility]  Medical Director [place] Director [facility] Cardio-Pulmonary Department

## 2019-02-10 NOTE — Progress Notes (Signed)
Dr. Mortimer Fries aware of pt's BP at this time and orders received to keep MAP of 60.

## 2019-02-10 NOTE — Progress Notes (Signed)
Discussed care with Dr. Mortimer Fries.  The patient still has a small but persistent air leak.    His last chest xray showed the tube to be in good position with a small pneumothorax  In most circumstances, a chest CT would be helpful to assess the etiology of his air leak.  However, it is felt that he is too high risk for any surgical procedure and even transportation is an issue  Therefore, we will leave his chest tube in for now and make no changes to this aspect of his management.  [employer].

## 2019-02-10 NOTE — Progress Notes (Signed)
[facility] for Electrolyte Monitoring and Replacement   Recent Labs: Potassium (mmol/L)  Date Value  02/10/2019 4.1  02/09/2014 4.0   Magnesium (mg/dL)  Date Value  02/08/2019 2.5 (H)  08/09/2012 1.5 (L)   Calcium (mg/dL)  Date Value  02/10/2019 8.0 (L)   Calcium, Total (mg/dL)  Date Value  02/09/2014 8.5   Albumin (g/dL)  Date Value  02/08/2019 2.2 (L)  02/09/2014 3.6   Phosphorus (mg/dL)  Date Value  02/08/2019 3.7   Sodium (mmol/L)  Date Value  02/10/2019 142  02/09/2014 141     Assessment: 78 year old male admitted with COVID.  Patient has medical history significant for BPH, lymphoma, COPD, hypertension, and seizure disorder He was found to have R 4.5 cm pneumothorax, s/p 2 chest tubes with subsequent CXR confirming re-expansion.  This admission, he completed antibiotic therapy for pneumonia with azithromycin and cefepime.  Patient's sodium has been consistently elevated, and currently on D5 drip. Pharmacy to monitor electrolytes.   Goal of Therapy:  Electrolytes WNL  Plan:  Sodium is back WNL (142), but patient is currently NPO for several days.  Per rounds, he will likely pull out NGT if placed.   Continue D5 at 168mL/hr.  Electrolytes with am labs.    12/17:  K @ 2048 = 4.1  Will recheck electrolytes on 12/18 with AM labs.   Pharmacy will continue to monitor and adjust per consult.   Madai Nuccio D  02/10/2019 9:34 PM

## 2019-02-11 ENCOUNTER — Inpatient Hospital Stay: Payer: Medicare Other

## 2019-02-11 ENCOUNTER — Encounter: Payer: Self-pay | Admitting: Internal Medicine

## 2019-02-11 DIAGNOSIS — Z7189 Other specified counseling: Secondary | ICD-10-CM

## 2019-02-11 DIAGNOSIS — Z515 Encounter for palliative care: Secondary | ICD-10-CM

## 2019-02-11 MED ORDER — DEXTROSE IN LACTATED RINGERS 5 % IV SOLN: INTRAVENOUS | Status: DC

## 2019-02-11 NOTE — Consult Note (Signed)
Consultation Note Date: 02/11/2019   Patient Name: Robert Weeks  DOB: [birth year]  MRN: [id number]  Age / Sex: 78 y.o., male  PCP: Baxter Hire, MD Referring Physician: Flora Lipps, MD  Reason for Consultation: Establishing goals of care  HPI/Patient Profile: 78 y.o. male  with past medical history of BPH, lymphoma, COPD, hypertension, and seizure disorder  admitted on 02/24/2019 with severe COPD exacerebation, COVID PNE, alcohol withdrawal.   Clinical Assessment and Goals of Care: [name] is lying quietly in bed.   He is covid + and is viewed through the door.  He appears acutely/chronically ill and frail.  Chart reviewed and report received from [facility] attending and bedside nursing staff.  There is no family at bedside at this time.   Call to daughter/HCPOA, Burt Ek at [phone].  No answer, left VM message.   PMT to continue to follow.    HCPOA     NEXT OF KIN - Burt Ek, daughter. Sister Gary Fleet    SUMMARY OF RECOMMENDATIONS   Treat the treatable, but no CPR or intubation.    Code Status/Advance Care Planning:  DNR  Symptom Management:   Per CCM, no additional needs at this time   Palliative Prophylaxis:   Oral Care and Turn Reposition  Additional Recommendations (Limitations, Scope, Preferences):  treat the treatable, no CPR or intubation  Psycho-social/Spiritual:   Desire for further Chaplaincy support:no  Additional Recommendations: Caregiving  Support/Resources and Education on Hospice  Prognosis:   Unable to determine, guarded  Discharge Planning: to be determined, based on outcomes.       Primary Diagnoses: Present on Admission: . Pneumothorax   I have reviewed the medical record, interviewed the patient and family, and examined the patient. The following aspects are pertinent.  Past Medical History:  Diagnosis Date  . Asthma   .  BPH (benign prostatic hyperplasia)   . Cancer [facility])    lymphom dx in 2011 in remission  . COPD (chronic obstructive pulmonary disease) ([place])   . Elevated PSA   . Erectile dysfunction   . Gout   . Hypertension   . Lymphoma ([place])   . Seizures ([place])    Social History   Socioeconomic History  . Marital status: Widowed    Spouse name: Not on file  . Number of children: Not on file  . Years of education: Not on file  . Highest education level: Not on file  Occupational History  . Not on file  Tobacco Use  . Smoking status: Former Smoker    Types: Cigarettes    Quit date: 05/01/2009    Years since quitting: 9.7  . Smokeless tobacco: Current User    Types: Snuff  Substance and Sexual Activity  . Alcohol use: Yes    Alcohol/week: 8.0 standard drinks    Types: 1 Cans of beer, 3 Shots of liquor, 4 Standard drinks or equivalent per week    Comment: 2-3 times weekly  . Drug use: No  . Sexual activity: Not  on file  Other Topics Concern  . Not on file  Social History Narrative  . Not on file   Social Determinants of Health   Financial Resource Strain:   . Difficulty of Paying Living Expenses: Not on file  Food Insecurity:   . Worried About Charity fundraiser in the Last Year: Not on file  . Ran Out of Food in the Last Year: Not on file  Transportation Needs:   . Lack of Transportation (Medical): Not on file  . Lack of Transportation (Non-Medical): Not on file  Physical Activity:   . Days of Exercise per Week: Not on file  . Minutes of Exercise per Session: Not on file  Stress:   . Feeling of Stress : Not on file  Social Connections:   . Frequency of Communication with Friends and Family: Not on file  . Frequency of Social Gatherings with Friends and Family: Not on file  . Attends Religious Services: Not on file  . Active Member of Clubs or Organizations: Not on file  . Attends Archivist Meetings: Not on file  . Marital Status: Not on file   Family History    Problem Relation Age of Onset  . Kidney disease Father 65  . Prostate cancer Neg Hx   . Bladder Cancer Neg Hx    Scheduled Meds: . Chlorhexidine Gluconate Cloth  6 each Topical Daily  . folic acid  1 mg Intravenous Daily  . heparin injection (subcutaneous)  5,000 Units Subcutaneous Q8H  . lamoTRIgine  150 mg Oral BID  . LORazepam  2 mg Intravenous Once  . multivitamin with minerals  1 tablet Oral Daily  . sodium chloride flush  3 mL Intravenous Q12H  . tamsulosin  0.4 mg Oral Daily  . thiamine injection  100 mg Intravenous Daily  . vitamin C  500 mg Oral Daily  . zinc sulfate  220 mg Oral Daily   Continuous Infusions: . sodium chloride 5 mL/hr at 02/11/19 0813  . dexmedetomidine (PRECEDEX) IV infusion 1.2 mcg/kg/hr (02/11/19 0926)  . dextrose 100 mL/hr at 02/11/19 0813  . levETIRAcetam 750 mg (02/11/19 0940)  . phenylephrine (NEO-SYNEPHRINE) Adult infusion 30 mcg/min (02/11/19 0813)   PRN Meds:.morphine injection Medications Prior to Admission:  Prior to Admission medications   Medication Sig Start Date End Date Taking? Authorizing Provider  guaiFENesin (ROBITUSSIN) 100 MG/5ML SOLN Take 5 mLs (100 mg total) by mouth every 4 (four) hours as needed for cough or to loosen phlegm. 01/23/19  Yes Carrie Mew, MD  lamoTRIgine (LAMICTAL) 150 MG tablet Take 150 mg by mouth 2 (two) times daily.  03/29/15  Yes [provider]  levETIRAcetam (KEPPRA) 750 MG tablet Take 750 mg by mouth 2 (two) times daily.  06/28/14  Yes [provider]  lisinopril (PRINIVIL,ZESTRIL) 5 MG tablet Take 5 mg by mouth daily.  07/10/14  Yes [provider]  potassium chloride SA (K-DUR,KLOR-CON) 20 MEQ tablet Take 20 mEq by mouth daily.   Yes [provider]  tamsulosin (FLOMAX) 0.4 MG CAPS capsule Take 0.4 mg by mouth daily. 01/06/19  Yes [provider]   Allergies  Allergen Reactions  . Finasteride    Review of Systems  Unable to perform ROS: Acuity of  condition    Physical Exam Vitals and nursing note reviewed.  Constitutional:      General: He is not in acute distress.    Appearance: He is ill-appearing.  Cardiovascular:     Comments:  Rate 50's  Pulmonary:     Effort: Pulmonary effort is normal. No accessory muscle usage.  Skin:    General: Skin is warm and dry.  Psychiatric:        Mood and Affect: Mood is not anxious.        Behavior: Behavior is not agitated.     Vital Signs: BP 115/70   Pulse (!) 53   Temp 97.6 F (36.4 C) (Axillary)   Resp 20   Ht 5\' 10"  (1.778 m)   Wt 86.2 kg   SpO2 100%   BMI 27.26 kg/m  Pain Scale: CPOT POSS *See Group Information*: S-Acceptable,Sleep, easy to arouse Pain Score: Asleep   SpO2: SpO2: 100 % O2 Device:SpO2: 100 % O2 Flow Rate: .O2 Flow Rate (L/min): 6 L/min  IO: Intake/output summary:   Intake/Output Summary (Last 24 hours) at 02/11/2019 1113 Last data filed at 02/11/2019 0813 Gross per 24 hour  Intake 3445.96 ml  Output 1600 ml  Net 1845.96 ml    LBM: Last BM Date: (PTA ) Baseline Weight: Weight: 86.2 kg Most recent weight: Weight: 86.2 kg     Palliative Assessment/Data:   Flowsheet Rows     Most Recent Value  Intake Tab  Referral Department  Critical care  Unit at Time of Referral  ICU  Palliative Care Primary Diagnosis  Pulmonary  Date Notified  02/11/19  Palliative Care Type  New Palliative care  Reason for referral  Clarify Goals of Care  Date of Admission  02/19/2019  Date first seen by Palliative Care  02/11/19  # of days Palliative referral response time  0 Day(s)  # of days IP prior to Palliative referral  7  Clinical Assessment  Palliative Performance Scale Score  30%  Pain Max last 24 hours  Not able to report  Pain Min Last 24 hours  Not able to report  Dyspnea Max Last 24 Hours  Not able to report  Dyspnea Min Last 24 hours  Not able to report  Psychosocial & Spiritual Assessment  Palliative Care Outcomes      Time In: 1105 Time Out:  1140 Time Total: 35 minutes  Greater than 50%  of this time was spent counseling and coordinating care related to the above assessment and plan.  Signed by: Drue Novel, NP   Please contact Palliative Medicine Team phone at [phone] for questions and concerns.  For individual provider: See Shea Evans

## 2019-02-11 NOTE — Progress Notes (Signed)
CRITICAL CARE PROGRESS NOTE    Name: Robert Weeks MRN: [id number] DOB: [birth year]     LOS: 7   SUBJECTIVE FINDINGS & SIGNIFICANT EVENTS   Patient description:  78 yo hx lymphoma, copd, htn, seizures, resented via EMS with AMS, was diagnosed COVID+ on 01/23/19.  In ED he was hypoxemic but improved nasal canula at 3L/min.  Patient found to have R 4.5 cm pneumothorax, s/p chest tube 70F placement initially at Right with no re-expansion noted on post procedure CXR.  Following this a CT surgery consultation was placed and patient had second chest tube placed 14 F inferoposterior to first one and subsequent CXR confirmed re-expansion. Patient had chest tube placed on -20cm water suction. He was admitted to hospitalist service with critical care consultation placed for further management of above.  Has continued to be encephalopathic, no reexpansion of right lung.   Lines / Drains: PIVx3  Cultures / Sepsis markers: -COVID+  Antibiotics: -Vanco/zosyn - stopped now on zithromax/rocephin -Remdesevir, completed  12/13- remains on low dose precedex with good response for aggitation and withdrawal. 12/14 Chest tube in place PTX,  12/15-12/16 remains delirious on precedex, chest tube in palce Small air leak still present on expiration from spontaneous pneumothorax 12/17-persistently encephalopathic.  Chest tube with persistent leak no complete reexpansion of his right lung.   CC  Follow up resp failure  HPI Remains encephalopathic On precedex Low BP Given IVF's Prognosis is very poor Small air leak still present on expiration from spontaneous pneumothorax  COVID-19 DISASTER DECLARATION:   FULL CONTACT PHYSICAL EXAMINATION WAS NOT POSSIBLE DUE TO TREATMENT OF COVID-19 AND   CONSERVATION OF PERSONAL PROTECTIVE  EQUIPMENT, LIMITED EXAM FINDINGS INCLUDE-   Patient assessed or the symptoms described in the history of present illness.   In the context of the Global COVID-19 pandemic, which necessitated consideration that the patient might be at risk for infection with the SARS-CoV-2 virus that causes COVID-19, Institutional protocols and algorithms that pertain to the evaluation of patients at risk for COVID-19 are in a state of rapid change based on information released by regulatory bodies including the CDC and federal and state organizations. These policies and algorithms were followed during the patient's care while in    MEDICATIONS   Current Medication:  Current Facility-Administered Medications:  .  0.9 %  sodium chloride infusion, 250 mL, Intravenous, Continuous, Stretch, Marily Lente, MD, Stopped at 02/11/19 1000 .  Chlorhexidine Gluconate Cloth 2 % PADS 6 each, 6 each, Topical, Daily, Nolberto Hanlon, MD, 6 each at 02/07/19 [phone] .  dexmedetomidine (PRECEDEX) 400 MCG/100ML (4 mcg/mL) infusion, 0.4-1.2 mcg/kg/hr, Intravenous, Titrated, Stretch, Marily Lente, MD, Last Rate: 25.9 mL/hr at 02/11/19 2136, 1.2 mcg/kg/hr at 02/11/19 2136 .  dextrose 5 % in lactated ringers infusion, , Intravenous, Continuous, Tyler Pita, MD, Last Rate: 75 mL/hr at 02/11/19 2000, Rate Verify at 02/11/19 2000 .  folic acid injection 1 mg, 1 mg, Intravenous, Daily, Kasa, Kurian, MD, 1 mg at 02/11/19 0931 .  heparin injection 5,000 Units, 5,000 Units, Subcutaneous, Q8H, Thornell Mule, MD, 5,000 Units at 02/11/19 2145 .  lamoTRIgine (LAMICTAL) tablet 150 mg, 150 mg, Oral, BID, Kurtis Bushman, Sahar, MD .  levETIRAcetam (KEPPRA) 750 mg in sodium chloride 0.9 % 100 mL IVPB, 750 mg, Intravenous, Q12H, Kasa, Kurian, MD, Last Rate: 430 mL/hr at 02/11/19 2137, 750 mg at 02/11/19 2137 .  LORazepam (ATIVAN) injection 2 mg, 2 mg, Intravenous, Once, Darel Hong D, NP .  morphine 2 MG/ML injection 2  mg, 2 mg, Intravenous, Q4H PRN, Flora Lipps, MD, 2 mg at 02/11/19 1659 .  multivitamin with minerals tablet 1 tablet, 1 tablet, Oral, Daily, Amery, Sahar, MD .  phenylephrine (NEO-SYNEPHRINE) 10 mg in sodium chloride 0.9 % 250 mL (0.04 mg/mL) infusion, 0-400 mcg/min, Intravenous, Titrated, Darel Hong D, NP, Last Rate: 7.5 mL/hr at 02/11/19 2000, 5 mcg/min at 02/11/19 2000 .  sodium chloride flush (NS) 0.9 % injection 3 mL, 3 mL, Intravenous, Q12H, Nolberto Hanlon, MD, 3 mL at 02/11/19 2134 .  tamsulosin (FLOMAX) capsule 0.4 mg, 0.4 mg, Oral, Daily, Amery, Sahar, MD .  thiamine (B-1) injection 100 mg, 100 mg, Intravenous, Daily, Kasa, Kurian, MD, 100 mg at 02/11/19 0931 .  vitamin C (ASCORBIC ACID) tablet 500 mg, 500 mg, Oral, Daily, Kurtis Bushman, Sahar, MD .  zinc sulfate capsule 220 mg, 220 mg, Oral, Daily, Kurtis Bushman, Sahar, MD   REVIEW OF SYSTEMS  PATIENT IS UNABLE TO PROVIDE COMPLETE REVIEW OF SYSTEM S DUE TO SEVERE CRITICAL ILLNESS AND ENCEPHALOPATHY   PHYSICAL EXAMINATION   Vital Signs: Temp:  [96 F (35.6 C)-97.9 F (36.6 C)] 97.5 F (36.4 C) (12/18 2145) Pulse Rate:  [43-110] 46 (12/18 2300) Resp:  [11-32] 13 (12/18 2300) BP: (66-154)/(49-100) 112/62 (12/18 2300) SpO2:  [84 %-100 %] 91 % (12/18 2300)  As noted above examination was limited due to full PPE requirement.  Patient still encephalopathic, moves all 4 spontaneously.  Right chest tube still in place 14 [place], with intermittent air leak.  Chest x-ray shows persistent pneumothorax on the right despite chest tube placement.  PERTINENT DATA   Infusions: . sodium chloride Stopped (02/11/19 1000)  . dexmedetomidine (PRECEDEX) IV infusion 1.2 mcg/kg/hr (02/11/19 2136)  . dextrose 5% lactated ringers 75 mL/hr at 02/11/19 2000  . levETIRAcetam 750 mg (02/11/19 2137)  . phenylephrine (NEO-SYNEPHRINE) Adult infusion 5 mcg/min (02/11/19 2000)   Scheduled Medications: . Chlorhexidine Gluconate Cloth  6 each Topical Daily  . folic acid  1 mg Intravenous Daily  .  heparin injection (subcutaneous)  5,000 Units Subcutaneous Q8H  . lamoTRIgine  150 mg Oral BID  . LORazepam  2 mg Intravenous Once  . multivitamin with minerals  1 tablet Oral Daily  . sodium chloride flush  3 mL Intravenous Q12H  . tamsulosin  0.4 mg Oral Daily  . thiamine injection  100 mg Intravenous Daily  . vitamin C  500 mg Oral Daily  . zinc sulfate  220 mg Oral Daily   PRN Medications: morphine injection Hemodynamic parameters:   Intake/Output: 12/17 0701 - 12/18 0700 In: 3339.7 [I.V.:3115.5; IV Piggyback:224.2] Out: 1600 [JIRCV:8938]  Ventilator  Settings:     LAB RESULTS:  Basic Metabolic Panel: Recent Labs  Lab 02/07/19 0201 02/08/19 0200 02/08/19 0958 02/08/19 1509 02/09/19 0404 02/10/19 0546 02/10/19 2048  NA 150* 150* 148* 148* 147* 142  --   K 5.6*  --  5.0 4.6 4.7 5.2* 4.1  CL 124*  --  123* 124* 122* 118*  --   CO2 18*  --  18* 18* 18* 16*  --   GLUCOSE 129*  --  127* 141* 157* 141*  --   BUN 84*  --  72* 66* 57* 43*  --   CREATININE 2.83*  --  2.42* 2.13* 1.84* 1.57*  --   CALCIUM 8.6*  --  8.3* 8.3* 8.2* 8.0*  --   MG  --   --  2.5*  --   --   --   --  PHOS  --   --  3.7  --   --   --   --    Liver Function Tests: Recent Labs  Lab 02/06/19 1422 02/08/19 0958  AST 19 18  ALT 13 13  ALKPHOS 67 70  BILITOT 0.4 1.0  PROT 6.0* 5.6*  ALBUMIN 2.3* 2.2*   No results for input(s): LIPASE, AMYLASE in the last 168 hours. Recent Labs  Lab 02/08/19 1059  AMMONIA <9*   CBC: Recent Labs  Lab 02/05/19 0600 02/08/19 0958 02/09/19 0404 02/10/19 0546  WBC 15.7* 14.8* 14.1* 12.5*  NEUTROABS  --  12.4*  --  10.1*  HGB 13.6 12.2* 12.3* 11.9*  HCT 39.5 35.3* 35.3* 36.0*  MCV 89.8 88.9 88.5 91.6  PLT 331 212 212 190   Cardiac Enzymes: No results for input(s): CKTOTAL, CKMB, CKMBINDEX, TROPONINI in the last 168 hours. BNP: Invalid input(s): POCBNP CBG: Recent Labs  Lab 02/06/19 0146  GLUCAP 137*     IMAGING RESULTS:  Imaging: DG  Chest Port 1 View  Result Date: 02/11/2019 CLINICAL DATA:  Pneumothorax. EXAM: PORTABLE CHEST 1 VIEW COMPARISON:  One-view chest x-ray 02/08/2019 FINDINGS: The heart size is normal. Right-sided pneumothorax is increasing. There is likely some pleural fluid on the right as well. Left basilar airspace disease has progressed some. Right-sided chest tube remains in place, fairly medial. IMPRESSION: 1. Increasing right-sided pneumothorax despite stable appearance of the right-sided chest tube. 2. Progressive left lower lobe airspace disease concerning for infection. 3. These results will be called to the ordering clinician or representative by the Radiologist Assistant, and communication documented in the PACS or zVision Dashboard. Electronically Signed   By: San Morelle M.D.   On: 02/11/2019 05:49     ASSESSMENT AND PLAN   Acute Hypoxic Respiratory Failure COVID-19 late pulmonary phase Continues with pneumothorax on the RIGHT status post chest tubes x2 Completed remdesivir and Decadron protocol  On 3L/min [place]  Thoracic surgery following- appreciate input  Remains encephalopathic   Alcohol abuse Toxic metabolic encephalopathy Continue low-dose Precedex Continue thiamine and folate repletion Encephalopathy may be in part due to withdrawal   COPD without exacerbation Bronchodilators Has received corticosteroids, completed  Acute on Chronic Renal Failure-most likely due to ATN Electrolyte imbalance secondary to the same -follow chem 7 -follow UO -continue Foley Catheter-assess need daily   ID Completed antibiotics Completed COVID-19 protocol Continue COVID-19 precautions  GI/Nutrition Unable to take p.o.'s reliably Hopefully can start feeding p.o. May need TPN versus postpyloric feeding tube placement  ENDO - ICU hypoglycemic\Hyperglycemia protocol -check FSBS per protocol   ELECTROLYTES -follow labs as needed -replace as needed -pharmacy  consultation   DVT/GI PRX ordered -SCDs  TRANSFUSIONS AS NEEDED MONITOR FSBS ASSESS the need for LABS as needed  Discussed during multidisciplinary rounds.  Patient's prognosis overall is exceedingly poor.    Renold Don, MD [facility]    *This note was dictated using voice recognition software/Dragon.  Despite best efforts to proofread, errors can occur which can change the meaning.  Any change was purely unintentional.

## 2019-02-11 NOTE — Progress Notes (Addendum)
[facility] for Electrolyte Monitoring and Replacement   Recent Labs: Potassium (mmol/L)  Date Value  02/10/2019 4.1  02/09/2014 4.0   Magnesium (mg/dL)  Date Value  02/08/2019 2.5 (H)  08/09/2012 1.5 (L)   Calcium (mg/dL)  Date Value  02/10/2019 8.0 (L)   Calcium, Total (mg/dL)  Date Value  02/09/2014 8.5   Albumin (g/dL)  Date Value  02/08/2019 2.2 (L)  02/09/2014 3.6   Phosphorus (mg/dL)  Date Value  02/08/2019 3.7   Sodium (mmol/L)  Date Value  02/10/2019 142  02/09/2014 141     Assessment: 78 year old male admitted with COVID.  Patient has medical history significant for BPH, lymphoma, COPD, hypertension, and seizure disorder He was found to have R 4.5 cm pneumothorax, s/p 2 chest tubes with subsequent CXR confirming re-expansion.  This admission, he completed antibiotic therapy for pneumonia with azithromycin and cefepime.  Patient's sodium has been consistently elevated, and currently on D5 drip. Pharmacy to monitor electrolytes.   Goal of Therapy:  Electrolytes WNL  Plan:  Patient refused labs this AM.  Last sodium 12/17 was 142.   He is currently NPO for several days.  Per MD, will discontinue D5 and start D5LR at 75 ml/h.   Electrolytes with am labs.     Pharmacy will continue to monitor and adjust per consult.   Gerald Dexter  02/11/2019 1:55 PM

## 2019-02-12 DIAGNOSIS — U071 COVID-19: Principal | ICD-10-CM

## 2019-02-12 DIAGNOSIS — J9312 Secondary spontaneous pneumothorax: Secondary | ICD-10-CM

## 2019-02-12 DIAGNOSIS — G9349 Other encephalopathy: Secondary | ICD-10-CM

## 2019-02-12 DIAGNOSIS — I878 Other specified disorders of veins: Secondary | ICD-10-CM

## 2019-02-12 LAB — CBC WITH DIFFERENTIAL/PLATELET
Abs Immature Granulocytes: 0.33 10*3/uL — ABNORMAL HIGH (ref 0.00–0.07)
Basophils Absolute: 0 10*3/uL (ref 0.0–0.1)
Basophils Relative: 0 %
Eosinophils Absolute: 0.1 10*3/uL (ref 0.0–0.5)
Eosinophils Relative: 1 %
HCT: 37 % — ABNORMAL LOW (ref 39.0–52.0)
Hemoglobin: 12.1 g/dL — ABNORMAL LOW (ref 13.0–17.0)
Immature Granulocytes: 2 %
Lymphocytes Relative: 3 %
Lymphs Abs: 0.5 10*3/uL — ABNORMAL LOW (ref 0.7–4.0)
MCH: 30.2 pg (ref 26.0–34.0)
MCHC: 32.7 g/dL (ref 30.0–36.0)
MCV: 92.3 fL (ref 80.0–100.0)
Monocytes Absolute: 1.4 10*3/uL — ABNORMAL HIGH (ref 0.1–1.0)
Monocytes Relative: 9 %
Neutro Abs: 13.9 10*3/uL — ABNORMAL HIGH (ref 1.7–7.7)
Neutrophils Relative %: 85 %
Platelets: 184 10*3/uL (ref 150–400)
RBC: 4.01 MIL/uL — ABNORMAL LOW (ref 4.22–5.81)
RDW: 14.6 % (ref 11.5–15.5)
WBC: 16.3 10*3/uL — ABNORMAL HIGH (ref 4.0–10.5)
nRBC: 0 % (ref 0.0–0.2)

## 2019-02-12 LAB — BASIC METABOLIC PANEL
Anion gap: 4 — ABNORMAL LOW (ref 5–15)
BUN: 27 mg/dL — ABNORMAL HIGH (ref 8–23)
CO2: 21 mmol/L — ABNORMAL LOW (ref 22–32)
Calcium: 7.8 mg/dL — ABNORMAL LOW (ref 8.9–10.3)
Chloride: 116 mmol/L — ABNORMAL HIGH (ref 98–111)
Creatinine, Ser: 1.25 mg/dL — ABNORMAL HIGH (ref 0.61–1.24)
GFR calc Af Amer: >60 mL/min (ref >60)
GFR calc non Af Amer: 54 mL/min — ABNORMAL LOW (ref >60)
Glucose, Bld: 118 mg/dL — ABNORMAL HIGH (ref 70–99)
Potassium: 4 mmol/L (ref 3.5–5.1)
Sodium: 141 mmol/L (ref 135–145)

## 2019-02-12 LAB — PHOSPHORUS: Phosphorus: 2.6 mg/dL (ref 2.5–4.6)

## 2019-02-12 LAB — MAGNESIUM: Magnesium: 1.6 mg/dL — ABNORMAL LOW (ref 1.7–2.4)

## 2019-02-12 MED ORDER — LORAZEPAM 2 MG/ML IJ SOLN
INTRAMUSCULAR | Status: AC
  Filled 2019-02-12: qty 1

## 2019-02-12 MED ORDER — LORAZEPAM 2 MG/ML IJ SOLN
0.5000 mg | INTRAMUSCULAR | Status: DC | PRN
  Administered 2019-02-12 – 2019-02-14 (×6): 1 mg via INTRAVENOUS
  Filled 2019-02-12 (×5): qty 1

## 2019-02-12 MED ORDER — MAGNESIUM SULFATE 2 GM/50ML IV SOLN
2.0000 g | Freq: Once | INTRAVENOUS | Status: AC
  Administered 2019-02-12: 2 g via INTRAVENOUS
  Filled 2019-02-12: qty 50

## 2019-02-12 NOTE — Progress Notes (Addendum)
CRITICAL CARE PROGRESS NOTE    Name: Kyngston Pickelsimer MRN: [id number] DOB: 1941-02-18     LOS: 8   SUBJECTIVE FINDINGS & SIGNIFICANT EVENTS   Patient description:  78 yo hx lymphoma, copd, htn, seizures, resented via EMS with AMS, was diagnosed COVID+ on 01/23/19.  In ED he was hypoxemic but improved nasal canula at 3L/min.  Patient found to have R 4.5 cm pneumothorax, s/p chest tube 38F placement initially at Right with no re-expansion noted on post procedure CXR.  Following this a CT surgery consultation was placed and patient had second chest tube placed 14 F inferoposterior to first one and subsequent CXR confirmed re-expansion. Patient had chest tube placed on -20cm water suction. He was admitted to hospitalist service with critical care consultation placed for further management of above.  Has continued to be encephalopathic, no reexpansion of right lung.   Lines / Drains: PIVx3  Cultures / Sepsis markers: -COVID+  Antibiotics: -Vanco/zosyn - stopped now on zithromax/rocephin -Remdesevir  Events: 12/13- remains on low dose precedex with good response for aggitation and withdrawal. 12/14 Chest tube in place PTX,  12/15-12/16 remains delirious on precedex, chest tube in palce Small air leak still present on expiration from spontaneous pneumothorax 12/17-persistently encephalopathic.  Chest tube with persistent leak no complete reexpansion of his right lung.  CC  Follow up resp failure  HPI Remains encephalopathic On precedex Prognosis is very poor Persistent intermittent air leak right lung  COVID-19 DISASTER DECLARATION:   FULL CONTACT PHYSICAL EXAMINATION WAS NOT POSSIBLE DUE TO TREATMENT OF COVID-19 AND   CONSERVATION OF PERSONAL PROTECTIVE EQUIPMENT, LIMITED EXAM FINDINGS INCLUDE-   Patient  assessed or the symptoms described in the history of present illness.   In the context of the Global COVID-19 pandemic, which necessitated consideration that the patient might be at risk for infection with the SARS-CoV-2 virus that causes COVID-19, Institutional protocols and algorithms that pertain to the evaluation of patients at risk for COVID-19 are in a state of rapid change based on information released by regulatory bodies including the CDC and federal and state organizations. These policies and algorithms were followed during the patient's care while in hospital.  Current Medications:  Current Facility-Administered Medications:  .  0.9 %  sodium chloride infusion, 250 mL, Intravenous, Continuous, Stretch, Marily Lente, MD, Stopped at 02/11/19 1000 .  Chlorhexidine Gluconate Cloth 2 % PADS 6 each, 6 each, Topical, Daily, Nolberto Hanlon, MD, 6 each at 02/12/19 1059 .  dexmedetomidine (PRECEDEX) 400 MCG/100ML (4 mcg/mL) infusion, 0.4-1.2 mcg/kg/hr, Intravenous, Titrated, Stretch, Marily Lente, MD, Stopped at 02/12/19 1306 .  dextrose 5 % in lactated ringers infusion, , Intravenous, Continuous, Tyler Pita, MD, Last Rate: 20 mL/hr at 02/12/19 1557, New Bag at 02/12/19 1557 .  folic acid injection 1 mg, 1 mg, Intravenous, Daily, Kasa, Kurian, MD, 1 mg at 02/12/19 1055 .  heparin injection 5,000 Units, 5,000 Units, Subcutaneous, Q8H, Thornell Mule, MD, 5,000 Units at 02/12/19 1436 .  lamoTRIgine (LAMICTAL) tablet 150 mg, 150 mg, Oral, BID, Kurtis Bushman, Sahar, MD .  levETIRAcetam (KEPPRA) 750 mg in sodium chloride 0.9 % 100 mL IVPB, 750 mg, Intravenous, Q12H, Flora Lipps, MD, Stopped at 02/12/19 1925 .  LORazepam (ATIVAN) injection 0.5-1 mg, 0.5-1 mg, Intravenous, Q4H PRN, Awilda Bill, NP, 1 mg at 02/12/19 1553 .  LORazepam (ATIVAN) injection 2 mg, 2 mg, Intravenous, Once, Darel Hong D, NP .  morphine 2 MG/ML injection 2 mg, 2 mg, Intravenous, Q4H PRN, Kasa, Kurian,  MD, 2 mg at 02/12/19 0230 .   multivitamin with minerals tablet 1 tablet, 1 tablet, Oral, Daily, Amery, Sahar, MD .  phenylephrine (NEO-SYNEPHRINE) 10 mg in sodium chloride 0.9 % 250 mL (0.04 mg/mL) infusion, 0-400 mcg/min, Intravenous, Titrated, Darel Hong D, NP, Last Rate: 30 mL/hr at 02/12/19 2015, 20 mcg/min at 02/12/19 2015 .  sodium chloride flush (NS) 0.9 % injection 3 mL, 3 mL, Intravenous, Q12H, Amery, Sahar, MD, 3 mL at 02/12/19 1059 .  tamsulosin (FLOMAX) capsule 0.4 mg, 0.4 mg, Oral, Daily, Amery, Sahar, MD .  thiamine (B-1) injection 100 mg, 100 mg, Intravenous, Daily, Kasa, Kurian, MD, 100 mg at 02/12/19 1055 .  vitamin C (ASCORBIC ACID) tablet 500 mg, 500 mg, Oral, Daily, Amery, Sahar, MD .  zinc sulfate capsule 220 mg, 220 mg, Oral, Daily, Kurtis Bushman, Sahar, MD  REVIEW OF SYSTEMS  PATIENT IS UNABLE TO PROVIDE COMPLETE REVIEW OF SYSTEM S DUE TO SEVERE CRITICAL ILLNESS AND ENCEPHALOPATHY   PHYSICAL EXAMINATION   Vital Signs: Temp:  [96.9 F (36.1 C)-97.9 F (36.6 C)] 97.9 F (36.6 C) (12/19 2000) Pulse Rate:  [43-69] 67 (12/19 2000) Resp:  [10-27] 20 (12/19 2000) BP: (64-140)/(45-111) 106/59 (12/19 2000) SpO2:  [84 %-99 %] 94 % (12/19 2000)  As noted above exam is limited due to full PPE/CAPR With persistent air leak on chest tube.  Chest x-ray persistent pneumothorax on the right, slightly larger  PERTINENT DATA   Infusions: . sodium chloride Stopped (02/11/19 1000)  . dexmedetomidine (PRECEDEX) IV infusion Stopped (02/12/19 1306)  . dextrose 5% lactated ringers 20 mL/hr at 02/12/19 1557  . levETIRAcetam Stopped (02/12/19 1925)  . phenylephrine (NEO-SYNEPHRINE) Adult infusion 20 mcg/min (02/12/19 2015)   Scheduled Medications: . Chlorhexidine Gluconate Cloth  6 each Topical Daily  . folic acid  1 mg Intravenous Daily  . heparin injection (subcutaneous)  5,000 Units Subcutaneous Q8H  . lamoTRIgine  150 mg Oral BID  . LORazepam  2 mg Intravenous Once  . multivitamin with minerals  1  tablet Oral Daily  . sodium chloride flush  3 mL Intravenous Q12H  . tamsulosin  0.4 mg Oral Daily  . thiamine injection  100 mg Intravenous Daily  . vitamin C  500 mg Oral Daily  . zinc sulfate  220 mg Oral Daily   PRN Medications: LORazepam, morphine injection Hemodynamic parameters:   Intake/Output: 12/18 0701 - 12/19 0700 In: 3143.5 [I.V.:2927.5; IV Piggyback:216] Out: 1860 [Urine:1725; Chest Tube:135]  Ventilator  Settings:     LAB RESULTS:  Basic Metabolic Panel: Recent Labs  Lab 02/08/19 0958 02/08/19 1509 02/09/19 0404 02/10/19 0546 02/10/19 2048 02/12/19 0346  NA 148* 148* 147* 142  --  141  K 5.0 4.6 4.7 5.2* 4.1 4.0  CL 123* 124* 122* 118*  --  116*  CO2 18* 18* 18* 16*  --  21*  GLUCOSE 127* 141* 157* 141*  --  118*  BUN 72* 66* 57* 43*  --  27*  CREATININE 2.42* 2.13* 1.84* 1.57*  --  1.25*  CALCIUM 8.3* 8.3* 8.2* 8.0*  --  7.8*  MG 2.5*  --   --   --   --  1.6*  PHOS 3.7  --   --   --   --  2.6   Liver Function Tests: Recent Labs  Lab 02/06/19 1422 02/08/19 0958  AST 19 18  ALT 13 13  ALKPHOS 67 70  BILITOT 0.4 1.0  PROT 6.0* 5.6*  ALBUMIN 2.3* 2.2*  No results for input(s): LIPASE, AMYLASE in the last 168 hours. Recent Labs  Lab 02/08/19 1059  AMMONIA <9*   CBC: Recent Labs  Lab 02/08/19 0958 02/09/19 0404 02/10/19 0546 02/12/19 0346  WBC 14.8* 14.1* 12.5* 16.3*  NEUTROABS 12.4*  --  10.1* 13.9*  HGB 12.2* 12.3* 11.9* 12.1*  HCT 35.3* 35.3* 36.0* 37.0*  MCV 88.9 88.5 91.6 92.3  PLT 212 212 190 184   Cardiac Enzymes: No results for input(s): CKTOTAL, CKMB, CKMBINDEX, TROPONINI in the last 168 hours. BNP: Invalid input(s): POCBNP CBG: Recent Labs  Lab 02/06/19 0146  GLUCAP 137*     IMAGING RESULTS:  Imaging: DG Chest Port 1 View  Result Date: 02/11/2019 CLINICAL DATA:  Pneumothorax. EXAM: PORTABLE CHEST 1 VIEW COMPARISON:  One-view chest x-ray 02/08/2019 FINDINGS: The heart size is normal. Right-sided  pneumothorax is increasing. There is likely some pleural fluid on the right as well. Left basilar airspace disease has progressed some. Right-sided chest tube remains in place, fairly medial. IMPRESSION: 1. Increasing right-sided pneumothorax despite stable appearance of the right-sided chest tube. 2. Progressive left lower lobe airspace disease concerning for infection. 3. These results will be called to the ordering clinician or representative by the Radiologist Assistant, and communication documented in the PACS or zVision Dashboard. Electronically Signed   By: San Morelle M.D.   On: 02/11/2019 05:49     ASSESSMENT AND PLAN   Acute Hypoxic Respiratory Failure COVID-19 late pulmonary phase Continues with pneumothorax on the RIGHT status post chest tubes x2 Completed remdesivir and Decadron protocol  On 3L/min [place]  Thoracic surgery following- appreciate input  Remains encephalopathic   Alcohol abuse Toxic metabolic encephalopathy [id number] encephalopathy Continue low-dose Precedex Continue thiamine and folate repletion Encephalopathy may be in part due to withdrawal   COPD without exacerbation Bronchodilators Has received corticosteroids, completed  Acute on ChronicRenal Failure-most likely due to ATN Electrolyte imbalance secondary to the same -follow chem 7 -follow UO -continue Foley Catheter-assess need daily   ID Completed antibiotics Completed COVID-19 protocol Continue COVID-19 precautions  GI/Nutrition Unable to take p.o.'s reliably Hopefully can start feeding p.o. May need TPN versus postpyloric feeding tube placement  ENDO - ICU hypoglycemic\Hyperglycemia protocol -check FSBS per protocol    DVT/GI PRX ordered -SCDs  TRANSFUSIONS AS NEEDED MONITOR FSBS ASSESS the need for LABS as needed  Discussed during multidisciplinary rounds.  Patient's prognosis overall is exceedingly poor.    Renold Don, MD [place]  [facility]    *This note was dictated using voice recognition software/Dragon.  Despite best efforts to proofread, errors can occur which can change the meaning.  Any change was purely unintentional.

## 2019-02-12 NOTE — Progress Notes (Signed)
Assisted tele visit to patient with daughter. ° °Adna Nofziger P, RN  °

## 2019-02-12 NOTE — Procedures (Signed)
Central Venous Catheter Insertion Procedure Note Robert Weeks [id number] 1941-02-17  Procedure: Insertion of Central Venous Catheter Indications: Assessment of intravascular volume, Drug and/or fluid administration and Frequent blood sampling  Procedure Details Consent: Unable to obtain consent because of emergent medical necessity. Time Out: Verified patient identification, verified procedure, site/side was marked, verified correct patient position, special equipment/implants available, medications/allergies/relevent history reviewed, required imaging and test results available.  Performed  Maximum sterile technique was used including antiseptics, cap, gloves, gown, hand hygiene, mask and sheet. Skin prep: Chlorhexidine; local anesthetic administered A antimicrobial bonded/coated triple lumen catheter was placed in the right femoral vein due to emergent situation using the Seldinger technique.  Evaluation Blood flow good Complications: No apparent complications Patient did tolerate procedure well. Chest X-ray ordered to verify placement.  CXR: not indicated.  IV team unable to place peripheral iv and pt no longer had working iv access. Attempted to contact pts daughter Robert Weeks via telephone to obtain consent, however she did not answer the phone.  Proceeded with emerrgent central line placement.  Attempted to place right internal jugular CVL, however pt agitated and not tolerating sterile drape covering his face despite precedex gtt.  Therefore, inserted right femoral central line utilizing ultrasound no complications noted during or following procedure.   Marda Stalker, [place] Pager [phone] (please enter 7 digits) PCCM Consult Pager [phone] (please enter 7 digits)

## 2019-02-12 NOTE — Progress Notes (Signed)
[facility] for Electrolyte Monitoring and Replacement   Recent Labs: Potassium (mmol/L)  Date Value  02/12/2019 4.0  02/09/2014 4.0   Magnesium (mg/dL)  Date Value  02/12/2019 1.6 (L)  08/09/2012 1.5 (L)   Calcium (mg/dL)  Date Value  02/12/2019 7.8 (L)   Calcium, Total (mg/dL)  Date Value  02/09/2014 8.5   Albumin (g/dL)  Date Value  02/08/2019 2.2 (L)  02/09/2014 3.6   Phosphorus (mg/dL)  Date Value  02/12/2019 2.6   Sodium (mmol/L)  Date Value  02/12/2019 141  02/09/2014 141     Assessment: 78 year old male admitted with COVID.  Patient has medical history significant for BPH, lymphoma, COPD, hypertension, and seizure disorder He was found to have R 4.5 cm pneumothorax, s/p 2 chest tubes with subsequent CXR confirming re-expansion.  This admission, he completed antibiotic therapy for pneumonia with azithromycin and cefepime.  Patient's sodium has been consistently elevated, and currently on D5 drip. Pharmacy to monitor electrolytes.   Goal of Therapy:  Electrolytes WNL  Plan:  D5LR at 58mL/hr.   Magnesium 2g IV x 2.   Electrolytes with am labs.   Pharmacy will continue to monitor and adjust per consult.   Rayme Bui L  02/12/2019 2:56 PM

## 2019-02-13 ENCOUNTER — Inpatient Hospital Stay: Payer: Medicare Other

## 2019-02-13 LAB — CBC
HCT: 38.2 % — ABNORMAL LOW (ref 39.0–52.0)
Hemoglobin: 12.6 g/dL — ABNORMAL LOW (ref 13.0–17.0)
MCH: 30.2 pg (ref 26.0–34.0)
MCHC: 33 g/dL (ref 30.0–36.0)
MCV: 91.6 fL (ref 80.0–100.0)
Platelets: 194 10*3/uL (ref 150–400)
RBC: 4.17 MIL/uL — ABNORMAL LOW (ref 4.22–5.81)
RDW: 14.6 % (ref 11.5–15.5)
WBC: 21.6 10*3/uL — ABNORMAL HIGH (ref 4.0–10.5)
nRBC: 0 % (ref 0.0–0.2)

## 2019-02-13 LAB — RENAL FUNCTION PANEL
Albumin: 1.7 g/dL — ABNORMAL LOW (ref 3.5–5.0)
Anion gap: 4 — ABNORMAL LOW (ref 5–15)
BUN: 20 mg/dL (ref 8–23)
CO2: 21 mmol/L — ABNORMAL LOW (ref 22–32)
Calcium: 7.5 mg/dL — ABNORMAL LOW (ref 8.9–10.3)
Chloride: 118 mmol/L — ABNORMAL HIGH (ref 98–111)
Creatinine, Ser: 1.29 mg/dL — ABNORMAL HIGH (ref 0.61–1.24)
GFR calc Af Amer: >60 mL/min (ref >60)
GFR calc non Af Amer: 52 mL/min — ABNORMAL LOW (ref >60)
Glucose, Bld: 116 mg/dL — ABNORMAL HIGH (ref 70–99)
Phosphorus: 2.3 mg/dL — ABNORMAL LOW (ref 2.5–4.6)
Potassium: 3.5 mmol/L (ref 3.5–5.1)
Sodium: 143 mmol/L (ref 135–145)

## 2019-02-13 LAB — MAGNESIUM: Magnesium: 2.1 mg/dL (ref 1.7–2.4)

## 2019-02-13 MED ORDER — MORPHINE SULFATE (PF) 2 MG/ML IV SOLN
2.0000 mg | INTRAVENOUS | Status: DC | PRN
  Administered 2019-02-14: 2 mg via INTRAVENOUS
  Filled 2019-02-13: qty 1

## 2019-02-13 MED ORDER — SODIUM PHOSPHATES 45 MMOLE/15ML IV SOLN
10.0000 mmol | Freq: Once | INTRAVENOUS | Status: AC
  Administered 2019-02-13: 10 mmol via INTRAVENOUS
  Filled 2019-02-13: qty 3.33

## 2019-02-13 MED ORDER — KCL-LACTATED RINGERS-D5W 20 MEQ/L IV SOLN
INTRAVENOUS | Status: DC
  Administered 2019-02-13: 75 mL/h via INTRAVENOUS
  Filled 2019-02-13 (×2): qty 1000

## 2019-02-13 NOTE — Progress Notes (Signed)
Pharmacy Electrolyte Monitoring Consult:  Pharmacy consulted to assist in monitoring and replacing electrolytes in this 78 y.o. male admitted on 01/29/2019 with severe COPD exacerbation.   Labs:  Sodium (mmol/L)  Date Value  02/13/2019 143  02/09/2014 141   Potassium (mmol/L)  Date Value  02/13/2019 3.5  02/09/2014 4.0   Magnesium (mg/dL)  Date Value  02/13/2019 2.1  08/09/2012 1.5 (L)   Phosphorus (mg/dL)  Date Value  02/13/2019 2.3 (L)   Calcium (mg/dL)  Date Value  02/13/2019 7.5 (L)   Calcium, Total (mg/dL)  Date Value  02/09/2014 8.5   Albumin (g/dL)  Date Value  02/13/2019 1.7 (L)  02/09/2014 3.6    Assessment/Plan: Plan:  Potassium trending down. Will transition MIVF to D5LR/83mEq Potassium at 96mL/hr.   Patient receiving sodium phosphate 67mmol IV x1.    BMP/Magnesium/Phosphorus with am labs.   Will replace for goal potassium ~ 4, goal magnesium ~2, and goal phosphorus ~ 2.5-3.   Pharmacy will continue to monitor and adjust per consult.   Nashika Coker L 02/13/2019 8:37 AM

## 2019-02-13 NOTE — Progress Notes (Signed)
CRITICAL CARE PROGRESS NOTE    Name: Robert Weeks MRN: [id number] DOB: 1940-06-01     LOS: 9   SUBJECTIVE FINDINGS & SIGNIFICANT EVENTS   Patient description:  78 yo hx lymphoma, copd, htn, seizures, resented via EMS with AMS, was diagnosed COVID+ on 01/23/19.  In ED he was hypoxemic but improved nasal canula at 3L/min.  Patient found to have R 4.5 cm pneumothorax, s/p chest tube 81F placement initially at Right with no re-expansion noted on post procedure CXR.  Following this a CT surgery consultation was placed and patient had second chest tube placed 14 F inferoposterior to first one and subsequent CXR confirmed re-expansion. Patient had chest tube placed on -20cm water suction. He was admitted to hospitalist service with critical care consultation placed for further management of above.  Has continued to be encephalopathic, no reexpansion of right lung.   Lines / Drains: PIVx3  Cultures / Sepsis markers: -COVID+  Antibiotics: -Vanco/zosyn-completed -Remdesevir-completed  Events: 12/13- remains on low dose precedex with good response for aggitation and withdrawal. 12/14 Chest tube in place PTX,  12/15-12/16 remains delirious on precedex, chest tube in palce Small air leak still present on expiration from spontaneous pneumothorax 12/17-persistently encephalopathic.  Chest tube with persistent leak no complete reexpansion of his right lung. 12/20 persistently encephalopathic, intermittent air leak chest tube, pneumothorax not expanding  CC  Follow up resp failure  HPI Remains encephalopathic On precedex Prognosis is very poor Persistent intermittent air leak right lung  COVID-19 DISASTER DECLARATION:   FULL CONTACT PHYSICAL EXAMINATION WAS NOT POSSIBLE DUE TO TREATMENT OF COVID-19 AND    CONSERVATION OF PERSONAL PROTECTIVE EQUIPMENT, LIMITED EXAM FINDINGS INCLUDE-   Patient assessed or the symptoms described in the history of present illness.   In the context of the Global COVID-19 pandemic, which necessitated consideration that the patient might be at risk for infection with the SARS-CoV-2 virus that causes COVID-19, Institutional protocols and algorithms that pertain to the evaluation of patients at risk for COVID-19 are in a state of rapid change based on information released by regulatory bodies including the CDC and federal and state organizations. These policies and algorithms were followed during the patient's care while in hospital.  Current Medications:  Current Facility-Administered Medications:  .  0.9 %  sodium chloride infusion, 250 mL, Intravenous, Continuous, Stretch, Marily Lente, MD, Stopped at 02/11/19 1000 .  Chlorhexidine Gluconate Cloth 2 % PADS 6 each, 6 each, Topical, Daily, Nolberto Hanlon, MD, 6 each at 02/12/19 1059 .  dexmedetomidine (PRECEDEX) 400 MCG/100ML (4 mcg/mL) infusion, 0.4-1.2 mcg/kg/hr, Intravenous, Titrated, Stretch, Marily Lente, MD, Last Rate: 8.62 mL/hr at 02/13/19 1543, 0.4 mcg/kg/hr at 02/13/19 1543 .  LORazepam (ATIVAN) injection 0.5-1 mg, 0.5-1 mg, Intravenous, Q4H PRN, Awilda Bill, NP, 1 mg at 02/13/19 0848 .  morphine 2 MG/ML injection 2 mg, 2 mg, Intravenous, Q4H PRN, Tyler Pita, MD .  sodium chloride flush (NS) 0.9 % injection 3 mL, 3 mL, Intravenous, Q12H, Nolberto Hanlon, MD, 3 mL at 02/13/19 0929 .  vitamin C (ASCORBIC ACID) tablet 500 mg, 500 mg, Oral, Daily, Amery, Sahar, MD .  zinc sulfate capsule 220 mg, 220 mg, Oral, Daily, Amery, Sahar, MD  REVIEW OF SYSTEMS  PATIENT IS UNABLE TO PROVIDE COMPLETE REVIEW OF SYSTEM S DUE TO SEVERE CRITICAL ILLNESS AND ENCEPHALOPATHY   PHYSICAL EXAMINATION   Vital Signs: Temp:  [97 F (36.1 C)-98.5 F (36.9 C)] 97.9 F (36.6 C) (12/20 1200) Pulse Rate:  [60-117] 72 (  12/20  1500) Resp:  [12-42] 25 (12/20 1500) BP: (76-138)/(52-103) 121/70 (12/20 1500) SpO2:  [88 %-100 %] 88 % (12/20 1500)  As noted above exam is limited due to full PPE/CAPR With persistent air leak on chest tube.  Chest x-ray persistent pneumothorax on the right, slightly larger  PERTINENT DATA   Infusions: . sodium chloride Stopped (02/11/19 1000)  . dexmedetomidine (PRECEDEX) IV infusion 0.4 mcg/kg/hr (02/13/19 1543)   Scheduled Medications: . Chlorhexidine Gluconate Cloth  6 each Topical Daily  . sodium chloride flush  3 mL Intravenous Q12H  . vitamin C  500 mg Oral Daily  . zinc sulfate  220 mg Oral Daily   PRN Medications: LORazepam, morphine injection Hemodynamic parameters:   Intake/Output: 12/19 0701 - 12/20 0700 In: 3276.3 [I.V.:3054.6; IV Piggyback:221.8] Out: 2745 [Urine:2550; Chest Tube:195]  Ventilator  Settings:     LAB RESULTS:  Basic Metabolic Panel: Recent Labs  Lab 02/08/19 0958 02/08/19 1509 02/09/19 0404 02/10/19 0546 02/12/19 0346 02/13/19 0210  NA 148* 148* 147* 142 141 143  K 5.0 4.6 4.7 5.2* 4.0 3.5  CL 123* 124* 122* 118* 116* 118*  CO2 18* 18* 18* 16* 21* 21*  GLUCOSE 127* 141* 157* 141* 118* 116*  BUN 72* 66* 57* 43* 27* 20  CREATININE 2.42* 2.13* 1.84* 1.57* 1.25* 1.29*  CALCIUM 8.3* 8.3* 8.2* 8.0* 7.8* 7.5*  MG 2.5*  --   --   --  1.6* 2.1  PHOS 3.7  --   --   --  2.6 2.3*   Liver Function Tests: Recent Labs  Lab 02/08/19 0958 02/13/19 0210  AST 18  --   ALT 13  --   ALKPHOS 70  --   BILITOT 1.0  --   PROT 5.6*  --   ALBUMIN 2.2* 1.7*   No results for input(s): LIPASE, AMYLASE in the last 168 hours. Recent Labs  Lab 02/08/19 1059  AMMONIA <9*   CBC: Recent Labs  Lab 02/08/19 0958 02/09/19 0404 02/10/19 0546 02/12/19 0346 02/13/19 0210  WBC 14.8* 14.1* 12.5* 16.3* 21.6*  NEUTROABS 12.4*  --  10.1* 13.9*  --   HGB 12.2* 12.3* 11.9* 12.1* 12.6*  HCT 35.3* 35.3* 36.0* 37.0* 38.2*  MCV 88.9 88.5 91.6 92.3 91.6   PLT 212 212 190 184 194   Cardiac Enzymes: No results for input(s): CKTOTAL, CKMB, CKMBINDEX, TROPONINI in the last 168 hours. BNP: Invalid input(s): POCBNP CBG: No results for input(s): GLUCAP in the last 168 hours.   IMAGING RESULTS:  Imaging: DG Chest Port 1 View  Result Date: 02/13/2019 CLINICAL DATA:  COVID-19.  Pneumothorax EXAM: PORTABLE CHEST 1 VIEW COMPARISON:  Two days ago FINDINGS: Right-sided chest tube with tip in the midline. Large and enlarged right pneumothorax. Per notes the chest tube has been functional, at least in the past. Haziness at the right lower chest likely from pleural fluid. Small left pleural effusion. Bilateral airspace disease. Normal heart size. These results will be called ASAP to the ordering clinician or representative by the Radiologist Assistant, and communication documented in the PACS or zVision Dashboard. IMPRESSION: 1. Increased and large right pneumothorax without mediastinal shift. Chest tube is in unchanged position. 2. Pleural fluid and bilateral airspace disease. Electronically Signed   By: Monte Fantasia M.D.   On: 02/13/2019 07:54     ASSESSMENT AND PLAN   Acute Hypoxic Respiratory Failure COVID-19 late pulmonary phase RIGHT secondary spontaneous pneumothorax Continues with pneumothorax on the RIGHT status post chest tubes x2  Completed remdesivir and Decadron protocol  On 3L/min [place]  Thoracic surgery following- appreciate input  Remains encephalopathic Daughter and son do not wish to have patient get another chest tube, want to transition to comfort care   Alcohol abuse Toxic metabolic encephalopathy [id number] encephalopathy Continue low-dose Precedex agitated if Precedex DC'd Continue thiamine and folate repletion Encephalopathy may be in part due to withdrawal from EtOH Transitioning to comfort care   COPD without exacerbation Bronchodilators, changed to as needed now that on comfort care Has received  corticosteroids, completed  Acute on ChronicRenal Failure-most likely due to ATN Electrolyte imbalance secondary to the same No further lab data as transitioning to comfort care Continue Foley catheter -mostly for end-of-life care   ID Completed antibiotics Completed COVID-19 protocol Continue COVID-19 precautions  GI/Nutrition Unable to take p.o.'s reliably Transitioning to comfort care     Patient's prognosis overall is exceedingly poor.  Son and daughter wish to transition to comfort care, patient will be deescalated from ICU care.  Remains in stepdown due to need for COVID isolation.  He is being followed by Palliative Care, consider transition to hospice in the a.m.     Renold Don, MD [place]    *This note was dictated using voice recognition software/Dragon.  Despite best efforts to proofread, errors can occur which can change the meaning.  Any change was purely unintentional.

## 2019-02-13 NOTE — Progress Notes (Signed)
Nutrition Brief Note  Chart reviewed and discussed with team. Patient now transitioning to comfort care.   No further nutrition interventions warranted at this time. Please re-consult RD as needed.   Jacklynn Barnacle, MS, RD, LDN Office: [phone] Pager: [phone] After Hours/Weekend Pager: [phone]

## 2019-02-13 NOTE — Care Plan (Signed)
I have updated the patient's daughter and surrogate decision maker, Mrs. Burt Ek.  I have notified her that Mr. Jupin has continued to show failure to progress.  Continues to be encephalopathic.  Has a persistent right pneumothorax despite chest tube placement.  I offered to place a larger chest tube to see if we could expand the lung.  Mrs. Melina Copa then conferred with her brother and they are both in agreement that they do not want another chest tube placed on Mr. Griess and would like to transition him to comfort care.  Given his overall poor prognosis and failure to progress at all during his hospitalization will proceed with transitioning him to comfort care.  He is DNR.  He is being followed by Palliative Care hopefully he can be transitioned to hospice.   Renold Don, MD [place]

## 2019-02-14 MED ORDER — LORAZEPAM 2 MG/ML IJ SOLN
2.0000 mg | INTRAMUSCULAR | Status: DC | PRN
  Administered 2019-02-14: 4 mg via INTRAVENOUS
  Administered 2019-02-14: 2 mg via INTRAVENOUS
  Filled 2019-02-14: qty 1
  Filled 2019-02-14: qty 2

## 2019-02-14 MED ORDER — MORPHINE 100MG IN NS 100ML (1MG/ML) PREMIX INFUSION
0.0000 mg/h | INTRAVENOUS | Status: DC
  Administered 2019-02-14: 2 mg/h via INTRAVENOUS
  Filled 2019-02-14: qty 100

## 2019-02-14 NOTE — Progress Notes (Signed)
Updated sister Robbie Lis about patient current's condition. Patient is currently resting without any sings of pain. Patient's breathing is labored and rapid.

## 2019-02-14 NOTE — Progress Notes (Signed)
Clinical status relayed to family  Updated and notified of patients medical condition-  Progressive multiorgan failure with very low chance of meaningful recovery.  Patient is in dying  Process.  Family understands the situation.  They have consented and agreed to DNR/DNI and would like to proceed with Comfort care measures.   Family are satisfied with Plan of action and management. All questions answered  Corrin Parker, M.D.  [facility]  Medical Director [place] Director [facility] Cardio-Pulmonary Department

## 2019-02-14 NOTE — Progress Notes (Signed)
Received report from [facility] from ICU. Patient transferred to room 233. Patient appears to be resting comfortably in bed.

## 2019-02-15 DIAGNOSIS — Z515 Encounter for palliative care: Secondary | ICD-10-CM

## 2019-02-25 NOTE — Progress Notes (Signed)
Palliative: Mr. Borre is now comfort measures only.  Covid positive, conference with bedside nursing staff related to patient condition, needs, symptoms.  He is no longer eating and drinking.  No family present at this time.  Call to daughter, Burt Ek at [phone].  We talk about comfort measures and review some vital signs.  Haywood Pao shares that she was in [place] this weekend and was able to video with Mr. Watford. She has now returned to [place].  Haywood Pao shares her concern about her father's recent "hallucinations" and questionable Parkinson's disease.  She shares that she wonders if he was ever tested for Parkinson's, sharing that they have a family history.  Support provided, PMT to follow.  Plan:  FULL COMFORT care.  Anticipate hospital death.  52 minutes Quinn Axe, NP Palliative Medicine Team Team Phone # [phone] Greater than 50% of this time was spent counseling and coordinating care related to the above assessment and plan.

## 2019-02-25 NOTE — Plan of Care (Signed)
  Problem: Pain Managment: Goal: General experience of comfort will improve Outcome: Progressing   Problem: Education: Goal: Knowledge of General Education information will improve Description: Including pain rating scale, medication(s)/side effects and non-pharmacologic comfort measures Outcome: Not Progressing   Problem: Health Behavior/Discharge Planning: Goal: Ability to manage health-related needs will improve Outcome: Not Progressing   Problem: Activity: Goal: Risk for activity intolerance will decrease Outcome: Not Progressing   Problem: Nutrition: Goal: Adequate nutrition will be maintained Outcome: Not Progressing   Problem: Education: Goal: Knowledge of the prescribed therapeutic regimen will improve Outcome: Not Progressing

## 2019-02-25 NOTE — Progress Notes (Signed)
Call to daughter, Burt Ek at [phone]. Notified Daughter of Time of death 11:10AM.

## 2019-02-25 NOTE — Death Summary Note (Signed)
DEATH SUMMARY   Patient Details  Name: Robert Weeks MRN: [id number] DOB: 04/17/1940  Admission/Discharge Information   Admit Date:  2019-03-03  Date of Death: Date of Death: 2019-03-14  Time of Death: Time of Death: 06/12/08  Length of Stay: 06/01/22  Referring Physician: Baxter Hire, MD   Reason(s) for Hospitalization  COVID 19 INFECTION  PNEUMONIA PNEUMOTHORAX  Diagnoses  Preliminary cause of death: COVID 40 INFECTION PNEUMONIA, PNEUMOTHORAX, ETOH ABUSE Secondary Diagnoses (including complications and co-morbidities):  Active Problems:   Pneumothorax   Goals of care, counseling/discussion   Palliative care by specialist   End of life care  Clinical status relayed to family  Updated and notified of patients medical condition-  Progressive multiorgan failure with very low chance of meaningful recovery.  Patient is in dying  Process.  Family understands the situation.  They have consented and agreed to DNR/DNI and would like to proceed with Comfort care measures.   Family are satisfied with Plan of action and management. All questions answered   Patient description:  79 yo hx lymphoma, copd, htn, seizures, resented via EMS with AMS, was diagnosed COVID+ on 01/23/19. In ED he was hypoxemic but improved nasal canula at 3L/min. Patient found to have R 4.5 cm pneumothorax, s/p chest tube 59F placement initially at Right with no re-expansion noted on post procedure CXR. Following this a CT surgery consultation was placed and patient had second chest tube placed 14 F inferoposterior to first one and subsequent CXR confirmed re-expansion. Patient had chest tube placed on -20cm water suction. He was admitted to hospitalist service with critical care consultation placed for further management of above. Has continued to be encephalopathic, no reexpansion of right lung.  Lines / Drains:  PIVx3  Cultures / Sepsis markers:  -COVID+  Antibiotics:  -Vanco/zosyn - stopped now on  zithromax/rocephin  -Remdesevir  Events:  12/13- remains on low dose precedex with good response for aggitation and withdrawal.  12/14 Chest tube in place PTX,  12/15-12/16 remains delirious on precedex, chest tube in palce  Small air leak still present on expiration from spontaneous pneumothorax  12/17-persistently encephalopathic. Chest tube with persistent leak no complete reexpansion of his right lung.  CC  Follow up resp failure  HPI  Remains encephalopathic  On precedex  Prognosis is very poor  Persistent intermittent air leak right lung  COVID-19 DISASTER DECLARATION:  FULL CONTACT PHYSICAL EXAMINATION WAS NOT POSSIBLE DUE TO TREATMENT OF COVID-19 AND  CONSERVATION OF PERSONAL PROTECTIVE EQUIPMENT, LIMITED EXAM FINDINGS INCLUDE-  Patient assessed or the symptoms described in the history of present illness.  In the context of the Global COVID-19 pandemic, which necessitated consideration that the patient might be at risk for infection with the SARS-CoV-2 virus that causes COVID-19, Institutional protocols and algorithms that pertain to the evaluation of patients at risk for COVID-19 are in a state of rapid change based on information released by regulatory bodies including the CDC and federal and state organizations. These policies and algorithms were followed during the patient's care while in hospital.  Current Medications:   Current Facility-Administered Medications:  . 0.9 % sodium chloride infusion, 250 mL, Intravenous, Continuous, Stretch, Marily Lente, MD, Stopped at 02/11/19 1000  . Chlorhexidine Gluconate Cloth 2 % PADS 6 each, 6 each, Topical, Daily, Nolberto Hanlon, MD, 6 each at 02/12/19 1059  . dexmedetomidine (PRECEDEX) 400 MCG/100ML (4 mcg/mL) infusion, 0.4-1.2 mcg/kg/hr, Intravenous, Titrated, Stretch, Marily Lente, MD, Stopped at 02/12/19 1306  . dextrose 5 % in lactated  ringers infusion, , Intravenous, Continuous, Tyler Pita, MD, Last Rate: 20 mL/hr at 02/12/19 1557,  New Bag at 02/12/19 1557  . folic acid injection 1 mg, 1 mg, Intravenous, Daily, Tanise Russman, MD, 1 mg at 02/12/19 1055  . heparin injection 5,000 Units, 5,000 Units, Subcutaneous, Q8H, Thornell Mule, MD, 5,000 Units at 02/12/19 1436  . lamoTRIgine (LAMICTAL) tablet 150 mg, 150 mg, Oral, BID, Kurtis Bushman, Sahar, MD  . levETIRAcetam (KEPPRA) 750 mg in sodium chloride 0.9 % 100 mL IVPB, 750 mg, Intravenous, Q12H, Flora Lipps, MD, Stopped at 02/12/19 1925  . LORazepam (ATIVAN) injection 0.5-1 mg, 0.5-1 mg, Intravenous, Q4H PRN, Awilda Bill, NP, 1 mg at 02/12/19 1553  . LORazepam (ATIVAN) injection 2 mg, 2 mg, Intravenous, Once, Darel Hong D, NP  . morphine 2 MG/ML injection 2 mg, 2 mg, Intravenous, Q4H PRN, Flora Lipps, MD, 2 mg at 02/12/19 0230  . multivitamin with minerals tablet 1 tablet, 1 tablet, Oral, Daily, Amery, Sahar, MD  . phenylephrine (NEO-SYNEPHRINE) 10 mg in sodium chloride 0.9 % 250 mL (0.04 mg/mL) infusion, 0-400 mcg/min, Intravenous, Titrated, Darel Hong D, NP, Last Rate: 30 mL/hr at 02/12/19 2015, 20 mcg/min at 02/12/19 2015  . sodium chloride flush (NS) 0.9 % injection 3 mL, 3 mL, Intravenous, Q12H, Amery, Sahar, MD, 3 mL at 02/12/19 1059  . tamsulosin (FLOMAX) capsule 0.4 mg, 0.4 mg, Oral, Daily, Amery, Sahar, MD  . thiamine (B-1) injection 100 mg, 100 mg, Intravenous, Daily, Haitham Dolinsky, MD, 100 mg at 02/12/19 1055  . vitamin C (ASCORBIC ACID) tablet 500 mg, 500 mg, Oral, Daily, Amery, Sahar, MD  . zinc sulfate capsule 220 mg, 220 mg, Oral, Daily, Kurtis Bushman, Sahar, MD  REVIEW OF SYSTEMS  PATIENT IS UNABLE TO PROVIDE COMPLETE REVIEW OF SYSTEM S DUE TO SEVERE CRITICAL ILLNESS AND ENCEPHALOPATHY  PHYSICAL EXAMINATION  Vital Signs:  Temp: [96.9 F (36.1 C)-97.9 F (36.6 C)] 97.9 F (36.6 C) (12/19 2000)  Pulse Rate: [43-69] 67 (12/19 2000)  Resp: [10-27] 20 (12/19 2000)  BP: (64-140)/(45-111) 106/59 (12/19 2000)  SpO2: [84 %-99 %] 94 % (12/19 2000)  As noted above  exam is limited due to full PPE/CAPR  With persistent air leak on chest tube.  Chest x-ray persistent pneumothorax on the right, slightly larger  PERTINENT DATA  Infusions:  . sodium chloride Stopped (02/11/19 1000)  . dexmedetomidine (PRECEDEX) IV infusion Stopped (02/12/19 1306)  . dextrose 5% lactated ringers 20 mL/hr at 02/12/19 1557  . levETIRAcetam Stopped (02/12/19 1925)  . phenylephrine (NEO-SYNEPHRINE) Adult infusion 20 mcg/min (02/12/19 2015)   Scheduled Medications:  . Chlorhexidine Gluconate Cloth 6 each Topical Daily  . folic acid 1 mg Intravenous Daily  . heparin injection (subcutaneous) 5,000 Units Subcutaneous Q8H  . lamoTRIgine 150 mg Oral BID  . LORazepam 2 mg Intravenous Once  . multivitamin with minerals 1 tablet Oral Daily  . sodium chloride flush 3 mL Intravenous Q12H  . tamsulosin 0.4 mg Oral Daily  . thiamine injection 100 mg Intravenous Daily  . vitamin C 500 mg Oral Daily  . zinc sulfate 220 mg Oral Daily   PRN Medications:  LORazepam, morphine injection  Hemodynamic parameters:   Intake/Output:  12/18 0701 - 12/19 0700  In: 3143.5 [I.V.:2927.5; IV Piggyback:216]  Out: 1860 [Urine:1725; Chest Tube:135]  Ventilator Settings:  >    LAB RESULTS:  Basic Metabolic Panel:  Last Labs  Liver Function Tests:  Last Labs                                               Last Labs      Last Labs                  CBC:  Last Labs                                                              Cardiac Enzymes:  Last Labs     BNP:  Last Labs     CBG:  Last Labs                  IMAGING RESULTS:  Imaging:  Imaging Results (Last 48 hours)     ASSESSMENT AND PLAN  Acute Hypoxic Respiratory Failure  COVID-19 late pulmonary phase  Continues with pneumothorax on the RIGHT status post chest tubes x2   Completed remdesivir and Decadron protocol  On 3L/min [place]  Thoracic surgery following- appreciate input  Remains encephalopathic  Alcohol abuse  Toxic metabolic encephalopathy  RDEYC-14 encephalopathy  Continue low-dose Precedex  Continue thiamine and folate repletion  Encephalopathy may be in part due to withdrawal  COPD without exacerbation  Bronchodilators  Has received corticosteroids, completed  Acute on Chronic Renal Failure-most likely due to ATN  Electrolyte imbalance secondary to the same  -follow chem 7  -follow UO  -continue Foley Catheter-assess need daily  ID  Completed antibiotics  Completed COVID-19 protocol  Continue COVID-19 precautions  GI/Nutrition  Unable to take p.o.'s reliably  Hopefully can start feeding p.o.  May need TPN versus postpyloric feeding tube placement  ENDO  - ICU hypoglycemic\Hyperglycemia protocol  -check FSBS per protocol  DVT/GI PRX ordered  -SCDs  TRANSFUSIONS AS NEEDED  MONITOR FSBS  ASSESS the need for LABS as needed       Pertinent Labs and Studies  Significant Diagnostic Studies DG Chest 1 View  Result Date: 02/02/2019 CLINICAL DATA:  Status post right chest tube for a right pneumothorax. EXAM: CHEST  1 VIEW COMPARISON:  02/14/2019 at 4:12 p.m. FINDINGS: New pigtail chest tube has placed, extending across the lower hemithorax, curled tip projecting in the medial right hemithorax 3-4 cm below the level of the carina. No change in the smaller caliber chest tube with its tip projecting over the right upper lung. No residual right pneumothorax. Interstitial and hazy airspace lung opacities at the bases, most evident on the left, are unchanged from the earlier exam. No new lung abnormalities. IMPRESSION: 1. Re-expansion of the right lung following the placement of a second chest tube. No residual pneumothorax noted. Electronically Signed   By: Lajean Manes M.D.   On: 02/23/2019 17:24   DG Chest 1 View  Result Date:  01/31/2019 CLINICAL DATA:  Right pneumothorax. Chest tube placement. EXAM: CHEST  1 VIEW 4:30 p.m. COMPARISON:  Radiographs dated 02/19/2019 at 2:53 p.m. and 1:35 p.m. and 01/23/2019 FINDINGS: There has been a slight decrease in the pneumothorax at the right lung base. This is minimal. Chest tube is unchanged in position. No shift of the mediastinal structures to  the left to suggest tension. Hazy infiltrate at the left base is slightly more prominent than on the prior study. Heart size and vascularity are normal. IMPRESSION: 1. Slight decrease in the large right pneumothorax at the right lung base. 2. Increased hazy infiltrate at the left base. 3. No change in the right chest tube. Electronically Signed   By: Lorriane Shire M.D.   On: 02/03/2019 16:47   DG Chest 1 View  Result Date: 02/06/2019 CLINICAL DATA:  Right chest tube placement for right pneumothorax. EXAM: CHEST  1 VIEW COMPARISON:  February 04, 2019 FINDINGS: Right chest tube is noted with tip in the right upper chest. Moderate right pneumothorax is unchanged. Patchy opacity noted in left lung base unchanged. The mediastinal contour and cardiac silhouetteare normal. IMPRESSION: Right chest tube is noted with tip in the right upper chest. Moderate right pneumothorax is unchanged. Patchy opacity noted in left lung base unchanged. Electronically Signed   By: Abelardo Diesel M.D.   On: 02/12/2019 15:22   DG Chest 2 View  Result Date: 01/23/2019 CLINICAL DATA:  Productive cough and shortness of breath. EXAM: CHEST - 2 VIEW COMPARISON:  07/24/2015 chest x-ray. CT of the chest on 07/09/2016 FINDINGS: Stable heart size and aortic tortuosity. Interval removal of Port-A-Cath. Mild density of both lung bases most likely represents atelectasis. Subtle early pneumonia cannot be entirely excluded. There is no evidence of pulmonary edema, pneumothorax, nodule or pleural fluid. IMPRESSION: Mild density of both lung bases most likely represents atelectasis. Subtle  early pneumonia cannot be entirely excluded. Electronically Signed   By: Aletta Edouard M.D.   On: 01/23/2019 09:08   DG Chest Port 1 View  Result Date: 02/13/2019 CLINICAL DATA:  COVID-19.  Pneumothorax EXAM: PORTABLE CHEST 1 VIEW COMPARISON:  Two days ago FINDINGS: Right-sided chest tube with tip in the midline. Large and enlarged right pneumothorax. Per notes the chest tube has been functional, at least in the past. Haziness at the right lower chest likely from pleural fluid. Small left pleural effusion. Bilateral airspace disease. Normal heart size. These results will be called ASAP to the ordering clinician or representative by the Radiologist Assistant, and communication documented in the PACS or zVision Dashboard. IMPRESSION: 1. Increased and large right pneumothorax without mediastinal shift. Chest tube is in unchanged position. 2. Pleural fluid and bilateral airspace disease. Electronically Signed   By: Monte Fantasia M.D.   On: 02/13/2019 07:54   DG Chest Port 1 View  Result Date: 02/11/2019 CLINICAL DATA:  Pneumothorax. EXAM: PORTABLE CHEST 1 VIEW COMPARISON:  One-view chest x-ray 02/08/2019 FINDINGS: The heart size is normal. Right-sided pneumothorax is increasing. There is likely some pleural fluid on the right as well. Left basilar airspace disease has progressed some. Right-sided chest tube remains in place, fairly medial. IMPRESSION: 1. Increasing right-sided pneumothorax despite stable appearance of the right-sided chest tube. 2. Progressive left lower lobe airspace disease concerning for infection. 3. These results will be called to the ordering clinician or representative by the Radiologist Assistant, and communication documented in the PACS or zVision Dashboard. Electronically Signed   By: San Morelle M.D.   On: 02/11/2019 05:49   DG Chest Port 1 View  Result Date: 02/08/2019 CLINICAL DATA:  Pneumothorax EXAM: PORTABLE CHEST 1 VIEW COMPARISON:  Portable exam at 1123  hours compared to 02/07/2019 FINDINGS: Pigtail RIGHT thoracostomy tube again identified with pigtail at midline. Enlargement of cardiac silhouette. Atherosclerotic calcification aorta. Mediastinal contours and pulmonary vascularity normal. Bibasilar atelectasis. Small loculated  RIGHT basilar and minimal RIGHT apical pneumothorax. Remaining lungs clear. No significant pleural effusion. Bones demineralized. IMPRESSION: Persistent RIGHT apical and basilar pneumothorax despite thoracostomy tube. Bibasilar atelectasis. Electronically Signed   By: Lavonia Dana M.D.   On: 02/08/2019 11:30   DG CHEST PORT 1 VIEW  Result Date: 02/07/2019 CLINICAL DATA:  Pneumothorax EXAM: PORTABLE CHEST 1 VIEW COMPARISON:  02/05/2019 FINDINGS: Right chest tube is again noted. Similar small right pneumothorax. Similar patchy density at the lung bases and trace pleural effusions. Stable cardiomediastinal contours. IMPRESSION: Similar small right pneumothorax with chest tube unchanged in position. Similar patchy bibasilar opacities and trace pleural effusions. Electronically Signed   By: Macy Mis M.D.   On: 02/07/2019 08:35   DG Chest Port 1 View  Result Date: 02/05/2019 CLINICAL DATA:  Pneumothorax EXAM: PORTABLE CHEST 1 VIEW COMPARISON:  February 05, 2019 FINDINGS: There is a right-sided chest tube in place. There is a persistent small right-sided pneumothorax. There is a persistent opacity at the left lung base. The heart size is unchanged from prior study. There is no left-sided pneumothorax. There is no displaced fracture. IMPRESSION: 1. Stable small right-sided pneumothorax. Unchanged positioning of the right-sided chest tube. 2. Persistent opacity at the left lung base. Electronically Signed   By: Constance Holster M.D.   On: 02/05/2019 22:12   DG CHEST PORT 1 VIEW  Result Date: 02/05/2019 CLINICAL DATA:  79 year old male with right chest tube for pneumothorax. Positive COVID-19 last my. EXAM: PORTABLE CHEST 1 VIEW  COMPARISON:  02/05/2019 and earlier. FINDINGS: Portable AP upright views at 1026 hours. Pigtail right chest tube courses to the midline as before. However, recurrent small pneumothorax is visible at the right lung base today. Pleural edge is best seen at the lateral right 7th, 8th rib level. Stable lung volumes and mediastinal contours. No confluent opacity on the right. Patchy left lung base opacity has not significantly changed. Visualized tracheal air column is within normal limits. Negative visible bowel gas pattern. IMPRESSION: 1. Recurrent small right basilar pneumothorax. Right chest tube remains in place. 2. Stable patchy left lung base opacity. Electronically Signed   By: Genevie Ann M.D.   On: 02/05/2019 12:29   DG Chest Port 1 View  Result Date: 02/13/2019 CLINICAL DATA:  Patient from [facility], sis called ems due to patient feeling week and sob. Patient reports sob, sating low 80"s as per ems. Presents on 3lnc satings soft 90's in no obvious distress. Covid+, diagnosed on 11/29 EXAM: PORTABLE CHEST 1 VIEW COMPARISON:  01/23/2019 and older studies. FINDINGS: Moderate-sized right pneumothorax. Interstitial thickening is noted in the lower lungs, increased when compared to the prior chest radiographs. Remainder of the lungs is clear. Cardiac silhouette is normal in size. No mediastinal or hilar masses. Skeletal structures are grossly intact. IMPRESSION: 1. Moderate-sized right pneumothorax. No evidence of tension pneumothorax. Critical Value/emergent results were called by telephone at the time of interpretation on 02/16/2019 at 2:06 pm to [facility] , who verbally acknowledged these results. 2. Thickened interstitial markings in the lung bases, increased from the prior studies. Interstitial infection or inflammation is suspected. Electronically Signed   By: Lajean Manes M.D.   On: 01/29/2019 14:07    Microbiology No results found for this or any previous visit (from the past  240 hour(s)).  Lab Basic Metabolic Panel: Recent Labs  Lab 02/08/19 1509 02/09/19 0404 02/10/19 0546 02/10/19 2048 02/12/19 0346 02/13/19 0210  NA 148* 147* 142  --  141 143  K 4.6 4.7 5.2* 4.1 4.0 3.5  CL 124* 122* 118*  --  116* 118*  CO2 18* 18* 16*  --  21* 21*  GLUCOSE 141* 157* 141*  --  118* 116*  BUN 66* 57* 43*  --  27* 20  CREATININE 2.13* 1.84* 1.57*  --  1.25* 1.29*  CALCIUM 8.3* 8.2* 8.0*  --  7.8* 7.5*  MG  --   --   --   --  1.6* 2.1  PHOS  --   --   --   --  2.6 2.3*   Liver Function Tests: Recent Labs  Lab 02/13/19 0210  ALBUMIN 1.7*   No results for input(s): LIPASE, AMYLASE in the last 168 hours. No results for input(s): AMMONIA in the last 168 hours. CBC: Recent Labs  Lab 02/09/19 0404 02/10/19 0546 02/12/19 0346 02/13/19 0210  WBC 14.1* 12.5* 16.3* 21.6*  NEUTROABS  --  10.1* 13.9*  --   HGB 12.3* 11.9* 12.1* 12.6*  HCT 35.3* 36.0* 37.0* 38.2*  MCV 88.5 91.6 92.3 91.6  PLT 212 190 184 194   Cardiac Enzymes: No results for input(s): CKTOTAL, CKMB, CKMBINDEX, TROPONINI in the last 168 hours. Sepsis Labs: Recent Labs  Lab 02/09/19 0404 02/10/19 0546 02/12/19 0346 02/13/19 0210  PROCALCITON <0.10  --   --   --   WBC 14.1* 12.5* 16.3* 21.6*     Retina Bernardy 03-09-19, 11:39 AM

## 2019-02-25 NOTE — Care Management Important Message (Signed)
Important Message  Patient Details  Name: Robert Weeks MRN: [id number] Date of Birth: 03-20-1940   Medicare Important Message Given:  Other (see comment)  12/22 - Placed on comfort care measures. Out of respect of patient and family no IM given.   Juliann Pulse A Robert Weeks 02-23-2019, 8:46 AM

## 2019-02-25 NOTE — Progress Notes (Signed)
Continue Comfort care measures Continue morphine infusion

## 2019-02-25 NOTE — Progress Notes (Addendum)
Pronounced death at 11:10am by this RN and Youlanda Mighty. MD notified and will contact family.

## 2019-02-25 DEATH — deceased

## 2019-03-09 ENCOUNTER — Other Ambulatory Visit: Payer: Medicare Other

## 2019-03-09 ENCOUNTER — Ambulatory Visit: Payer: Medicare Other | Admitting: Hematology and Oncology

## 2019-11-11 ENCOUNTER — Ambulatory Visit (INDEPENDENT_AMBULATORY_CARE_PROVIDER_SITE_OTHER): Payer: Medicare Other | Admitting: Vascular Surgery

## 2019-11-11 ENCOUNTER — Other Ambulatory Visit (INDEPENDENT_AMBULATORY_CARE_PROVIDER_SITE_OTHER): Payer: Medicare Other
